# Patient Record
Sex: Male | Born: 1970 | Race: White | Hispanic: No | Marital: Married | Smoking: Former smoker
Health system: Southern US, Community
[De-identification: ages and names within clinical notes are randomized; demographics above are authoritative.]

## PROBLEM LIST (undated history)

## (undated) ENCOUNTER — Emergency Department (HOSPITAL_COMMUNITY): Admission: EM | Disposition: A | Discharge status: Other Acute Inpatient Hospital | Payer: BC Managed Care – PPO

## (undated) DIAGNOSIS — E119 Type 2 diabetes mellitus without complications: Secondary | ICD-10-CM

## (undated) DIAGNOSIS — I10 Essential (primary) hypertension: Secondary | ICD-10-CM

## (undated) DIAGNOSIS — M109 Gout, unspecified: Secondary | ICD-10-CM

## (undated) DIAGNOSIS — D229 Melanocytic nevi, unspecified: Secondary | ICD-10-CM

## (undated) DIAGNOSIS — G4730 Sleep apnea, unspecified: Secondary | ICD-10-CM

## (undated) DIAGNOSIS — E669 Obesity, unspecified: Secondary | ICD-10-CM

## (undated) DIAGNOSIS — F329 Major depressive disorder, single episode, unspecified: Secondary | ICD-10-CM

## (undated) DIAGNOSIS — R011 Cardiac murmur, unspecified: Secondary | ICD-10-CM

## (undated) DIAGNOSIS — E785 Hyperlipidemia, unspecified: Secondary | ICD-10-CM

## (undated) DIAGNOSIS — D352 Benign neoplasm of pituitary gland: Secondary | ICD-10-CM

## (undated) DIAGNOSIS — F419 Anxiety disorder, unspecified: Secondary | ICD-10-CM

## (undated) DIAGNOSIS — F32A Depression, unspecified: Secondary | ICD-10-CM

## (undated) HISTORY — PX: CHOLECYSTECTOMY: SHX55

## (undated) HISTORY — PX: UVULOPALATOPHARYNGOPLASTY: SHX827

## (undated) HISTORY — DX: Hyperlipidemia, unspecified: E78.5

## (undated) HISTORY — PX: WISDOM TOOTH EXTRACTION: SHX21

## (undated) HISTORY — PX: TONSILLECTOMY AND ADENOIDECTOMY: SHX28

## (undated) HISTORY — PX: TONSILLECTOMY: SUR1361

## (undated) HISTORY — DX: Melanocytic nevi, unspecified: D22.9

---

## 1999-03-14 ENCOUNTER — Encounter: Payer: Self-pay | Admitting: Endocrinology

## 1999-03-14 ENCOUNTER — Ambulatory Visit (HOSPITAL_COMMUNITY): Admission: RE | Admit: 1999-03-14 | Discharge: 1999-03-14 | Payer: Self-pay | Admitting: Endocrinology

## 1999-09-10 ENCOUNTER — Ambulatory Visit: Admission: RE | Admit: 1999-09-10 | Discharge: 1999-09-10 | Payer: Self-pay | Admitting: Endocrinology

## 2000-01-09 ENCOUNTER — Encounter: Admission: RE | Admit: 2000-01-09 | Discharge: 2000-04-08 | Payer: Self-pay | Admitting: Pulmonary Disease

## 2000-05-15 ENCOUNTER — Encounter: Payer: Self-pay | Admitting: Otolaryngology

## 2000-05-16 ENCOUNTER — Ambulatory Visit (HOSPITAL_COMMUNITY): Admission: RE | Admit: 2000-05-16 | Discharge: 2000-05-17 | Payer: Self-pay | Admitting: Otolaryngology

## 2000-08-05 ENCOUNTER — Ambulatory Visit (HOSPITAL_BASED_OUTPATIENT_CLINIC_OR_DEPARTMENT_OTHER): Admission: RE | Admit: 2000-08-05 | Discharge: 2000-08-05 | Payer: Self-pay | Admitting: Otolaryngology

## 2001-12-07 ENCOUNTER — Encounter: Payer: Self-pay | Admitting: Endocrinology

## 2001-12-07 ENCOUNTER — Ambulatory Visit (HOSPITAL_COMMUNITY): Admission: RE | Admit: 2001-12-07 | Discharge: 2001-12-07 | Payer: Self-pay | Admitting: Endocrinology

## 2003-04-13 ENCOUNTER — Ambulatory Visit (HOSPITAL_COMMUNITY): Admission: RE | Admit: 2003-04-13 | Discharge: 2003-04-13 | Payer: Self-pay | Admitting: Endocrinology

## 2004-06-21 ENCOUNTER — Ambulatory Visit: Payer: Self-pay | Admitting: Internal Medicine

## 2005-04-14 ENCOUNTER — Ambulatory Visit (HOSPITAL_COMMUNITY): Admission: RE | Admit: 2005-04-14 | Discharge: 2005-04-14 | Payer: Self-pay | Admitting: Endocrinology

## 2007-07-25 ENCOUNTER — Emergency Department: Payer: Self-pay | Admitting: Emergency Medicine

## 2007-11-19 ENCOUNTER — Ambulatory Visit: Payer: Self-pay | Admitting: Endocrinology

## 2007-12-16 ENCOUNTER — Ambulatory Visit: Payer: Self-pay | Admitting: Family Medicine

## 2008-02-22 ENCOUNTER — Ambulatory Visit: Payer: Self-pay | Admitting: Emergency Medicine

## 2008-02-22 DIAGNOSIS — D352 Benign neoplasm of pituitary gland: Secondary | ICD-10-CM | POA: Insufficient documentation

## 2008-02-22 DIAGNOSIS — G4733 Obstructive sleep apnea (adult) (pediatric): Secondary | ICD-10-CM | POA: Insufficient documentation

## 2008-02-22 DIAGNOSIS — Z85858 Personal history of malignant neoplasm of other endocrine glands: Secondary | ICD-10-CM | POA: Insufficient documentation

## 2008-02-22 DIAGNOSIS — I10 Essential (primary) hypertension: Secondary | ICD-10-CM | POA: Insufficient documentation

## 2008-02-22 DIAGNOSIS — G4730 Sleep apnea, unspecified: Secondary | ICD-10-CM | POA: Insufficient documentation

## 2008-03-09 ENCOUNTER — Encounter: Payer: Self-pay | Admitting: Emergency Medicine

## 2008-03-21 ENCOUNTER — Encounter: Payer: Self-pay | Admitting: Emergency Medicine

## 2008-04-20 ENCOUNTER — Encounter: Payer: Self-pay | Admitting: Emergency Medicine

## 2008-08-09 ENCOUNTER — Ambulatory Visit: Payer: Self-pay | Admitting: Gastroenterology

## 2008-09-26 ENCOUNTER — Ambulatory Visit: Payer: Self-pay | Admitting: Surgery

## 2010-07-26 ENCOUNTER — Other Ambulatory Visit: Payer: Self-pay | Admitting: Endocrinology

## 2010-07-26 DIAGNOSIS — D497 Neoplasm of unspecified behavior of endocrine glands and other parts of nervous system: Secondary | ICD-10-CM

## 2010-08-01 ENCOUNTER — Other Ambulatory Visit: Payer: Self-pay | Admitting: Endocrinology

## 2010-08-01 DIAGNOSIS — D497 Neoplasm of unspecified behavior of endocrine glands and other parts of nervous system: Secondary | ICD-10-CM

## 2010-08-07 ENCOUNTER — Ambulatory Visit
Admission: RE | Admit: 2010-08-07 | Discharge: 2010-08-07 | Disposition: A | Discharge status: Home / Self Care | Payer: 59 | Source: Ambulatory Visit | Attending: Endocrinology | Admitting: Endocrinology

## 2010-08-07 DIAGNOSIS — D497 Neoplasm of unspecified behavior of endocrine glands and other parts of nervous system: Secondary | ICD-10-CM

## 2010-08-07 MED ORDER — GADOBENATE DIMEGLUMINE 529 MG/ML IV SOLN
10.0000 mL | Freq: Once | INTRAVENOUS | Status: AC | PRN
  Administered 2010-08-07: 10 mL via INTRAVENOUS

## 2010-09-14 NOTE — Op Note (Signed)
[place]. [facility]  Patient:    Jacob [name], Jacob [name]                    MRN: [id number] Proc. Date: 05/16/00 Adm. Date:  [id number] Disc. Date: [id number] Attending:  Barbee Cough                           Operative Report  PREOPERATIVE DIAGNOSES: 1. Obstructive sleep apnea. 2. Chronic nasal obstruction. 3. Deviated nasal septum. 4. Uvulopalatal hypertrophy.  POSTOPERATIVE DIAGNOSES: 1. Obstructive sleep apnea. 2. Chronic nasal obstruction. 3. Deviated nasal septum. 4. Uvulopalatal hypertrophy.  SURGICAL PROCEDURE: 1. Uvulopalatal pharyngoplasty. 2. Nasal septoplasty. 3. Bilateral inferior turbinate intramural cautery.  ANESTHESIA:  General endotracheal.  SURGEON:  Kinnie Scales. Annalee Genta, M.D.  COMPLICATIONS:  None.  ESTIMATED BLOOD LOSS:  Minimal.  DISPOSITION:  The patient transferred from the operating room to the recovery room in stable condition.  BRIEF HISTORY:  The patient is a 40 year old white male referred by Dr. Billy Fischer for surgical evaluation of obstructive sleep apnea.  The patient had a longstanding history of progressive nighttime snoring, and an inpatient sleep study was performed which showed an RDI of 28 ______ per hour with O2 nadir of 79%.  The patient was titrated on nasal CPAP to a pressure setting of 8 cmH2O.  He used this consistently over a three to four month period of time, but had significant difficulty with complaints of discomfort, pressure, and claustrophobia while using the device.  Examination in my office revealed a deviated nasal septum with nasal obstruction, turbinate hypertrophy.  The patient was also found to have uvulopalatal hypertrophy and to be status post tonsillectomy.  Given the patients history, examination, and failure to tolerate long-term CPAP, I recommended that we undertake surgical management of obstructive sleep apnea.  The above procedures were discussed in detail and the risks,  benefits, and possible complications were discussed with the patient and his wife, who understood and concurred with our plan for surgery.  DESCRIPTION OF PROCEDURE:  The patient was brought to the operating room on May 16, 2000, and placed in the supine position on the operating table. General endotracheal anesthesia was established without difficulty.  When the patient was adequately anesthetized, the nasal cavity was injected with 8 cc of 1% lidocaine, 1:100,000 solution epinephrine.  It was injected in a submucosal fashion along the nasal septum and inferior turbinates bilaterally, and the patients nose packed with Afrin soaked cottonoid pledgets.  The patient was then prepped and draped in a sterile fashion.  Pledgets were left intact for approximately 10 minutes to allow for vasoconstriction.  The surgical procedure was begun by creating a right hemitransfixion incision. This was carried through the mucosa and underlying submucosa, and a mucoperichondrial flap was elevated from anterior to posterior.  The patient was found to have a significantly deviated septum with inferior septal spurring and obstruction.  The bony cartilaginous junction was crossed at the midline and a mucoperiosteal flap was elevated in the posterior aspect of the left nasal septum.  The anterior nasal septal cartilage was left intact.  The mid septal cartilage was resected using a swivel knife.  This was morcellized and returned to the mucoperichondrial pocket at the end of the surgical procedure.  Deviated bone and cartilage and inferior septal spur were resected using a 4 mm osteotome.  The patients nasal septum was midline and morcellized cartilage was returned to the mucoperichondrial  pocket, and the mucoperichondrial flaps were reapproximated using a 4-0 gut suture on a Keith needle in a horizontal mattressing fashion.  A hemitransfixion incision was placed close to the same stitch.  The nasal cavity  was then irrigated and bilateral nasal septal splints were placed.  Doyle splints were placed on each side of the nasal septum after application of Bactroban ointment and sutured into position with a 3-0 Ethilon suture.  Inferior turbinate intramural cautery was then performed with the cautery set at 12 watts.  Two passes were made in the submucosal fascia in each inferior turbinate.  When the ________ had been adequately cauterized, they were outfractured and appeared a more patent nasal cavity.  Uvulopalatopharyngoplasty was then performed.  A Crowe-Davis mouthgag was inserted without difficulty.  There were no loose or broken teeth and the hard and soft palate were intact.  The patient had undergone previous tonsillectomy.  There was moderate hypertrophy at the palate and uvula as well as posterior tonsillar pillar.  Using the harmonic scalpel, dissection was carried along the anterior aspect of the patients soft palate, removing approximately 1 cm of palatal tissue including the uvula across the palatal margin.  This included mucosa and muscular layer.  The posterior mucosal layer was preserved to create a mucosal flap.  The dissection was then carried along the anterior tonsillar pillars, removing mucosa from the region.  The uvulopalatopharyngoplasty was reconstructed using 3-0 Vicryl suture on a tapered needle in a horizontal mattressing fashion in interrupted sutures in order to advance the posterior tonsillar pillars anteriorly, and close the mucosal flap.  This was performed all the way across the patients palatal margin.  The patients nasal cavity, nasopharynx, oral cavity and oropharynx were then thoroughly irrigated and suctioned.  An orogastric tube was passed and the stomach contents were aspirated.  The Crowe-Davis mouthgag was released and reapplied.  There was no active bleeding.  The Crowe-Davis mouthgag was removed and there were no loose or broken teeth.  The  patient was awakened from the anesthetic, extubated, and then transferred from the operating room to the recovery room in stable condition. DD:  05/16/00 TD:  05/18/00 Job: [id number]  [id number]

## 2010-09-14 NOTE — Discharge Summary (Signed)
[place]  Patient:    Jacob [name], Jacob [name]                    MRN: [id number] Adm. Date:  [id number] Disc. Date: [id number] Attending:  Barbee Cough                           Discharge Summary  ADMISSION DIAGNOSES: 1. Obstructive sleep apnea. 2. Nasal septal deviation with nasal obstruction. 3. Uvulopalatal hypertrophy.  DISCHARGE DIAGNOSES: 1. Obstructive sleep apnea. 2. Nasal septal deviation with nasal obstruction. 3. Uvulopalatal hypertrophy.  PROCEDURES: 1. Uvulopalatopharyngoplasty. 2. Nasal septoplasty. 3. Bilateral inferior turbinate intramural cautery.  All surgeries performed on May 16, 2000.  DISPOSITION:  The patient is discharged to home on May 17, 2000, in the company of his family.  CONDITION ON DISCHARGE:  Stable.  DISCHARGE MEDICATIONS: 1. Preoperative medications, which are:    a. Parlodel 2.5 mg p.o. q.d.    b. Vioxx 50 mg p.o. q.d. 2. Additional discharge medications include:    a. Augmentin elixir 400 mg/5 cc 2 teaspoons p.o. b.i.d. for 10 days.    b. Lortab elixir 2-3 teaspoons every four to six hours as needed for pain,       dispense 300 cc, no refills.  ACTIVITY:  Limited to no lifting, straining, or nose-blowing.  DIET:  Clear liquids and soft diet as tolerated.  WOUND CARE:  Frequent nasal saline irrigation, 4 sprays per nostril every hour while awake in the postoperative period.  FOLLOW-UP:  Scheduled for May 22, 2000, in my office, or sooner if needed.  HISTORY OF PRESENT ILLNESS:  Jacob [name] is a 40 year old white male referred for evaluation of moderately severe obstructive sleep apnea.  The patients inpatient sleep study performed prior to admission included an RDI in the mid 20s with an O2 nadir in the high 80s.  The patient was titrated on nasal CPAP but was unable to tolerate this treatment for long-term management of sleep apnea and was thus referred to our office for  reevaluation and surgical treatment.  Based on his physical examination, which showed nasal obstruction secondary to deviated nasal septum and uvulopalatal hypertrophy, I recommended we consider him for the above surgical procedures.  Prior to surgery, informed consent was obtained from the patient and his family, who understood and concurred with our plan for surgery.  HOSPITAL COURSE:  Jacob [name] was admitted to the ENT service at [facility] on May 16, 2000, under Dr. Clovis Pu care.  He was taken to the operating room and underwent uvulopalatopharyngoplasty, nasal septoplasty, and inferior turbinate reduction under general anesthesia.  The surgical procedure was uneventful, and the patient was transferred from the operating room to the recovery room in stable condition.  He was then transferred from recovery to Unit 2[address] for postoperative monitoring and care.  Given the patients significant obstructive sleep apnea, postoperative monitoring included continuous pulse oximetry and cardiac monitoring performed over a 23-hour period after the surgery.  The patient had no significant problems with oxygenation.  His O2 nadir was 90% on humidified room air, and his vital signs were stable throughout the night.  He was tolerating liquids and soft oral diet.  He was ambulating and had normal bowel and bladder function. Examination revealed minimal bloody mucoid discharge and bilateral nasal septal splints which were intact.  The palatal sutures were intact, and there was no evidence of active bleeding, significant postoperative  swelling, or infection.  The patient was discharged to home in stable condition with the above discharge instructions in the company of his wife.  They understand and concur with our discharge planning and will follow up in my office in one week for postoperative care. DD:  05/17/00 TD:  05/19/00 Job: [id number]

## 2011-02-07 ENCOUNTER — Emergency Department: Payer: Self-pay | Admitting: *Deleted

## 2012-04-02 ENCOUNTER — Ambulatory Visit: Payer: Self-pay | Admitting: Internal Medicine

## 2013-03-17 DIAGNOSIS — G43909 Migraine, unspecified, not intractable, without status migrainosus: Secondary | ICD-10-CM | POA: Insufficient documentation

## 2013-04-01 ENCOUNTER — Ambulatory Visit: Payer: Self-pay | Admitting: Internal Medicine

## 2013-05-07 DIAGNOSIS — E1169 Type 2 diabetes mellitus with other specified complication: Secondary | ICD-10-CM | POA: Insufficient documentation

## 2013-05-07 DIAGNOSIS — H9190 Unspecified hearing loss, unspecified ear: Secondary | ICD-10-CM | POA: Insufficient documentation

## 2013-07-01 ENCOUNTER — Ambulatory Visit: Payer: Self-pay

## 2013-07-01 LAB — CBC WITH DIFFERENTIAL/PLATELET
Basophil #: 0.1 10*3/uL (ref 0.0–0.1)
Basophil %: 1.1 %
EOS ABS: 0.5 10*3/uL (ref 0.0–0.7)
Eosinophil %: 4.5 %
HCT: 39.5 % — ABNORMAL LOW (ref 40.0–52.0)
HGB: 13.3 g/dL (ref 13.0–18.0)
Lymphocyte #: 2.9 10*3/uL (ref 1.0–3.6)
Lymphocyte %: 26.6 %
MCH: 29.9 pg (ref 26.0–34.0)
MCHC: 33.7 g/dL (ref 32.0–36.0)
MCV: 89 fL (ref 80–100)
MONO ABS: 0.8 x10 3/mm (ref 0.2–1.0)
MONOS PCT: 7 %
NEUTROS ABS: 6.7 10*3/uL — AB (ref 1.4–6.5)
NEUTROS PCT: 60.8 %
Platelet: 221 10*3/uL (ref 150–440)
RBC: 4.46 10*6/uL (ref 4.40–5.90)
RDW: 13.3 % (ref 11.5–14.5)
WBC: 11 10*3/uL — ABNORMAL HIGH (ref 3.8–10.6)

## 2013-07-01 LAB — COMPREHENSIVE METABOLIC PANEL
Albumin: 3.5 g/dL (ref 3.4–5.0)
Alkaline Phosphatase: 109 U/L
Anion Gap: 10 (ref 7–16)
BUN: 9 mg/dL (ref 7–18)
Bilirubin,Total: 0.7 mg/dL (ref 0.2–1.0)
CALCIUM: 8.5 mg/dL (ref 8.5–10.1)
CREATININE: 0.89 mg/dL (ref 0.60–1.30)
Chloride: 106 mmol/L (ref 98–107)
Co2: 28 mmol/L (ref 21–32)
EGFR (African American): >60
Glucose: 127 mg/dL — ABNORMAL HIGH (ref 65–99)
Osmolality: 287 (ref 275–301)
POTASSIUM: 3.6 mmol/L (ref 3.5–5.1)
SGOT(AST): 22 U/L (ref 15–37)
SGPT (ALT): 55 U/L (ref 12–78)
SODIUM: 144 mmol/L (ref 136–145)
Total Protein: 8.1 g/dL (ref 6.4–8.2)

## 2013-07-01 LAB — URIC ACID: URIC ACID: 5.8 mg/dL (ref 3.5–7.2)

## 2013-11-05 DIAGNOSIS — M722 Plantar fascial fibromatosis: Secondary | ICD-10-CM | POA: Insufficient documentation

## 2014-01-30 ENCOUNTER — Ambulatory Visit: Payer: Self-pay | Admitting: Internal Medicine

## 2014-02-23 DIAGNOSIS — M10072 Idiopathic gout, left ankle and foot: Secondary | ICD-10-CM | POA: Insufficient documentation

## 2014-04-07 ENCOUNTER — Emergency Department: Payer: Self-pay | Admitting: Emergency Medicine

## 2014-04-07 LAB — CBC
HCT: 51.8 % (ref 40.0–52.0)
HGB: 17.4 g/dL (ref 13.0–18.0)
MCH: 30.9 pg (ref 26.0–34.0)
MCHC: 33.7 g/dL (ref 32.0–36.0)
MCV: 92 fL (ref 80–100)
PLATELETS: 381 10*3/uL (ref 150–440)
RBC: 5.64 10*6/uL (ref 4.40–5.90)
RDW: 13.7 % (ref 11.5–14.5)
WBC: 22.1 10*3/uL — ABNORMAL HIGH (ref 3.8–10.6)

## 2014-04-07 LAB — COMPREHENSIVE METABOLIC PANEL
ALBUMIN: 3.7 g/dL (ref 3.4–5.0)
ANION GAP: 10 (ref 7–16)
Alkaline Phosphatase: 118 U/L — ABNORMAL HIGH
BUN: 16 mg/dL (ref 7–18)
Bilirubin,Total: 0.9 mg/dL (ref 0.2–1.0)
CALCIUM: 8.8 mg/dL (ref 8.5–10.1)
CO2: 22 mmol/L (ref 21–32)
Chloride: 108 mmol/L — ABNORMAL HIGH (ref 98–107)
Creatinine: 1.51 mg/dL — ABNORMAL HIGH (ref 0.60–1.30)
GFR CALC NON AF AMER: 54 — AB
Glucose: 146 mg/dL — ABNORMAL HIGH (ref 65–99)
OSMOLALITY: 283 (ref 275–301)
Potassium: 3.8 mmol/L (ref 3.5–5.1)
SGOT(AST): 33 U/L (ref 15–37)
SGPT (ALT): 65 U/L — ABNORMAL HIGH
Sodium: 140 mmol/L (ref 136–145)
Total Protein: 8.5 g/dL — ABNORMAL HIGH (ref 6.4–8.2)

## 2014-04-07 LAB — LIPASE, BLOOD: Lipase: 188 U/L (ref 73–393)

## 2014-04-28 ENCOUNTER — Emergency Department: Payer: Self-pay | Admitting: Emergency Medicine

## 2014-04-28 LAB — URINALYSIS, COMPLETE
Bacteria: NONE SEEN
Bilirubin,UR: NEGATIVE
Glucose,UR: 50 mg/dL (ref 0–75)
Hyaline Cast: 3
Ketone: NEGATIVE
Leukocyte Esterase: NEGATIVE
Nitrite: NEGATIVE
PH: 5 (ref 4.5–8.0)
Protein: 100
RBC,UR: 168 /HPF (ref 0–5)
Specific Gravity: 1.02 (ref 1.003–1.030)
Squamous Epithelial: NONE SEEN
WBC UR: 20 /HPF (ref 0–5)

## 2014-04-29 LAB — GC/CHLAMYDIA PROBE AMP

## 2014-05-03 ENCOUNTER — Ambulatory Visit: Payer: Self-pay | Admitting: Family Medicine

## 2014-11-01 ENCOUNTER — Ambulatory Visit
Admission: EM | Admit: 2014-11-01 | Discharge: 2014-11-01 | Disposition: A | Discharge status: Home / Self Care | Payer: BC Managed Care – PPO | Attending: Family Medicine | Admitting: Family Medicine

## 2014-11-01 DIAGNOSIS — H6121 Impacted cerumen, right ear: Secondary | ICD-10-CM

## 2014-11-01 DIAGNOSIS — H6501 Acute serous otitis media, right ear: Secondary | ICD-10-CM

## 2014-11-01 HISTORY — DX: Sleep apnea, unspecified: G47.30

## 2014-11-01 HISTORY — DX: Major depressive disorder, single episode, unspecified: F32.9

## 2014-11-01 HISTORY — DX: Gout, unspecified: M10.9

## 2014-11-01 HISTORY — DX: Type 2 diabetes mellitus without complications: E11.9

## 2014-11-01 HISTORY — DX: Depression, unspecified: F32.A

## 2014-11-01 HISTORY — DX: Essential (primary) hypertension: I10

## 2014-11-01 MED ORDER — AMOXICILLIN 875 MG PO TABS: 875.0000 mg | ORAL_TABLET | Freq: Two times a day (BID) | ORAL | Status: DC

## 2014-11-01 NOTE — ED Provider Notes (Signed)
CSN: [id number]     Arrival date & time 11/01/14  1601 History   First MD Initiated Contact with Patient 11/01/14 1640     Chief Complaint  Patient presents with  . Otalgia   (Consider location/radiation/quality/duration/timing/severity/associated sxs/prior Treatment) HPI Comments: 44 yo [name] with a 1 day h/o right ear fullness and discomfort. Denies any drainage, fevers, chills. States has a prior h/o chronic ear infections.   The history is provided by the patient.    Past Medical History  Diagnosis Date  . Gout   . Diabetes   . Hypertension   . Depression   . Sleep apnea     uses cpap   Past Surgical History  Procedure Laterality Date  . Tonsillectomy and adenoidectomy    . Uvulopalatopharyngoplasty    . Cholecystectomy     Family History  Problem Relation Age of Onset  . Lung cancer Father    History  Substance Use Topics  . Smoking status: Former Smoker -- 1.00 packs/day for 10 years    Types: Cigarettes    Quit date: 04/29/1993  . Smokeless tobacco: Current User    Types: Chew  . Alcohol Use: 0.0 oz/week    0 Standard drinks or equivalent per week     Comment: rarely    Review of Systems  Allergies  Hydrocortisone  Home Medications   Prior to Admission medications   Medication Sig Start Date End Date Taking? Authorizing Provider  allopurinol (ZYLOPRIM) 100 MG tablet Take 100 mg by mouth daily.   Yes Historical Provider, MD  FLUoxetine (PROZAC) 20 MG capsule Take 20 mg by mouth daily.   Yes Historical Provider, MD  lisinopril (PRINIVIL,ZESTRIL) 5 MG tablet Take 5 mg by mouth daily.   Yes Historical Provider, MD  metFORMIN (GLUMETZA) 500 MG (MOD) 24 hr tablet Take 500 mg by mouth daily with breakfast.   Yes Historical Provider, MD  amoxicillin (AMOXIL) 875 MG tablet Take 1 tablet (875 mg total) by mouth 2 (two) times daily. 11/01/14   Norval Gable, MD   BP 153/87 mmHg  Pulse 81  Temp(Src) 99.4 F (37.4 C) (Tympanic)  Resp 16  Ht 6' (1.829 m)  Wt 350  lb (158.759 kg)  BMI 47.46 kg/m2  SpO2 97% Physical Exam  Constitutional: He appears well-developed and well-nourished. No distress.  HENT:  Head: Normocephalic.  Right Ear: Tympanic membrane is injected and bulging. A middle ear effusion is present.  Left Ear: External ear and ear canal normal.  Mouth/Throat: No oropharyngeal exudate.  Cerumen impaction of right ear canal; TM visualized after cerumen disimpaction  Eyes: Conjunctivae are normal. Right eye exhibits no discharge. Left eye exhibits no discharge.  Skin: He is not diaphoretic.  Nursing note and vitals reviewed.   ED Course  Procedures (including critical care time) Labs Review Labs Reviewed - No data to display  Imaging Review No results found.   MDM   1. Right acute serous otitis media, recurrence not specified   2. Cerumen impaction, right    Discharge Medication List as of 11/01/2014  4:54 PM    START taking these medications   Details  amoxicillin (AMOXIL) 875 MG tablet Take 1 tablet (875 mg total) by mouth 2 (two) times daily., Starting 11/01/2014, Until Discontinued, Normal      Plan: 1. diagnosis reviewed with patient 2. rx as per orders; risks, benefits, potential side effects reviewed with patient 3. Recommend supportive treatment with otc analgesics 4. Cerumen disimpacted with ear lavage 5.  F/u prn if symptoms worsen or don't improve    Norval Gable, MD 11/01/14 [phone]

## 2014-11-01 NOTE — ED Notes (Signed)
Patient complains of right ear pain. Patient states that he was swimming yesterday and he felt it clog. He thinks it may be clogged with wax. States that pain runs down his neck.

## 2015-03-05 ENCOUNTER — Ambulatory Visit
Admission: EM | Admit: 2015-03-05 | Discharge: 2015-03-05 | Disposition: A | Discharge status: Home / Self Care | Payer: BC Managed Care – PPO | Attending: Internal Medicine | Admitting: Internal Medicine

## 2015-03-05 ENCOUNTER — Encounter: Payer: Self-pay | Admitting: *Deleted

## 2015-03-05 DIAGNOSIS — T798XXA Other early complications of trauma, initial encounter: Secondary | ICD-10-CM | POA: Diagnosis not present

## 2015-03-05 MED ORDER — CEPHALEXIN 500 MG PO CAPS: 500.0000 mg | ORAL_CAPSULE | Freq: Two times a day (BID) | ORAL | Status: DC

## 2015-03-05 MED ORDER — SULFAMETHOXAZOLE-TRIMETHOPRIM 800-160 MG PO TABS: 1.0000 | ORAL_TABLET | Freq: Two times a day (BID) | ORAL | Status: AC

## 2015-03-05 NOTE — ED Notes (Signed)
Patient had a skin cancer removed in office on 01/30/15 by Dr Phillip Heal on lower left leg lateral. Wound has become red, swollen, and is presently draining.

## 2015-03-05 NOTE — ED Provider Notes (Signed)
CSN: [id number]     Arrival date & time 03/05/15  1145 History   First MD Initiated Contact with Patient 03/05/15 1329     Chief Complaint  Patient presents with  . Wound Dehiscence   HPI  Patient is a 44 year old gentleman with glucose intolerance, who had a skin cancer removed from the left lateral lower leg on October 3. For the last couple weeks, the site has been itching, but he can't see it very well. Today, there was some bleeding from the site, and the patient's wife inspected and observed some redness. He is here to have this evaluated. No fever, no malaise, feeling fine otherwise.  Past Medical History  Diagnosis Date  . Gout   . Diabetes ([place])   . Hypertension   . Depression   . Sleep apnea     uses cpap   Past Surgical History  Procedure Laterality Date  . Tonsillectomy and adenoidectomy    . Uvulopalatopharyngoplasty    . Cholecystectomy     Family History  Problem Relation Age of Onset  . Lung cancer Father    Social History  Substance Use Topics  . Smoking status: Former Smoker -- 1.00 packs/day for 10 years    Types: Cigarettes    Quit date: 04/29/1993  . Smokeless tobacco: Current User    Types: Chew  . Alcohol Use: 0.0 oz/week    0 Standard drinks or equivalent per week     Comment: rarely    Review of Systems  All other systems reviewed and are negative.   Allergies  Hydrocortisone  (Topicals)  Home Medications   Prior to Admission medications   Medication Sig Start Date End Date Taking? Authorizing Provider  allopurinol (ZYLOPRIM) 100 MG tablet Take 100 mg by mouth daily.   Yes Historical Provider, MD  FLUoxetine (PROZAC) 20 MG capsule Take 20 mg by mouth daily.   Yes Historical Provider, MD  lisinopril (PRINIVIL,ZESTRIL) 5 MG tablet Take 5 mg by mouth daily.   Yes Historical Provider, MD  metFORMIN (GLUMETZA) 500 MG (MOD) 24 hr tablet Take 500 mg by mouth daily with breakfast.   Yes Historical Provider, MD                         BP  133/78 mmHg  Pulse 71  Temp(Src) 98 F (36.7 C) (Oral)  Resp 18  Ht 6' (1.829 m)  Wt 365 lb (165.563 kg)  BMI 49.49 kg/m2  SpO2 96%   Physical Exam  Constitutional: He is oriented to person, place, and time. No distress.  Alert, nicely groomed  HENT:  Head: Atraumatic.  Eyes:  Conjugate gaze, no eye redness/drainage  Neck: Neck supple.  Cardiovascular: Normal rate.   Pulmonary/Chest: No respiratory distress.  Abdominal: He exhibits no distension.  Musculoskeletal: Normal range of motion.  No significant pitting edema in the lower extremities  Neurological: He is alert and oriented to person, place, and time.  Skin: Skin is warm and dry.  Pink. No cyanosis On the mid left lateral lower leg there is a 2 cm open wound with moderate amount of necrotic debris adherent; no underlying fluctuance, no purulence expressible. Minimal induration. There is a surrounding zone of blanching erythema of 6 cm around the open wound.  Nursing note and vitals reviewed.   ED Course  Procedures (including critical care time)     MDM   1. Infected wound, initial encounter    Discharge Medication List as  of 03/05/2015  1:51 PM    START taking these medications   Details  cephALEXin (KEFLEX) 500 MG capsule Take 1 capsule (500 mg total) by mouth 2 (two) times daily., Starting 03/05/2015, Until Discontinued, Normal    sulfamethoxazole-trimethoprim (BACTRIM DS,SEPTRA DS) 800-160 MG tablet Take 1 tablet by mouth 2 (two) times daily., Starting 03/05/2015, Until Sun 03/12/15, Normal       followup with [facility] wound care center, call for next available appointment. Wash wound with soap/water 1-2x daily, apply abx ointment/bandage.    Sherlene Shams, MD 03/05/15 [phone]

## 2015-03-05 NOTE — Discharge Instructions (Signed)
Prescriptions for bactrim (sulfamethoxazole/trimethoprim, antibiotic) and keflex (cephalexin, antibiotic) were sent to [facility] in [place]. Wash wound 1-2x daily with soap/water (gently), pat dry, apply antibiotic ointment/bandage. Followup with [facility] wound center (phone number is listed above).  Wound Infection A wound infection happens when a type of germ (bacteria) starts growing in the wound. In some cases, this can cause the wound to break open. If cared for properly, the infected wound will heal from the inside to the outside. Wound infections need treatment. CAUSES An infection is caused by bacteria growing in the wound.  SYMPTOMS   Increase in redness, swelling, or pain at the wound site.  Increase in drainage at the wound site.  Wound or bandage (dressing) starts to smell bad.  Fever.  Feeling tired or fatigued.  Pus draining from the wound. TREATMENT  Your health care provider will prescribe antibiotic medicine. The wound infection should improve within 24 to 48 hours. Any redness around the wound should stop spreading and the wound should be less painful.  HOME CARE INSTRUCTIONS   Only take over-the-counter or prescription medicines for pain, discomfort, or fever as directed by your health care provider.  Take your antibiotics as directed. Finish them even if you start to feel better.  Gently wash the area with mild soap and water 2 times a day, or as directed. Rinse off the soap. Pat the area dry with a clean towel. Do not rub the wound. This may cause bleeding.  Follow your health care provider's instructions for how often you need to change the dressing.  Apply ointment and a dressing to the wound as directed.  If the dressing sticks, moisten it with soapy water and gently remove it.  Change the bandage right away if it becomes wet, dirty, or develops a bad smell.  Take showers. Do not take tub baths, swim, or do anything that may soak the wound until it is  healed.  Avoid exercises that make you sweat heavily.  Use anti-itch medicine as directed by your health care provider. The wound may itch when it is healing. Do not pick or scratch at the wound.  Follow up with your health care provider to get your wound rechecked as directed. SEEK MEDICAL CARE IF:  You have an increase in swelling, pain, or redness around the wound.  You have an increase in the amount of pus coming from the wound.  There is a bad smell coming from the wound.  More of the wound breaks open.  You have a fever. MAKE SURE YOU:   Understand these instructions.  Will watch your condition.  Will get help right away if you are not doing well or get worse.   This information is not intended to replace advice given to you by your health care provider. Make sure you discuss any questions you have with your health care provider.   Document Released: 01/12/2003 Document Revised: 04/20/2013 Document Reviewed: 10/03/2014 Elsevier Interactive Patient Education [employer].

## 2015-03-14 ENCOUNTER — Encounter: Payer: BC Managed Care – PPO | Attending: Surgery | Admitting: Surgery

## 2015-03-14 DIAGNOSIS — I10 Essential (primary) hypertension: Secondary | ICD-10-CM | POA: Insufficient documentation

## 2015-03-14 DIAGNOSIS — F17228 Nicotine dependence, chewing tobacco, with other nicotine-induced disorders: Secondary | ICD-10-CM | POA: Insufficient documentation

## 2015-03-14 DIAGNOSIS — L97222 Non-pressure chronic ulcer of left calf with fat layer exposed: Secondary | ICD-10-CM | POA: Insufficient documentation

## 2015-03-14 DIAGNOSIS — G4730 Sleep apnea, unspecified: Secondary | ICD-10-CM | POA: Diagnosis not present

## 2015-03-14 DIAGNOSIS — T8131XA Disruption of external operation (surgical) wound, not elsewhere classified, initial encounter: Secondary | ICD-10-CM | POA: Insufficient documentation

## 2015-03-14 DIAGNOSIS — E11622 Type 2 diabetes mellitus with other skin ulcer: Secondary | ICD-10-CM | POA: Insufficient documentation

## 2015-03-14 DIAGNOSIS — X58XXXA Exposure to other specified factors, initial encounter: Secondary | ICD-10-CM | POA: Diagnosis not present

## 2015-03-14 DIAGNOSIS — M109 Gout, unspecified: Secondary | ICD-10-CM | POA: Diagnosis not present

## 2015-03-15 NOTE — Progress Notes (Signed)
Jacob [name] ([id number]) Visit Report for 03/14/2015 Abuse/Suicide Risk Screen Details Patient Name: [name], [name] Date of Service: 03/14/2015 12:45 PM Medical Record Number: [id number] Patient Account Number: [id number] Date of Birth/Sex: [birth year] (44 y.o. Male) Treating RN: Montey Hora Primary Care Physician: Gayland Curry Other Clinician: Referring Physician: Treating Physician/Extender: Frann Rider in Treatment: 0 Abuse/Suicide Risk Screen Items Answer ABUSE/SUICIDE RISK SCREEN: Has anyone close to you tried to hurt or harm you recentlyo No Do you feel uncomfortable with anyone in your familyo No Has anyone forced you do things that you didnot want to doo No Do you have any thoughts of harming yourselfo No Patient displays signs or symptoms of abuse and/or neglect. No Electronic Signature(s) Signed: 03/14/2015 5:09:58 PM By: Montey Hora Entered By: Montey Hora on 03/14/2015 13:06:59 Jacob [name] ([id number]) -------------------------------------------------------------------------------- Activities of Daily Living Details Patient Name: Jacob [name], [name] Date of Service: 03/14/2015 12:45 PM Medical Record Number: [id number] Patient Account Number: [id number] Date of Birth/Sex: January 02, 1971 (44 y.o. Male) Treating RN: Montey Hora Primary Care Physician: Gayland Curry Other Clinician: Referring Physician: Treating Physician/Extender: Frann Rider in Treatment: 0 Activities of Daily Living Items Answer Activities of Daily Living (Please select one for each item) Drive Automobile Completely Able Take Medications Completely Able Use Telephone Completely Able Care for Appearance Completely Able Use Toilet Completely Able Bath / Shower Completely Able Dress Self Completely Able Feed Self Completely Able Walk Completely Able Get In / Out Bed Completely Able Housework Completely Able Prepare Meals Completely [place] for Self Completely Able Electronic Signature(s) Signed: 03/14/2015 5:09:58 PM By: Montey Hora Entered By: Montey Hora on 03/14/2015 13:07:17 Jacob [name] ([id number]) -------------------------------------------------------------------------------- Education Assessment Details Patient Name: Jacob [name], [name] Date of Service: 03/14/2015 12:45 PM Medical Record Number: [id number] Patient Account Number: [id number] Date of Birth/Sex: 04-02-1971 (44 y.o. Male) Treating RN: Montey Hora Primary Care Physician: Gayland Curry Other Clinician: Referring Physician: Treating Physician/Extender: Frann Rider in Treatment: 0 Primary Learner Assessed: Patient Learning Preferences/Education Level/Primary Language Learning Preference: Explanation, Demonstration Highest Education Level: High School Preferred Language: English Cognitive Barrier Assessment/Beliefs Language Barrier: No Translator Needed: No Memory Deficit: No Emotional Barrier: No Cultural/Religious Beliefs Affecting Medical No Care: Physical Barrier Assessment Impaired Vision: No Impaired Hearing: No Decreased Hand dexterity: No Knowledge/Comprehension Assessment Knowledge Level: Medium Comprehension Level: Medium Ability to understand written Medium instructions: Ability to understand verbal Medium instructions: Motivation Assessment Anxiety Level: Calm Cooperation: Cooperative Education Importance: Acknowledges Need Interest in Health Problems: Asks Questions Perception: Coherent Willingness to Engage in Self- Medium Management Activities: Readiness to Engage in Self- Medium Management Activities: Electronic Signature(s) Jacob [name] ([id number]) Signed: 03/14/2015 5:09:58 PM By: Montey Hora Entered By: Montey Hora on 03/14/2015 13:07:43 Jacob [name]  ([id number]) -------------------------------------------------------------------------------- Fall Risk Assessment Details Patient Name: Jacob [name], [name] Date of Service: 03/14/2015 12:45 PM Medical Record Number: [id number] Patient Account Number: [id number] Date of Birth/Sex: 1971-01-30 (44 y.o. Male) Treating RN: Montey Hora Primary Care Physician: Gayland Curry Other Clinician: Referring Physician: Treating Physician/Extender: Frann Rider in Treatment: 0 Fall Risk Assessment Items FALL RISK ASSESSMENT: History of falling - immediate or within 3 months 0 No Secondary diagnosis 0 No Ambulatory aid None/bed rest/wheelchair/nurse 0 Yes Crutches/cane/walker 0 No Furniture 0 No IV Access/Saline Lock 0 No Gait/Training Normal/bed rest/immobile 0 Yes Weak 0 No Impaired 0 No Mental Status Oriented to own ability 0 Yes Electronic Signature(s) Signed: 03/14/2015 5:09:58 PM By: Montey Hora Entered By: Montey Hora  on 03/14/2015 13:07:48 Jacob [name] ([id number]) -------------------------------------------------------------------------------- Foot Assessment Details Patient Name: [name], [name] Date of Service: 03/14/2015 12:45 PM Medical Record Number: [id number] Patient Account Number: [id number] Date of Birth/Sex: [birth year] (44 y.o. Male) Treating RN: Montey Hora Primary Care Physician: Gayland Curry Other Clinician: Referring Physician: Treating Physician/Extender: Frann Rider in Treatment: 0 Foot Assessment Items Site Locations + = Sensation present, - = Sensation absent, C = Callus, U = Ulcer R = Redness, W = Warmth, M = Maceration, PU = Pre-ulcerative lesion F = Fissure, S = Swelling, D = Dryness Assessment Right: Left: Other Deformity: No No Prior Foot Ulcer: No No Prior Amputation: No No Charcot Joint: No No Ambulatory Status: Ambulatory Without Help Gait: Steady Electronic Signature(s) Signed: 03/14/2015 5:09:58  PM By: Montey Hora Entered By: Montey Hora on 03/14/2015 13:08:13 Jacob [name] ([id number]) -------------------------------------------------------------------------------- Nutrition Risk Assessment Details Patient Name: Jacob [name], [name] Date of Service: 03/14/2015 12:45 PM Medical Record Number: [id number] Patient Account Number: [id number] Date of Birth/Sex: 25-Mar-1971 (44 y.o. Male) Treating RN: Montey Hora Primary Care Physician: Gayland Curry Other Clinician: Referring Physician: Treating Physician/Extender: Frann Rider in Treatment: 0 Height (in): 72 Weight (lbs): 360 Body Mass Index (BMI): 48.8 Nutrition Risk Assessment Items NUTRITION RISK SCREEN: I have an illness or condition that made me change the kind and/or 0 No amount of food I eat I eat fewer than two meals per day 0 No I eat few fruits and vegetables, or milk products 0 No I have three or more drinks of beer, liquor or wine almost every day 0 No I have tooth or mouth problems that make it hard for me to eat 0 No I don't always have enough money to buy the food I need 0 No I eat alone most of the time 0 No I take three or more different prescribed or over-the-counter drugs a 1 Yes day Without wanting to, I have lost or gained 10 pounds in the last six 0 No months I am not always physically able to shop, cook and/or feed myself 0 No Nutrition Protocols Good Risk Protocol 0 No interventions needed Moderate Risk Protocol Electronic Signature(s) Signed: 03/14/2015 5:09:58 PM By: Montey Hora Entered By: Montey Hora on 03/14/2015 13:07:54

## 2015-03-15 NOTE — Progress Notes (Signed)
RIHAAN, BREESE ([id number]) Visit Report for 03/14/2015 Chief Complaint Document Details Patient Name: CZARNECKI, Benett A. Date of Service: 03/14/2015 12:45 PM Medical Record Number: [id number] Patient Account Number: [id number] Date of Birth/Sex: [birth year] (44 y.o. Male) Treating RN: Montey Hora Primary Care Physician: Gayland Curry Other Clinician: Referring Physician: Treating Physician/Extender: Frann Rider in Treatment: 0 Information Obtained from: Patient Chief Complaint Patient presents to the wound care center for a consult due non healing wound of the left lower extremity which he has had for about a month and a half. Electronic Signature(s) Signed: 03/14/2015 1:42:43 PM By: Christin Fudge MD, FACS Entered By: Christin Fudge on 03/14/2015 13:42:43 [name] ([id number]) -------------------------------------------------------------------------------- HPI Details Patient Name: [name], Jamael A. Date of Service: 03/14/2015 12:45 PM Medical Record Number: [id number] Patient Account Number: [id number] Date of Birth/Sex: January 21, 1971 (44 y.o. Male) Treating RN: Montey Hora Primary Care Physician: Gayland Curry Other Clinician: Referring Physician: Treating Physician/Extender: Frann Rider in Treatment: 0 History of Present Illness Location: left lower extremity laterally Quality: Patient reports experiencing a dull pain to affected area(s). Severity: Patient states wound (s) are getting better. Duration: Patient has had the wound for < 6 weeks prior to presenting for treatment Timing: Pain in wound is Intermittent (comes and goes Context: The wound occurred when the patient had a shave biopsy of a lesion on that leg which was benign Modifying Factors: Other treatment(s) tried include:was put on Keflex and Bactrim by his PCP Associated Signs and Symptoms: Patient reports presence of swelling HPI Description: Pleasant 44 year old  gentleman who had a shave biopsy done from his left lower extremity on October 3 and the wound has not healed since then. This was a benign disease and he was concerned about the length of time it has taken to heal. He has past medical history of diabetes mellitus, gout, hypertension, sleep apnea and is status post tonsillectomy and adenoidectomy, cholecystectomy and uvulopalatopharyngoplasty. he was recently seen by his PCP Dr. Jodi Mourning who had already given him Keflex for a week and then put him on Bactrim for 10 days. the patient was a smoker before and quit in 1995 but currently chews tobacco on a daily basis. Electronic Signature(s) Signed: 03/14/2015 1:46:21 PM By: Christin Fudge MD, FACS Entered By: Christin Fudge on 03/14/2015 13:46:20 [name] ([id number]) -------------------------------------------------------------------------------- Physical Exam Details Patient Name: [name], Kaius A. Date of Service: 03/14/2015 12:45 PM Medical Record Number: [id number] Patient Account Number: [id number] Date of Birth/Sex: December 31, 1970 (44 y.o. Male) Treating RN: Montey Hora Primary Care Physician: Gayland Curry Other Clinician: Referring Physician: Treating Physician/Extender: Frann Rider in Treatment: 0 Constitutional . Pulse regular. Respirations normal and unlabored. Afebrile. . Eyes Nonicteric. Reactive to light. Ears, Nose, Mouth, and Throat Lips, teeth, and gums WNL.Marland Kitchen Moist mucosa without lesions . Neck supple and nontender. No palpable supraclavicular or cervical adenopathy. Normal sized without goiter. Respiratory WNL. No retractions.. Cardiovascular Pedal Pulses WNL. No clubbing, cyanosis or edema. Lymphatic No adneopathy. No adenopathy. No adenopathy. Musculoskeletal Adexa without tenderness or enlargement.. Digits and nails w/o clubbing, cyanosis, infection, petechiae, ischemia, or inflammatory conditions.. Integumentary (Hair, Skin) No  suspicious lesions. No crepitus or fluctuance. No peri-wound warmth or erythema. No masses.Marland Kitchen Psychiatric Judgement and insight Intact.. No evidence of depression, anxiety, or agitation.. Notes he has an ulcerated area on the left lateral lower extremity which has some granulation tissue and some slough and the rest of it is clean. Surrounding has no cellulitis or discharge. Electronic Signature(s) Signed: 03/14/2015 1:47:32 PM  By: Christin Fudge MD, FACS Entered By: Christin Fudge on 03/14/2015 13:47:31 [name] ([id number]) -------------------------------------------------------------------------------- Physician Orders Details Patient Name: [name], Lenward A. Date of Service: 03/14/2015 12:45 PM Medical Record Number: [id number] Patient Account Number: [id number] Date of Birth/Sex: 1970-10-29 (44 y.o. Male) Treating RN: Montey Hora Primary Care Physician: Gayland Curry Other Clinician: Referring Physician: Treating Physician/Extender: Frann Rider in Treatment: 0 Verbal / Phone Orders: Yes Clinician: Montey Hora Read Back and Verified: Yes Diagnosis Coding Wound Cleansing Wound #1 Left,Lateral Lower Leg o Clean wound with Normal Saline. Anesthetic Wound #1 Left,Lateral Lower Leg o Topical Lidocaine 4% cream applied to wound bed prior to debridement Skin Barriers/Peri-Wound Care Wound #1 Left,Lateral Lower Leg o Skin Prep Primary Wound Dressing Wound #1 Left,Lateral Lower Leg o Prisma Ag Secondary Dressing Wound #1 Left,Lateral Lower Leg o Boardered Foam Dressing Dressing Change Frequency Wound #1 Left,Lateral Lower Leg o Change dressing every other day. Follow-up Appointments Wound #1 Left,Lateral Lower Leg o Return Appointment in 1 week. Additional Orders / Instructions Wound #1 Left,Lateral Lower Leg o Other: - quit tobacco products Electronic Signature(s) ROMELLO, SPIELBERGER ([id number]) Signed: 03/14/2015 4:35:06 PM By:  Christin Fudge MD, FACS Signed: 03/14/2015 5:09:58 PM By: Montey Hora Entered By: Montey Hora on 03/14/2015 13:37:44 Bumgarner, Divit Loni Muse ([id number]) -------------------------------------------------------------------------------- Problem List Details Patient Name: [name], Kavonte A. Date of Service: 03/14/2015 12:45 PM Medical Record Number: [id number] Patient Account Number: [id number] Date of Birth/Sex: 21-Aug-1970 (44 y.o. 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 Date of Service: 03/14/2015 12:45 PM Medical Record Number: [id number] Patient Account Number: [id number] Date of Birth/Sex: 07/09/1970 (44 y.o. Male) Treating RN: Montey Hora Primary Care Physician: Gayland Curry Other Clinician: Referring Physician: Treating Physician/Extender: Frann Rider in Treatment: 0 Subjective Chief Complaint Information  obtained from Patient Patient presents to the wound care center for a consult due non healing wound of the left lower extremity which he has had for about a month and a half. History of Present Illness (HPI) The following HPI elements were documented for the patient's wound: Location: left lower extremity laterally Quality: Patient reports experiencing a dull pain to affected area(s). Severity: Patient states wound (s) are getting better. Duration: Patient has had the wound for < 6 weeks prior to presenting for treatment Timing: Pain in wound is Intermittent (comes and goes Context: The wound occurred when the patient had a shave biopsy of a lesion on that leg which was benign Modifying Factors: Other treatment(s) tried include:was put on Keflex and Bactrim by his PCP Associated Signs and Symptoms: Patient reports presence of swelling Pleasant 44 year old gentleman who had a shave biopsy done from his left lower extremity on October 3 and the wound has not healed since then. This was a  benign disease and he was concerned about the length of time it has taken to heal. He has past medical history of diabetes mellitus, gout, hypertension, sleep apnea and is status post tonsillectomy and adenoidectomy, cholecystectomy and uvulopalatopharyngoplasty. he was recently seen by his PCP Dr. Jodi Mourning who had already given him Keflex for a week and then put him on Bactrim for 10 days. the patient was a smoker before and quit in 1995 but currently chews tobacco on a daily basis. Wound History Patient presents with 1 open wound that has been present for approximately 6 weeks. Patient has been treating wound in the following manner: neosporin, bandaid. Laboratory tests have not been performed in the last month. Patient reportedly has not tested positive for an antibiotic resistant organism. Patient reportedly has not tested positive for osteomyelitis. Patient reportedly has not had testing performed  to evaluate circulation in the legs. Patient History Information obtained from Patient. Allergies hydrocortisone Laski, Ruger A. ([id number]) Family History Cancer - Father, No family history of Diabetes, Heart Disease, Hereditary Spherocytosis, Hypertension, Kidney Disease, Lung Disease, Seizures, Stroke, Thyroid Problems, Tuberculosis. Social History Former smoker - quit 10 years ago, Marital Status - Married, Alcohol Use - Never, Drug Use - No History, Caffeine Use - Never. Medical History Respiratory Patient has history of Sleep Apnea - OSA, CPAP Cardiovascular Patient has history of Hypertension Endocrine Patient has history of Type II Diabetes Musculoskeletal Patient has history of Gout Oncologic Denies history of Received Chemotherapy, Received Radiation Patient is treated with Oral Agents. Blood sugar is tested. Review of Systems (ROS) Constitutional Symptoms (General Health) The patient has no complaints or symptoms. Eyes The patient has no complaints or symptoms. Ear/Nose/Mouth/Throat The patient has no complaints or symptoms. Hematologic/Lymphatic The patient has no complaints or symptoms. Respiratory The patient has no complaints or symptoms. Gastrointestinal The patient has no complaints or symptoms. Endocrine The patient has no complaints or symptoms. Genitourinary The patient has no complaints or symptoms. Immunological The patient has no complaints or symptoms. Integumentary (Skin) The patient has no complaints or symptoms. Musculoskeletal The patient has no complaints or symptoms. Neurologic The patient has no complaints or symptoms. Oncologic Telford, Haziel AMarland Kitchen ([id number]) The patient has no complaints or symptoms. Psychiatric The patient has no complaints or symptoms. Medications fluocinonide topical unspecified topical metformin ER 500 mg tablet,extended release 24hr oral 1 1 tablet extended release 24hr oral lisinopril 5 mg tablet  oral 1 1 tablet oral allopurinol 100 mg tablet oral 1 1 tablet oral fluoxetine 20 mg tablet oral 1 1 tablet oral Objective Constitutional Pulse regular. Respirations normal and unlabored. Afebrile. Vitals Time Taken: 12:59 PM, Height: 72 in, Source: Stated, Weight: 360 lbs, Source: Measured, BMI: 48.8, Temperature: 98.2 F, Pulse: 71 bpm, Respiratory Rate: 18 breaths/min, Blood Pressure: 134/70 mmHg, Capillary Blood Glucose: 115 mg/dl. Eyes Nonicteric. Reactive to light. Ears, Nose, Mouth, and Throat Lips, teeth, and gums WNL.Marland Kitchen Moist mucosa without lesions . Neck supple and nontender. No palpable supraclavicular or cervical adenopathy. Normal sized without goiter. Respiratory WNL. No retractions.. Cardiovascular Pedal Pulses WNL. No clubbing, cyanosis or edema. Lymphatic No adneopathy. No adenopathy. No adenopathy. Musculoskeletal Adexa without tenderness or enlargement.. Digits and nails w/o clubbing, cyanosis, infection, petechiae, ischemia, or inflammatory conditions.[name], Jaxton Loni Muse ([id number]) Psychiatric Judgement and insight Intact.. No evidence of depression, anxiety, or agitation.. General Notes: he has an ulcerated area on the left lateral lower extremity which has some granulation tissue and some slough and the rest of it is clean. Surrounding  has no cellulitis or discharge. Integumentary (Hair, Skin) No suspicious lesions. No crepitus or fluctuance. No peri-wound warmth or erythema. No masses.. Wound #1 status is Open. Original cause of wound was Surgical Injury. The wound is located on the Left,Lateral Lower Leg. The wound measures 1.8cm length x 2cm width x 0.1cm depth; 2.827cm^2 area and 0.283cm^3 volume. The wound is limited to skin breakdown. There is no tunneling or undermining noted. There is a small amount of serous drainage noted. The wound margin is flat and intact. There is large (67-100%) red granulation within the wound bed. There is a small (1-33%)  amount of necrotic tissue within the wound bed including Adherent Slough. The periwound skin appearance exhibited: Erythema. The periwound skin appearance did not exhibit: Callus, Crepitus, Excoriation, Fluctuance, Friable, Induration, Localized Edema, Rash, Scarring, Dry/Scaly, Maceration, Moist, Atrophie Blanche, Cyanosis, Ecchymosis, Hemosiderin Staining, Mottled, Pallor, Rubor. The surrounding wound skin color is noted with erythema which is circumferential. The periwound has tenderness on palpation. Assessment Active Problems ICD-10 E11.622 - Type 2 diabetes mellitus with other skin ulcer T81.31XA - Disruption of external operation (surgical) wound, not elsewhere classified, initial encounter L97.222 - Non-pressure chronic ulcer of left calf with fat layer exposed F17.228 - Nicotine dependence, chewing tobacco, with other nicotine-induced disorders E66.01 - Morbid (severe) obesity due to excess calories This pleasant 44 year old gentleman who is a diabetic has had an nonhealing wound on the left lower extremity from a surgical scar due to a shave biopsy for a skin lesion which was benign. I have recommended Prisma Ag and a bordered foam dressing to be applied on alternate days. I also discussed his nicotine addiction with chewing tobacco and have discussed the risks benefits and methods to give up chewing. He fully understands the treatment plan and says he will be agreeable. He will come back to see me in a week's time. Eastman, Aws AMarland Kitchen ([id number]) Plan Wound Cleansing: Wound #1 Left,Lateral Lower Leg: Clean wound with Normal Saline. Anesthetic: Wound #1 Left,Lateral Lower Leg: Topical Lidocaine 4% cream applied to wound bed prior to debridement Skin Barriers/Peri-Wound Care: Wound #1 Left,Lateral Lower Leg: Skin Prep Primary Wound Dressing: Wound #1 Left,Lateral Lower Leg: Prisma Ag Secondary Dressing: Wound #1 Left,Lateral Lower Leg: Boardered Foam Dressing Dressing  Change Frequency: Wound #1 Left,Lateral Lower Leg: Change dressing every other day. Follow-up Appointments: Wound #1 Left,Lateral Lower Leg: Return Appointment in 1 week. Additional Orders / Instructions: Wound #1 Left,Lateral Lower Leg: Other: - quit tobacco products This pleasant 44 year old gentleman who is a diabetic has had an nonhealing wound on the left lower extremity from a surgical scar due to a shave biopsy for a skin lesion which was benign. I have recommended Prisma Ag and a bordered foam dressing to be applied on alternate days. I also discussed his nicotine addiction with chewing tobacco and have discussed the risks benefits and methods to give up chewing. He fully understands the treatment plan and says he will be agreeable. He will come back to see me in a week's time. Electronic Signature(s) Signed: 03/14/2015 1:51:07 PM By: Christin Fudge MD, FACS Previous Signature: 03/14/2015 1:49:12 PM Version By: Christin Fudge MD, FACS Entered By: Christin Fudge on 03/14/2015 13:51:07 [name] ([id number]) [name], Natthew Loni Muse ([id number]) -------------------------------------------------------------------------------- ROS/PFSH Details Patient Name: [name], Jorma A. Date of Service: 03/14/2015 12:45 PM Medical Record Number: [id number] Patient Account Number: [id number] Date of Birth/Sex: 1971-03-23 (44 y.o. Male) Treating RN: Montey Hora Primary Care Physician: Gayland Curry Other Clinician: Referring Physician: Treating Physician/Extender:  Franny Selvage Weeks in Treatment: 0 Information Obtained From Patient Wound History Do you currently have one or more open woundso Yes How many open wounds do you currently haveo 1 Approximately how long have you had your woundso 6 weeks How have you been treating your wound(s) until nowo neosporin, bandaid Has your wound(s) ever healed and then re-openedo No Have you had any lab work done in the past montho No Have  you tested positive for an antibiotic resistant organism (MRSA, VRE)o No Have you tested positive for osteomyelitis (bone infection)o No Have you had any tests for circulation on your legso No Constitutional Symptoms (General Health) Complaints and Symptoms: No Complaints or Symptoms Eyes Complaints and Symptoms: No Complaints or Symptoms Ear/Nose/Mouth/Throat Complaints and Symptoms: No Complaints or Symptoms Hematologic/Lymphatic Complaints and Symptoms: No Complaints or Symptoms Respiratory Complaints and Symptoms: No Complaints or Symptoms Medical History: Positive for: Sleep Apnea - OSA, CPAP Cardiovascular Jolley, Elmore AMarland Kitchen ([id number]) Medical History: Positive for: Hypertension Gastrointestinal Complaints and Symptoms: No Complaints or Symptoms Endocrine Complaints and Symptoms: No Complaints or Symptoms Medical History: Positive for: Type II Diabetes Time with diabetes: 4 months ago Treated with: Oral agents Blood sugar tested every day: Yes Tested : QD Genitourinary Complaints and Symptoms: No Complaints or Symptoms Immunological Complaints and Symptoms: No Complaints or Symptoms Integumentary (Skin) Complaints and Symptoms: No Complaints or Symptoms Musculoskeletal Complaints and Symptoms: No Complaints or Symptoms Medical History: Positive for: Gout Neurologic Complaints and Symptoms: No Complaints or Symptoms Oncologic Complaints and Symptoms: No Complaints or Symptoms Hathaway, Rowe A. ([id number]) Medical History: Negative for: Received Chemotherapy; Received Radiation Psychiatric Complaints and Symptoms: No Complaints or Symptoms Immunizations Immunization Notes: up to date Family and Social History Cancer: Yes - Father; Diabetes: No; Heart Disease: No; Hereditary Spherocytosis: No; Hypertension: No; Kidney Disease: No; Lung Disease: No; Seizures: No; Stroke: No; Thyroid Problems: No; Tuberculosis: No; Former smoker - quit 10 years  ago; Marital Status - Married; Alcohol Use: Never; Drug Use: No History; Caffeine Use: Never; Financial Concerns: No; Food, Clothing or Shelter Needs: No; Support System Lacking: No; Transportation Concerns: No; Advanced Directives: No; Patient does not want information on Advanced Directives Physician Affirmation I have reviewed and agree with the above information. Electronic Signature(s) Signed: 03/14/2015 1:27:15 PM By: Christin Fudge MD, FACS Signed: 03/14/2015 5:09:58 PM By: Montey Hora Previous Signature: 03/14/2015 1:26:51 PM Version By: Christin Fudge MD, FACS Entered By: Christin Fudge on 03/14/2015 13:27:15 [name] ([id number]) -------------------------------------------------------------------------------- SuperBill Details Patient Name: [name], Delio A. Date of Service: 03/14/2015 Medical Record Number: [id number] Patient Account Number: [id number] Date of Birth/Sex: [birth year] (44 y.o. Male) Treating RN: Montey Hora Primary Care Physician: Gayland Curry Other Clinician: Referring Physician: Treating Physician/Extender: Frann Rider in Treatment: 0 Diagnosis Coding ICD-10 Codes Code Description E11.622 Type 2 diabetes mellitus with other skin ulcer Disruption of external operation (surgical) wound, not elsewhere classified, initial T81.31XA encounter F17.228 Nicotine dependence, chewing tobacco, with other nicotine-induced disorders E66.01 Morbid (severe) obesity due to excess calories Facility Procedures CPT4: Description Modifier Quantity Code [id number] - WOUND CARE VISIT-LEV 4 EST PT 1 CPT4: [id number]-SMOKING CESSATION 3-10MINS 1 ICD-10 Description Diagnosis E11.622 Type 2 diabetes mellitus with other skin ulcer F17.228 Nicotine dependence, chewing tobacco, with other nicotine-induced disorders Physician Procedures CPT4: Description Modifier Quantity Code [id number] - WC PHYS LEVEL 4 - NEW PT 1 ICD-10 Description Diagnosis  E11.622 Type 2 diabetes mellitus with other skin ulcer T81.31XA Disruption of external operation (surgical) wound, not elsewhere classified,  initial encounter F17.228 Nicotine dependence, chewing tobacco, with other nicotine-induced disorders E66.01 Morbid (severe) obesity due to excess calories CPT4: 99406 99406- SMOKING CESSATION 3-10 MINS 1 Description BONIFACE, ROTUNNO ([id number]) Electronic Signature(s) Signed: 03/14/2015 1:50:15 PM By: Christin Fudge MD, FACS Entered By: Christin Fudge on 03/14/2015 13:50:15

## 2015-03-15 NOTE — Progress Notes (Signed)
Jacob [name] ([id number]) Visit Report for 03/14/2015 Allergy List Details Patient Name: Jacob [name], [name] Date of Service: 03/14/2015 12:45 PM Medical Record Number: [id number] Patient Account Number: [id number] Date of Birth/Sex: May 13, 1970 (44 y.o. Male) Treating RN: Montey Hora Primary Care Physician: Gayland Curry Other Clinician: Referring Physician: Treating Physician/Extender: Frann Rider in Treatment: 0 Allergies Active Allergies hydrocortisone Allergy Notes Electronic Signature(s) Signed: 03/14/2015 5:09:58 PM By: Montey Hora Entered By: Montey Hora on 03/14/2015 13:03:07 [name] ([id number]) -------------------------------------------------------------------------------- Arrival Information Details Patient Name: Jacob [name], [name] Date of Service: 03/14/2015 12:45 PM Medical Record Number: [id number] Patient Account Number: [id number] Date of Birth/Sex: 07-07-1970 (44 y.o. Male) Treating RN: Montey Hora Primary Care Physician: Gayland Curry Other Clinician: Referring Physician: Treating Physician/Extender: Frann Rider in Treatment: 0 Visit Information Patient Arrived: Ambulatory Arrival Time: 12:55 Accompanied By: self Transfer Assistance: None Patient Identification Verified: Yes Secondary Verification Process Yes Completed: Patient Has Alerts: Yes Patient Alerts: DMII Electronic Signature(s) Signed: 03/14/2015 5:09:58 PM By: Montey Hora Entered By: Montey Hora on 03/14/2015 12:59:22 [name] ([id number]) -------------------------------------------------------------------------------- Clinic Level of Care Assessment Details Patient Name: Jacob [name], [name] Date of Service: 03/14/2015 12:45 PM Medical Record Number: [id number] Patient Account Number: [id number] Date of Birth/Sex: 08-02-1970 (44 y.o. Male) Treating RN: Montey Hora Primary Care Physician: Gayland Curry Other  Clinician: Referring Physician: Treating Physician/Extender: Frann Rider in Treatment: 0 Clinic Level of Care Assessment Items TOOL 2 Quantity Score []  - Use when only an EandM is performed on the INITIAL visit 0 ASSESSMENTS - Nursing Assessment / Reassessment X - General Physical Exam (combine w/ comprehensive assessment (listed just 1 20 below) when performed on new pt. evals) X - Comprehensive Assessment (HX, ROS, Risk Assessments, Wounds Hx, etc.) 1 25 ASSESSMENTS - Wound and Skin Assessment / Reassessment X - Simple Wound Assessment / Reassessment - one wound 1 5 []  - Complex Wound Assessment / Reassessment - multiple wounds 0 []  - Dermatologic / Skin Assessment (not related to wound area) 0 ASSESSMENTS - Ostomy and/or Continence Assessment and Care []  - Incontinence Assessment and Management 0 []  - Ostomy Care Assessment and Management (repouching, etc.) 0 PROCESS - Coordination of Care X - Simple Patient / Family Education for ongoing care 1 15 []  - Complex (extensive) Patient / Family Education for ongoing care 0 X - Staff obtains Consents, Records, Test Results / Process Orders 1 10 []  - Staff telephones HHA, Nursing Homes / Clarify orders / etc 0 []  - Routine Transfer to another Facility (non-emergent condition) 0 []  - Routine Hospital Admission (non-emergent condition) 0 X - New Admissions / Biomedical engineer / Ordering NPWT, Apligraf, etc. 1 15 []  - Emergency Hospital Admission (emergent condition) 0 X - Simple Discharge Coordination 1 10 Schneller, [name] ([id number]) []  - Complex (extensive) Discharge Coordination 0 PROCESS - Special Needs []  - Pediatric / Minor Patient Management 0 []  - Isolation Patient Management 0 []  - Hearing / Language / Visual special needs 0 []  - Assessment of Community assistance (transportation, D/C planning, etc.) 0 []  - Additional assistance / Altered mentation 0 []  - Support Surface(s) Assessment (bed, cushion, seat,  etc.) 0 INTERVENTIONS - Wound Cleansing / Measurement X - Wound Imaging (photographs - any number of wounds) 1 5 []  - Wound Tracing (instead of photographs) 0 X - Simple Wound Measurement - one wound 1 5 []  - Complex Wound Measurement - multiple wounds 0 X - Simple Wound Cleansing - one wound 1 5 []  - Complex  Wound Cleansing - multiple wounds 0 INTERVENTIONS - Wound Dressings X - Small Wound Dressing one or multiple wounds 1 10 []  - Medium Wound Dressing one or multiple wounds 0 []  - Large Wound Dressing one or multiple wounds 0 []  - Application of Medications - injection 0 INTERVENTIONS - Miscellaneous []  - External ear exam 0 []  - Specimen Collection (cultures, biopsies, blood, body fluids, etc.) 0 []  - Specimen(s) / Culture(s) sent or taken to Lab for analysis 0 []  - Patient Transfer (multiple staff / Harrel Lemon Lift / Similar devices) 0 []  - Simple Staple / Suture removal (25 or less) 0 []  - Complex Staple / Suture removal (26 or more) 0 [place], [name] ([id number]) []  - Hypo / Hyperglycemic Management (close monitor of Blood Glucose) 0 X - Ankle / Brachial Index (ABI) - do not check if billed separately 1 15 Has the patient been seen at the hospital within the last three years: Yes Total Score: 140 Level Of Care: New/Established - Level 4 Electronic Signature(s) Signed: 03/14/2015 5:09:58 PM By: Montey Hora Entered By: Montey Hora on 03/14/2015 13:36:32 [name] ([id number]) -------------------------------------------------------------------------------- Encounter Discharge Information Details Patient Name: Jacob [name], [name] Date of Service: 03/14/2015 12:45 PM Medical Record Number: [id number] Patient Account Number: [id number] Date of Birth/Sex: 12-13-1970 (44 y.o. Male) Treating RN: Montey Hora Primary Care Physician: Gayland Curry Other Clinician: Referring Physician: Treating Physician/Extender: Frann Rider in Treatment: 0 Encounter  Discharge Information Items Discharge Pain Level: 0 Discharge Condition: Stable Ambulatory Status: Ambulatory Discharge Destination: Home Transportation: Private Auto Accompanied By: self Schedule Follow-up Appointment: Yes Medication Reconciliation completed and provided to Patient/Care No [name] Barnier: Provided on Clinical Summary of Care: 03/14/2015 Form Type Recipient Paper Patient [facility] Electronic Signature(s) Signed: 03/14/2015 1:47:36 PM By: Ruthine Dose Entered By: Ruthine Dose on 03/14/2015 13:47:36 Hine, Kelan Loni Muse ([id number]) -------------------------------------------------------------------------------- Lower Extremity Assessment Details Patient Name: Jacob [name], [name] Date of Service: 03/14/2015 12:45 PM Medical Record Number: [id number] Patient Account Number: [id number] Date of Birth/Sex: 1971/02/11 (44 y.o. Male) Treating RN: Montey Hora Primary Care Physician: Gayland Curry Other Clinician: Referring Physician: Treating Physician/Extender: Frann Rider in Treatment: 0 Edema Assessment Assessed: [Left: No] [Right: No] Edema: [Left: N] [Right: o] Calf Left: Right: Point of Measurement: 37 cm From Medial Instep 49.5 cm cm Ankle Left: Right: Point of Measurement: 12 cm From Medial Instep 29 cm cm Vascular Assessment Pulses: Posterior Tibial Palpable: [Left:Yes] Dorsalis Pedis Palpable: [Left:Yes] Extremity colors, hair growth, and conditions: Extremity Color: [Left:Normal] Hair Growth on Extremity: [Left:Yes] Temperature of Extremity: [Left:Warm] Capillary Refill: [Left:< 3 seconds] Blood Pressure: Brachial: [Left:130] Dorsalis Pedis: 150 [Left:Dorsalis Pedis:] Ankle: Posterior Tibial: 162 [Left:Posterior Tibial: 1.25] Toe Nail Assessment Left: Right: Thick: Yes Discolored: Yes Deformed: No Improper Length and Hygiene: No Electronic Signature(s) NORMAL, GRESS ([id number]) Signed: 03/14/2015 5:09:58 PM By: Montey Hora Entered By: Montey Hora on 03/14/2015 13:22:50 Jacob [name], Demaryius Loni Muse ([id number]) -------------------------------------------------------------------------------- Multi Wound Chart Details Patient Name: Jacob [name], [name] Date of Service: 03/14/2015 12:45 PM Medical Record Number: [id number] Patient Account Number: [id number] Date of Birth/Sex: 29-Sep-1970 (44 y.o. Male) Treating RN: Montey Hora Primary Care Physician: Gayland Curry Other Clinician: Referring Physician: Treating Physician/Extender: Frann Rider in Treatment: 0 Vital Signs Height(in): 72 Capillary Blood 115 Glucose(mg/dl): Weight(lbs): 360 Pulse(bpm): 71 Body Mass Index(BMI): 49 Blood Pressure Temperature(F): 98.2 134/70 (mmHg): Respiratory Rate 18 (breaths/min): Photos: [1:No Photos] [N/A:N/A] Wound Location: [1:Left Lower Leg - Lateral] [N/A:N/A] Wounding Event: [1:Surgical Injury] [N/A:N/A] Primary Etiology: [1:Open Surgical Wound] [N/A:N/A] Comorbid  History: [1:Sleep Apnea, Hypertension, Type II Diabetes, Gout] [N/A:N/A] Date Acquired: [1:01/30/2015] [N/A:N/A] Weeks of Treatment: [1:0] [N/A:N/A] Wound Status: [1:Open] [N/A:N/A] Measurements L x W x D 1.8x2x0.1 [N/A:N/A] (cm) Area (cm) : [1:2.827] [N/A:N/A] Volume (cm) : [1:0.283] [N/A:N/A] Classification: [1:Full Thickness Without Exposed Support Structures] [N/A:N/A] HBO Classification: [1:Grade 1] [N/A:N/A] Exudate Amount: [1:Small] [N/A:N/A] Exudate Type: [1:Serous] [N/A:N/A] Exudate Color: [1:amber] [N/A:N/A] Wound Margin: [1:Flat and Intact] [N/A:N/A] Granulation Amount: [1:Large (67-100%)] [N/A:N/A] Granulation Quality: [1:Red] [N/A:N/A] Necrotic Amount: [1:Small (1-33%)] [N/A:N/A] Exposed Structures: [1:Fascia: No Fat: No Tendon: No Muscle: No Joint: No] [N/A:N/A] Bone: No Limited to Skin Breakdown Epithelialization: None N/A N/A Periwound Skin Texture: Edema: No N/A N/A Excoriation: No Induration: No Callus:  No Crepitus: No Fluctuance: No Friable: No Rash: No Scarring: No Periwound Skin Maceration: No N/A N/A Moisture: Moist: No Dry/Scaly: No Periwound Skin Color: Erythema: Yes N/A N/A Atrophie Blanche: No Cyanosis: No Ecchymosis: No Hemosiderin Staining: No Mottled: No Pallor: No Rubor: No Erythema Location: Circumferential N/A N/A Tenderness on Yes N/A N/A Palpation: Wound Preparation: Ulcer Cleansing: N/A N/A Rinsed/Irrigated with Saline Topical Anesthetic Applied: Other: lidocaine 4% Treatment Notes Electronic Signature(s) Signed: 03/14/2015 5:09:58 PM By: Montey Hora Entered By: Montey Hora on 03/14/2015 13:30:15 [name] ([id number]) -------------------------------------------------------------------------------- [place] Details Patient Name: Jacob [name], [name] Date of Service: 03/14/2015 12:45 PM Medical Record Number: [id number] Patient Account Number: [id number] Date of Birth/Sex: 24-Sep-1970 (44 y.o. Male) Treating RN: Montey Hora Primary Care Physician: Gayland Curry Other Clinician: Referring Physician: Treating Physician/Extender: Frann Rider in Treatment: 0 Active Inactive Orientation to the Wound Care Program Nursing Diagnoses: Knowledge deficit related to the wound healing center program Goals: Patient/caregiver will verbalize understanding of the [place] Program Date Initiated: 03/14/2015 Goal Status: Active Interventions: Provide education on orientation to the wound center Notes: Wound/Skin Impairment Nursing Diagnoses: Impaired tissue integrity Goals: Patient/caregiver will verbalize understanding of skin care regimen Date Initiated: 03/14/2015 Goal Status: Active Ulcer/skin breakdown will have a volume reduction of 30% by week 4 Date Initiated: 03/14/2015 Goal Status: Active Ulcer/skin breakdown will have a volume reduction of 50% by week 8 Date Initiated:  03/14/2015 Goal Status: Active Ulcer/skin breakdown will have a volume reduction of 80% by week 12 Date Initiated: 03/14/2015 Goal Status: Active Ulcer/skin breakdown will heal within 14 weeks Date Initiated: 03/14/2015 Goal Status: Active LAMART, ANNIS ([id number]) Interventions: Assess patient/caregiver ability to perform ulcer/skin care regimen upon admission and as needed Assess ulceration(s) every visit Notes: Electronic Signature(s) Signed: 03/14/2015 5:09:58 PM By: Montey Hora Entered By: Montey Hora on 03/14/2015 13:29:59 [name] ([id number]) -------------------------------------------------------------------------------- Patient/Caregiver Education Details Patient Name: Jacob [name], [name] Date of Service: 03/14/2015 12:45 PM Medical Record Number: [id number] Patient Account Number: [id number] Date of Birth/Gender: Apr 29, 1971 (44 y.o. Male) Treating RN: Montey Hora Primary Care Physician: Gayland Curry Other Clinician: Referring Physician: Treating Physician/Extender: Frann Rider in Treatment: 0 Education Assessment Education Provided To: Patient Education Topics Provided Wound/Skin Impairment: Handouts: Other: wound care as ordered Methods: Demonstration, Explain/Verbal Responses: State content correctly Electronic Signature(s) Signed: 03/14/2015 5:09:58 PM By: Montey Hora Entered By: Montey Hora on 03/14/2015 13:47:31 Barner, Khrystian Loni Muse ([id number]) -------------------------------------------------------------------------------- Wound Assessment Details Patient Name: Jacob [name], [name] Date of Service: 03/14/2015 12:45 PM Medical Record Number: [id number] Patient Account Number: [id number] Date of Birth/Sex: Sep 24, 1970 (44 y.o. Male) Treating RN: Montey Hora Primary Care Physician: Gayland Curry Other Clinician: Referring Physician: Treating Physician/Extender: Frann Rider in Treatment: 0 Wound  Status Wound Number: 1 Primary Open  Surgical Wound Etiology: Wound Location: Left Lower Leg - Lateral Wound Status: Open Wounding Event: Surgical Injury Comorbid Sleep Apnea, Hypertension, Type II Date Acquired: 01/30/2015 History: Diabetes, Gout Weeks Of Treatment: 0 Clustered Wound: No Photos Photo Uploaded By: Montey Hora on 03/14/2015 15:10:42 Wound Measurements Length: (cm) 1.8 Width: (cm) 2 Depth: (cm) 0.1 Area: (cm) 2.827 Volume: (cm) 0.283 % Reduction in Area: % Reduction in Volume: Epithelialization: None Tunneling: No Undermining: No Wound Description Full Thickness Without Exposed Classification: Support Structures Diabetic Severity Grade 1 (Wagner): Wound Margin: Flat and Intact Exudate Amount: Small Exudate Type: Serous Exudate Color: amber Wound Bed Granulation Amount: Large (67-100%) Exposed Structure Granulation Quality: Red Fascia Exposed: No Reap, Vu A. ([id number]) Necrotic Amount: Small (1-33%) Fat Layer Exposed: No Necrotic Quality: Adherent Slough Tendon Exposed: No Muscle Exposed: No Joint Exposed: No Bone Exposed: No Limited to Skin Breakdown Periwound Skin Texture Texture Color No Abnormalities Noted: No No Abnormalities Noted: No Callus: No Atrophie Blanche: No Crepitus: No Cyanosis: No Excoriation: No Ecchymosis: No Fluctuance: No Erythema: Yes Friable: No Erythema Location: Circumferential Induration: No Hemosiderin Staining: No Localized Edema: No Mottled: No Rash: No Pallor: No Scarring: No Rubor: No Moisture Temperature / Pain No Abnormalities Noted: No Tenderness on Palpation: Yes Dry / Scaly: No Maceration: No Moist: No Wound Preparation Ulcer Cleansing: Rinsed/Irrigated with Saline Topical Anesthetic Applied: Other: lidocaine 4%, Treatment Notes Wound #1 (Left, Lateral Lower Leg) 1. Cleansed with: Clean wound with Normal Saline 2. Anesthetic Topical Lidocaine 4% cream to wound bed prior  to debridement 3. Peri-wound Care: Skin Prep 4. Dressing Applied: Prisma Ag 5. Secondary Dressing Applied Bordered Foam Dressing Electronic Signature(s) Signed: 03/14/2015 5:09:58 PM By: Montey Hora Entered By: Montey Hora on 03/14/2015 13:16:11 [name] ([id number]) Jacob [name], Winford AMarland Kitchen ([id number]) -------------------------------------------------------------------------------- Vitals Details Patient Name: Jacob [name], [name] Date of Service: 03/14/2015 12:45 PM Medical Record Number: [id number] Patient Account Number: [id number] Date of Birth/Sex: December 02, 1970 (44 y.o. Male) Treating RN: Montey Hora Primary Care Physician: Gayland Curry Other Clinician: Referring Physician: Treating Physician/Extender: Frann Rider in Treatment: 0 Vital Signs Time Taken: 12:59 Temperature (F): 98.2 Height (in): 72 Pulse (bpm): 71 Source: Stated Respiratory Rate (breaths/min): 18 Weight (lbs): 360 Blood Pressure (mmHg): 134/70 Source: Measured Capillary Blood Glucose (mg/dl): 115 Body Mass Index (BMI): 48.8 Reference Range: 80 - 120 mg / dl Electronic Signature(s) Signed: 03/14/2015 5:09:58 PM By: Montey Hora Entered By: Montey Hora on 03/14/2015 13:02:40

## 2015-03-21 ENCOUNTER — Encounter: Payer: BC Managed Care – PPO | Admitting: Surgery

## 2015-03-21 DIAGNOSIS — E11622 Type 2 diabetes mellitus with other skin ulcer: Secondary | ICD-10-CM | POA: Diagnosis not present

## 2015-03-22 ENCOUNTER — Other Ambulatory Visit: Payer: Self-pay | Admitting: Family Medicine

## 2015-03-22 DIAGNOSIS — D352 Benign neoplasm of pituitary gland: Secondary | ICD-10-CM

## 2015-03-22 NOTE — Progress Notes (Signed)
YONIC, GUYMON ([id number]) Visit Report for 03/21/2015 Chief Complaint Document Details Patient Name: [name], [name] Date of Service: 03/21/2015 10:00 AM Medical Record Number: [id number] Patient Account Number: [id number] Date of Birth/Sex: [birth year] (44 y.o. Male) Treating RN: Jacob [name] Primary Care Physician: Jacob [name] Other Clinician: Referring Physician: Gayland [name] Treating Physician/Extender: Jacob [name] in Treatment: 1 Information Obtained from: Patient Chief Complaint Patient presents to the wound care center for a consult due non healing wound of the left lower extremity which he has had for about a month and a half. Electronic Signature(s) Signed: 03/21/2015 10:58:15 AM By: Jacob Fudge MD, FACS Entered By: Jacob [name] on 03/21/2015 10:58:15 Jacob [name] ([id number]) -------------------------------------------------------------------------------- Debridement Details Patient Name: Jacob [name], [name] Date of Service: 03/21/2015 10:00 AM Medical Record Number: [id number] Patient Account Number: [id number] Date of Birth/Sex: 01-25-1971 (44 y.o. Male) Treating RN: Jacob [name] Primary Care Physician: Jacob [name] Other Clinician: Referring Physician: Gayland [name] Treating Physician/Extender: Jacob [name] in Treatment: 1 Debridement Performed for Wound #1 Left,Lateral Lower Leg Assessment: Performed By: Physician Jacob Fudge, MD Debridement: Debridement Pre-procedure Yes Verification/Time Out Taken: Start Time: 10:44 Pain Control: Lidocaine 4% Topical Solution Level: Skin/Subcutaneous Tissue Total Area Debrided (L x 1.5 (cm) x 1.3 (cm) = 1.95 (cm) W): Tissue and other Viable, Non-Viable, Fibrin/Slough, Subcutaneous material debrided: Instrument: Forceps Bleeding: Minimum Hemostasis Achieved: Pressure End Time: 10:45 Procedural Pain: 0 Post Procedural Pain: 0 Response to Treatment: Procedure  was tolerated well Post Debridement Measurements of Total Wound Length: (cm) 1.5 Width: (cm) 1.3 Depth: (cm) 0.1 Volume: (cm) 0.153 Post Procedure Diagnosis Same as Pre-procedure Electronic Signature(s) Signed: 03/21/2015 10:58:09 AM By: Jacob Fudge MD, FACS Signed: 03/21/2015 5:17:47 PM By: Jacob [name] Entered By: Jacob [name] on 03/21/2015 10:58:09 Jacob [name] ([id number]) -------------------------------------------------------------------------------- HPI Details Patient Name: Jacob [name], [name] Date of Service: 03/21/2015 10:00 AM Medical Record Number: [id number] Patient Account Number: [id number] Date of Birth/Sex: 1971-01-15 (44 y.o. Male) Treating RN: Jacob [name] Primary Care Physician: Jacob [name] Other Clinician: Referring Physician: Gayland [name] Treating Physician/Extender: Jacob [name] in Treatment: 1 History of Present Illness Location: left lower extremity laterally Quality: Patient reports experiencing a dull pain to affected area(s). Severity: Patient states wound (s) are getting better. Duration: Patient has had the wound for < 6 weeks prior to presenting for treatment Timing: Pain in wound is Intermittent (comes and goes Context: The wound occurred when the patient had a shave biopsy of a lesion on that leg which was benign Modifying Factors: Other treatment(s) tried include:was put on Keflex and Bactrim by his PCP Associated Signs and Symptoms: Patient reports presence of swelling HPI Description: Pleasant 44 year old gentleman who had a shave biopsy done from his left lower extremity on October 3 and the wound has not healed since then. This was a benign disease and he was concerned about the length of time it has taken to heal. He has past medical history of diabetes mellitus, gout, hypertension, sleep apnea and is status post tonsillectomy and adenoidectomy, cholecystectomy and uvulopalatopharyngoplasty. he was recently  seen by his PCP Dr. Jodi [name] who had already given him Keflex for a week and then put him on Bactrim for 10 days. the patient was a smoker before and quit in 1995 but currently chews tobacco on a daily basis. Electronic Signature(s) Signed: 03/21/2015 10:58:20 AM By: Jacob Fudge MD, FACS Entered By: Jacob [name] on 03/21/2015 10:58:20 Jacob [name] ([id number]) -------------------------------------------------------------------------------- Physical Exam Details Patient Name: Jacob [name], [name] Date of  Service: 03/21/2015 10:00 AM Medical Record Number: [id number] Patient Account Number: [id number] Date of Birth/Sex: 07-05-1970 (44 y.o. Male) Treating RN: Jacob [name] Primary Care Physician: Jacob [name] Other Clinician: Referring Physician: Gayland [name] Treating Physician/Extender: Jacob [name] in Treatment: 1 Constitutional . Pulse regular. Respirations normal and unlabored. Afebrile. . Eyes Nonicteric. Reactive to light. Ears, Nose, Mouth, and Throat Lips, teeth, and gums WNL.Marland Kitchen Moist mucosa without lesions . Neck supple and nontender. No palpable supraclavicular or cervical adenopathy. Normal sized without goiter. Respiratory WNL. No retractions.. Cardiovascular Pedal Pulses WNL. No clubbing, cyanosis or edema. Lymphatic No adneopathy. No adenopathy. No adenopathy. Musculoskeletal Adexa without tenderness or enlargement.. Digits and nails w/o clubbing, cyanosis, infection, petechiae, ischemia, or inflammatory conditions.. Integumentary (Hair, Skin) No suspicious lesions. No crepitus or fluctuance. No peri-wound warmth or erythema. No masses.Marland Kitchen Psychiatric Judgement and insight Intact.. No evidence of depression, anxiety, or agitation.. Notes there is still some slough and debris which were sharply removed with a toothed forcep. Electronic Signature(s) Signed: 03/21/2015 10:58:44 AM By: Jacob Fudge MD, FACS Entered By: Jacob [name] on  03/21/2015 10:58:44 Jacob [name] ([id number]) -------------------------------------------------------------------------------- Physician Orders Details Patient Name: Jacob [name], [name] Date of Service: 03/21/2015 10:00 AM Medical Record Number: [id number] Patient Account Number: [id number] Date of Birth/Sex: 1970/08/20 (44 y.o. Male) Treating RN: Jacob [name] Primary Care Physician: Jacob [name] Other Clinician: Referring Physician: Gayland [name] Treating Physician/Extender: Jacob [name] in Treatment: 1 Verbal / Phone Orders: Yes Clinician: Montey [name] Read Back and Verified: Yes Diagnosis Coding Wound Cleansing Wound #1 Left,Lateral Lower Leg o Clean wound with Normal Saline. Anesthetic Wound #1 Left,Lateral Lower Leg o Topical Lidocaine 4% cream applied to wound bed prior to debridement Skin Barriers/Peri-Wound Care Wound #1 Left,Lateral Lower Leg o Skin Prep Primary Wound Dressing Wound #1 Left,Lateral Lower Leg o Prisma Ag Secondary Dressing Wound #1 Left,Lateral Lower Leg o Boardered Foam Dressing Dressing Change Frequency Wound #1 Left,Lateral Lower Leg o Change dressing every other day. Follow-up Appointments Wound #1 Left,Lateral Lower Leg o Return Appointment in 1 week. Additional Orders / Instructions Wound #1 Left,Lateral Lower Leg o Other: - quit tobacco products Electronic Signature(s) ELZO, KNUPP ([id number]) Signed: 03/21/2015 4:47:37 PM By: Jacob Fudge MD, FACS Signed: 03/21/2015 5:17:47 PM By: Jacob [name] Entered By: Jacob [name] on 03/21/2015 10:47:48 Jacob [name] ([id number]) -------------------------------------------------------------------------------- Problem List Details Patient Name: Jacob [name], [name] Date of Service: 03/21/2015 10:00 AM Medical Record Number: [id number] Patient Account Number: [id number] Date of Birth/Sex: 1970/08/15 (44 y.o. 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 Date of Service: 03/21/2015 10:00 AM Medical Record Number: [id number] Patient Account Number: [id number] Date of Birth/Sex: 09-28-1970 (44 y.o. Male) Treating RN: Jacob [name] Primary Care Physician: Jacob [name] Other Clinician: Referring Physician: Gayland [name] Treating Physician/Extender: Jacob [name] in Treatment: 1  Subjective Chief Complaint Information obtained from Patient Patient presents to the wound care center for a consult due non healing wound of the left lower extremity which he has had for about a month and a half. History of Present Illness (HPI) The following HPI elements were documented for the patient's wound: Location: left lower extremity laterally Quality: Patient reports experiencing a dull pain to affected area(s). Severity: Patient states wound (s) are getting better. Duration: Patient has had the wound for < 6 weeks prior to  presenting for treatment Timing: Pain in wound is Intermittent (comes and goes Context: The wound occurred when the patient had a shave biopsy of a lesion on that leg which was benign Modifying Factors: Other treatment(s) tried include:was put on Keflex and Bactrim by his PCP Associated Signs and Symptoms: Patient reports presence of swelling Pleasant 44 year old gentleman who had a shave biopsy done from his left lower extremity on October 3 and the wound has not healed since then. This was a benign disease and he was concerned about the length of time it has taken to heal. He has past medical history of diabetes mellitus, gout, hypertension, sleep apnea and is status post tonsillectomy and adenoidectomy, cholecystectomy and uvulopalatopharyngoplasty. he was recently seen by his PCP Dr. Jodi [name] who had already given him Keflex for a week and then put him on Bactrim for 10 days. the patient was a smoker before and quit in 1995 but currently chews tobacco on a daily basis. Objective Constitutional Pulse regular. Respirations normal and unlabored. Afebrile. Vitals Time Taken: 10:31 AM, Height: 72 in, Weight: 360 lbs, BMI: 48.8, Temperature: 98.3 F, Pulse: 58 bpm, Respiratory Rate: 18 breaths/min, Blood Pressure: 156/70 mmHg. Zwiebel, Nuchem AMarland Kitchen ([id number]) Eyes Nonicteric. Reactive to light. Ears, Nose, Mouth, and Throat Lips, teeth, and gums WNL.Marland Kitchen Moist mucosa without lesions . Neck supple and nontender. No palpable supraclavicular or cervical adenopathy. Normal sized without goiter. Respiratory WNL. No retractions.. Cardiovascular Pedal Pulses WNL. No clubbing, cyanosis or edema. Lymphatic No adneopathy. No adenopathy. No adenopathy. Musculoskeletal Adexa without tenderness or enlargement.. Digits and nails w/o clubbing, cyanosis, infection, petechiae, ischemia, or inflammatory conditions.Marland Kitchen Psychiatric Judgement and insight Intact.. No evidence of depression, anxiety, or  agitation.. General Notes: there is still some slough and debris which were sharply removed with a toothed forcep. Integumentary (Hair, Skin) No suspicious lesions. No crepitus or fluctuance. No peri-wound warmth or erythema. No masses.. Wound #1 status is Open. Original cause of wound was Surgical Injury. The wound is located on the Left,Lateral Lower Leg. The wound measures 1.5cm length x 1.3cm width x 0.1cm depth; 1.532cm^2 area and 0.153cm^3 volume. The wound is limited to skin breakdown. There is no tunneling or undermining noted. There is a small amount of serous drainage noted. The wound margin is flat and intact. There is large (67-100%) red granulation within the wound bed. There is a small (1-33%) amount of necrotic tissue within the wound bed including Adherent Slough. The periwound skin appearance exhibited: Erythema. The periwound skin appearance did not exhibit: Callus, Crepitus, Excoriation, Fluctuance, Friable, Induration, Localized Edema, Rash, Scarring, Dry/Scaly, Maceration, Moist, Atrophie Blanche, Cyanosis, Ecchymosis, Hemosiderin Staining, Mottled, Pallor, Rubor. The surrounding wound skin color is noted with erythema which is circumferential. The periwound has tenderness on palpation. Assessment Active Problems Jacob [name], [name] ([id number]) ICD-10 E11.622 - Type 2 diabetes mellitus with other skin ulcer T81.31XA - Disruption of external operation (surgical) wound, not elsewhere classified, initial encounter L97.222 - Non-pressure chronic ulcer of left calf with  fat layer exposed F17.228 - Nicotine dependence, chewing tobacco, with other nicotine-induced disorders E66.01 - Morbid (severe) obesity due to excess calories Procedures Wound #1 Wound #1 is an Open Surgical Wound located on the Left,Lateral Lower Leg . There was a Skin/Subcutaneous Tissue Debridement [id number]) debridement with total area of 1.95 sq cm performed by Jacob Fudge, MD. with the following  instrument(s): Forceps to remove Viable and Non-Viable tissue/material including Fibrin/Slough and Subcutaneous after achieving pain control using Lidocaine 4% Topical Solution. A time out was conducted prior to the start of the procedure. A Minimum amount of bleeding was controlled with Pressure. The procedure was tolerated well with a pain level of 0 throughout and a pain level of 0 following the procedure. Post Debridement Measurements: 1.5cm length x 1.3cm width x 0.1cm depth; 0.153cm^3 volume. Post procedure Diagnosis Wound #1: Same as Pre-Procedure Plan Wound Cleansing: Wound #1 Left,Lateral Lower Leg: Clean wound with Normal Saline. Anesthetic: Wound #1 Left,Lateral Lower Leg: Topical Lidocaine 4% cream applied to wound bed prior to debridement Skin Barriers/Peri-Wound Care: Wound #1 Left,Lateral Lower Leg: Skin Prep Primary Wound Dressing: Wound #1 Left,Lateral Lower Leg: Prisma Ag Secondary Dressing: Wound #1 Left,Lateral Lower Leg: Boardered Foam Dressing Dressing Change Frequency: Wound #1 Left,Lateral Lower Leg: Change dressing every other day. Jacob [name] ([id number]) Follow-up Appointments: Wound #1 Left,Lateral Lower Leg: Return Appointment in 1 week. Additional Orders / Instructions: Wound #1 Left,Lateral Lower Leg: Other: - quit tobacco products I have recommended Prisma Ag and a bordered foam dressing to be applied on alternate days. I also discussed his nicotine addiction with chewing tobacco and have discussed the risks benefits and methods to give up chewing. He fully understands the treatment plan and says he will be agreeable. He will come back to see me in a week's time. Electronic Signature(s) Signed: 03/21/2015 10:59:21 AM By: Jacob Fudge MD, FACS Entered By: Jacob [name] on 03/21/2015 10:59:21 Jacob [name] ([id number]) -------------------------------------------------------------------------------- SuperBill Details Patient Name:  Jacob [name], [name] Date of Service: 03/21/2015 Medical Record Number: [id number] Patient Account Number: [id number] Date of Birth/Sex: 1970-09-02 (44 y.o. Male) Treating RN: Jacob [name] Primary Care Physician: Jacob [name] Other Clinician: Referring Physician: Gayland [name] Treating Physician/Extender: Jacob [name] in Treatment: 1 Diagnosis Coding ICD-10 Codes Code Description [phone] Type 2 diabetes mellitus with other skin ulcer Disruption of external operation (surgical) wound, not elsewhere classified, initial T81.31XA encounter L97.222 Non-pressure chronic ulcer of left calf with fat layer exposed F17.228 Nicotine dependence, chewing tobacco, with other nicotine-induced disorders E66.01 Morbid (severe) obesity due to excess calories Facility Procedures CPT4: Description Modifier Quantity Code [id number] - DEB SUBQ TISSUE 20 SQ CM/< 1 ICD-10 Description Diagnosis E11.622 Type 2 diabetes mellitus with other skin ulcer T81.31XA Disruption of external operation (surgical) wound, not elsewhere  classified, initial encounter L97.222 Non-pressure chronic ulcer of left calf with fat layer exposed E66.01 Morbid (severe) obesity due to excess calories Physician Procedures CPT4: Description Modifier Quantity Code [id number] - WC PHYS SUBQ TISS 20 SQ CM 1 ICD-10 Description Diagnosis E11.622 Type 2 diabetes mellitus with other skin ulcer T81.31XA Disruption of external operation (surgical) wound, not elsewhere  classified, initial encounter L97.222 Non-pressure chronic ulcer of left calf with fat layer exposed E66.01 Morbid (severe) obesity due to excess calories Electronic Signature(s) SANAT, WADLINGTON ([id number]) Signed: 03/21/2015 11:16:38 AM By: Jacob Fudge MD, FACS Entered By: Jacob [name] on 03/21/2015 11:16:38

## 2015-03-22 NOTE — Progress Notes (Signed)
TRENT, BLONSKI ([id number]) Visit Report for 03/21/2015 Arrival Information Details Patient Name: [name], [name] Date of Service: 03/21/2015 10:00 AM Medical Record Number: [id number] Patient Account Number: [id number] Date of Birth/Sex: [birth year] (44 y.o. Male) Treating RN: Montey Hora Primary Care Physician: Gayland Curry Other Clinician: Referring Physician: Gayland Curry Treating Physician/Extender: Frann Rider in Treatment: 1 Visit Information History Since Last Visit Added or deleted any medications: No Patient Arrived: Ambulatory Any new allergies or adverse reactions: No Arrival Time: 10:28 Had a fall or experienced change in No Accompanied By: self activities of daily living that may affect Transfer Assistance: None risk of falls: Patient Identification Verified: Yes Signs or symptoms of abuse/neglect since last No Secondary Verification Process Yes visito Completed: Hospitalized since last visit: No Patient Has Alerts: Yes Pain Present Now: No Patient Alerts: DMII Electronic Signature(s) Signed: 03/21/2015 5:17:47 PM By: Montey Hora Entered By: Montey Hora on 03/21/2015 10:30:48 [name] ([id number]) -------------------------------------------------------------------------------- Encounter Discharge Information Details Patient Name: Jacob [name], [name] Date of Service: 03/21/2015 10:00 AM Medical Record Number: [id number] Patient Account Number: [id number] Date of Birth/Sex: March 19, 1971 (44 y.o. Male) Treating RN: Montey Hora Primary Care Physician: Gayland Curry Other Clinician: Referring Physician: Gayland Curry Treating Physician/Extender: Frann Rider in Treatment: 1 Encounter Discharge Information Items Discharge Pain Level: 0 Discharge Condition: Stable Ambulatory Status: Ambulatory Discharge Destination: Home Transportation: Private Auto Accompanied By: self Schedule Follow-up  Appointment: Yes Medication Reconciliation completed and provided to Patient/Care No Lot [place]: Provided on Clinical Summary of Care: 03/21/2015 Form Type Recipient Paper Patient [facility] Electronic Signature(s) Signed: 03/21/2015 10:55:39 AM By: Ruthine Dose Entered By: Ruthine Dose on 03/21/2015 10:55:39 Jacob [name] ([id number]) -------------------------------------------------------------------------------- Lower Extremity Assessment Details Patient Name: Jacob [name], [name] Date of Service: 03/21/2015 10:00 AM Medical Record Number: [id number] Patient Account Number: [id number] Date of Birth/Sex: [birth year] (44 y.o. Male) Treating RN: Montey Hora Primary Care Physician: Gayland Curry Other Clinician: Referring Physician: Gayland Curry Treating Physician/Extender: Frann Rider in Treatment: 1 Edema Assessment Assessed: [Left: No] [Right: No] Edema: [Left: Ye] [Right: s] Vascular Assessment Pulses: Posterior Tibial Dorsalis Pedis Palpable: [Left:Yes] Extremity colors, hair growth, and conditions: Extremity Color: [Left:Normal] Hair Growth on Extremity: [Left:Yes] Temperature of Extremity: [Left:Warm] Capillary Refill: [Left:< 3 seconds] Electronic Signature(s) Signed: 03/21/2015 5:17:47 PM By: Montey Hora Entered By: Montey Hora on 03/21/2015 10:35:14 [name] ([id number]) -------------------------------------------------------------------------------- Multi Wound Chart Details Patient Name: Jacob [name], [name] Date of Service: 03/21/2015 10:00 AM Medical Record Number: [id number] Patient Account Number: [id number] Date of Birth/Sex: 04-Aug-1970 (44 y.o. Male) Treating RN: Montey Hora Primary Care Physician: Gayland Curry Other Clinician: Referring Physician: Gayland Curry Treating Physician/Extender: Frann Rider in Treatment: 1 Vital Signs Height(in): 72 Pulse(bpm): 58 Weight(lbs): 360 Blood  Pressure 156/70 (mmHg): Body Mass Index(BMI): 49 Temperature(F): 98.3 Respiratory Rate 18 (breaths/min): Photos: [1:No Photos] [N/A:N/A] Wound Location: [1:Left Lower Leg - Lateral] [N/A:N/A] Wounding Event: [1:Surgical Injury] [N/A:N/A] Primary Etiology: [1:Open Surgical Wound] [N/A:N/A] Comorbid History: [1:Sleep Apnea, Hypertension, Type II Diabetes, Gout] [N/A:N/A] Date Acquired: [1:01/30/2015] [N/A:N/A] Weeks of Treatment: [1:1] [N/A:N/A] Wound Status: [1:Open] [N/A:N/A] Measurements L x W x D 1.5x1.3x0.1 [N/A:N/A] (cm) Area (cm) : [1:1.532] [N/A:N/A] Volume (cm) : [1:0.153] [N/A:N/A] % Reduction in Area: [1:45.80%] [N/A:N/A] % Reduction in Volume: 45.90% [N/A:N/A] Classification: [1:Full Thickness Without Exposed Support Structures] [N/A:N/A] HBO Classification: [1:Grade 1] [N/A:N/A] Exudate Amount: [1:Small] [N/A:N/A] Exudate Type: [1:Serous] [N/A:N/A] Exudate Color: [1:amber] [N/A:N/A] Wound Margin: [1:Flat and Intact] [N/A:N/A] Granulation Amount: [1:Large (67-100%)] [N/A:N/A] Granulation Quality: [1:Red] [N/A:N/A] Necrotic  Amount: [1:Small (1-33%)] [N/A:N/A] Exposed Structures: [1:Fascia: No Fat: No Tendon: No] [N/A:N/A] Muscle: No Joint: No Bone: No Limited to Skin Breakdown Epithelialization: None N/A N/A Periwound Skin Texture: Edema: No N/A N/A Excoriation: No Induration: No Callus: No Crepitus: No Fluctuance: No Friable: No Rash: No Scarring: No Periwound Skin Maceration: No N/A N/A Moisture: Moist: No Dry/Scaly: No Periwound Skin Color: Erythema: Yes N/A N/A Atrophie Blanche: No Cyanosis: No Ecchymosis: No Hemosiderin Staining: No Mottled: No Pallor: No Rubor: No Erythema Location: Circumferential N/A N/A Tenderness on Yes N/A N/A Palpation: Wound Preparation: Ulcer Cleansing: N/A N/A Rinsed/Irrigated with Saline Topical Anesthetic Applied: Other: lidocaine 4% Treatment Notes Electronic Signature(s) Signed: 03/21/2015  5:17:47 PM By: Montey Hora Entered By: Montey Hora on 03/21/2015 10:35:44 [name] ([id number]) -------------------------------------------------------------------------------- [place] Details Patient Name: Jacob [name], [name] Date of Service: 03/21/2015 10:00 AM Medical Record Number: [id number] Patient Account Number: [id number] Date of Birth/Sex: 04-04-1971 (44 y.o. Male) Treating RN: Montey Hora Primary Care Physician: Gayland Curry Other Clinician: Referring Physician: Gayland Curry Treating Physician/Extender: Frann Rider in Treatment: 1 Active Inactive Orientation to the Wound Care Program Nursing Diagnoses: Knowledge deficit related to the wound healing center program Goals: Patient/caregiver will verbalize understanding of the [place] Program Date Initiated: 03/14/2015 Goal Status: Active Interventions: Provide education on orientation to the wound center Notes: Wound/Skin Impairment Nursing Diagnoses: Impaired tissue integrity Goals: Patient/caregiver will verbalize understanding of skin care regimen Date Initiated: 03/14/2015 Goal Status: Active Ulcer/skin breakdown will have a volume reduction of 30% by week 4 Date Initiated: 03/14/2015 Goal Status: Active Ulcer/skin breakdown will have a volume reduction of 50% by week 8 Date Initiated: 03/14/2015 Goal Status: Active Ulcer/skin breakdown will have a volume reduction of 80% by week 12 Date Initiated: 03/14/2015 Goal Status: Active Ulcer/skin breakdown will heal within 14 weeks Date Initiated: 03/14/2015 Goal Status: Active WOODFIN, MAYABB ([id number]) Interventions: Assess patient/caregiver ability to perform ulcer/skin care regimen upon admission and as needed Assess ulceration(s) every visit Notes: Electronic Signature(s) Signed: 03/21/2015 5:17:47 PM By: Montey Hora Entered By: Montey Hora on 03/21/2015 10:35:33 [name] ([id number]) -------------------------------------------------------------------------------- Patient/Caregiver Education Details Patient Name: Jacob [name], [name] Date of Service: 03/21/2015 10:00 AM Medical Record Number: [id number] Patient Account Number: [id number] Date of Birth/Gender: [birth year] (44 y.o. Male) Treating RN: Montey Hora Primary Care Physician: Gayland Curry Other Clinician: Referring Physician: Gayland Curry Treating Physician/Extender: Frann Rider in Treatment: 1 Education Assessment Education Provided To: Patient Education Topics Provided Wound/Skin Impairment: Handouts: Other: wound care as ordered Methods: Demonstration, Explain/Verbal Responses: State content correctly Electronic Signature(s) Signed: 03/21/2015 5:17:47 PM By: Montey Hora Entered By: Montey Hora on 03/21/2015 10:54:50 [name] ([id number]) -------------------------------------------------------------------------------- Wound Assessment Details Patient Name: Jacob [name], [name] Date of Service: 03/21/2015 10:00 AM Medical Record Number: [id number] Patient Account Number: [id number] Date of Birth/Sex: Apr 18, 1971 (44 y.o. Male) Treating RN: Montey Hora Primary Care Physician: Gayland Curry Other Clinician: Referring Physician: Gayland Curry Treating Physician/Extender: Frann Rider in Treatment: 1 Wound Status Wound Number: 1 Primary Open Surgical Wound Etiology: Wound Location: Left Lower Leg - Lateral Wound Status: Open Wounding Event: Surgical Injury Comorbid Sleep Apnea, Hypertension, Type II Date Acquired: 01/30/2015 History: Diabetes, Gout Weeks Of Treatment: 1 Clustered Wound: No Photos Photo Uploaded By: Montey Hora on 03/21/2015 11:52:35 Wound Measurements Length: (cm) 1.5 Width: (cm) 1.3 Depth: (cm) 0.1 Area: (cm) 1.532 Volume: (cm) 0.153 % Reduction in Area: 45.8% % Reduction in Volume:  45.9% Epithelialization: None Tunneling: No  Undermining: No Wound Description Full Thickness Without Exposed Classification: Support Structures Diabetic Severity Grade 1 (Wagner): Wound Margin: Flat and Intact Exudate Amount: Small Exudate Type: Serous Exudate Color: amber Wound Bed Granulation Amount: Large (67-100%) Exposed Structure Granulation Quality: Red Fascia Exposed: No [name], [name] ([id number]) Necrotic Amount: Small (1-33%) Fat Layer Exposed: No Necrotic Quality: Adherent Slough Tendon Exposed: No Muscle Exposed: No Joint Exposed: No Bone Exposed: No Limited to Skin Breakdown Periwound Skin Texture Texture Color No Abnormalities Noted: No No Abnormalities Noted: No Callus: No Atrophie Blanche: No Crepitus: No Cyanosis: No Excoriation: No Ecchymosis: No Fluctuance: No Erythema: Yes Friable: No Erythema Location: Circumferential Induration: No Hemosiderin Staining: No Localized Edema: No Mottled: No Rash: No Pallor: No Scarring: No Rubor: No Moisture Temperature / Pain No Abnormalities Noted: No Tenderness on Palpation: Yes Dry / Scaly: No Maceration: No Moist: No Wound Preparation Ulcer Cleansing: Rinsed/Irrigated with Saline Topical Anesthetic Applied: Other: lidocaine 4%, Treatment Notes Wound #1 (Left, Lateral Lower Leg) 1. Cleansed with: Clean wound with Normal Saline 2. Anesthetic Topical Lidocaine 4% cream to wound bed prior to debridement 3. Peri-wound Care: Skin Prep 4. Dressing Applied: Prisma Ag 5. Secondary Dressing Applied Bordered Foam Dressing Electronic Signature(s) Signed: 03/21/2015 5:17:47 PM By: Montey Hora Entered By: Montey Hora on 03/21/2015 10:34:17 [name] ([id number]) Jacob [name], Jacob [name] Kitchen ([id number]) -------------------------------------------------------------------------------- [place] Details Patient Name: Jacob [name], Jef A. Date of Service: 03/21/2015 10:00 AM Medical Record  Number: [id number] Patient Account Number: [id number] Date of Birth/Sex: July 28, 1970 (44 y.o. Male) Treating RN: Montey Hora Primary Care Physician: Gayland Curry Other Clinician: Referring Physician: Gayland Curry Treating Physician/Extender: Frann Rider in Treatment: 1 Vital Signs Time Taken: 10:31 Temperature (F): 98.3 Height (in): 72 Pulse (bpm): 58 Weight (lbs): 360 Respiratory Rate (breaths/min): 18 Body Mass Index (BMI): 48.8 Blood Pressure (mmHg): 156/70 Reference Range: 80 - 120 mg / dl Electronic Signature(s) Signed: 03/21/2015 5:17:47 PM By: Montey Hora Entered By: Montey Hora on 03/21/2015 10:31:43

## 2015-03-28 ENCOUNTER — Encounter: Payer: BC Managed Care – PPO | Admitting: Surgery

## 2015-03-28 DIAGNOSIS — E11622 Type 2 diabetes mellitus with other skin ulcer: Secondary | ICD-10-CM | POA: Diagnosis not present

## 2015-03-29 NOTE — Progress Notes (Signed)
DAVD, GLAZEBROOK ([id number]) Visit Report for 03/28/2015 Arrival Information Details Patient Name: WISHAM, Sekai A. Date of Service: 03/28/2015 2:00 PM Medical Record Number: [id number] Patient Account Number: [id number] Date of Birth/Sex: [birth year] (44 y.o. Male) Treating RN: Montey Hora Primary Care Physician: Gayland Curry Other Clinician: Referring Physician: Gayland Curry Treating Physician/Extender: Frann Rider in Treatment: 2 Visit Information History Since Last Visit Added or deleted any medications: No Patient Arrived: Ambulatory Any new allergies or adverse reactions: No Arrival Time: 13:54 Had a fall or experienced change in No Accompanied By: self activities of daily living that may affect Transfer Assistance: None risk of falls: Patient Identification Verified: Yes Signs or symptoms of abuse/neglect since last No Secondary Verification Process Yes visito Completed: Hospitalized since last visit: No Patient Has Alerts: Yes Pain Present Now: No Patient Alerts: DMII Electronic Signature(s) Signed: 03/28/2015 5:43:56 PM By: Montey Hora Entered By: Montey Hora on 03/28/2015 13:55:10 [name] ([id number]) -------------------------------------------------------------------------------- Clinic Level of Care Assessment Details Patient Name: [name], Bates A. Date of Service: 03/28/2015 2:00 PM Medical Record Number: [id number] Patient Account Number: [id number] Date of Birth/Sex: [birth year] (44 y.o. Male) Treating RN: Montey Hora Primary Care Physician: Gayland Curry Other Clinician: Referring Physician: Gayland Curry Treating Physician/Extender: Frann Rider in Treatment: 2 Clinic Level of Care Assessment Items TOOL 4 Quantity Score []  - Use when only an EandM is performed on FOLLOW-UP visit 0 ASSESSMENTS - Nursing Assessment / Reassessment X - Reassessment of Co-morbidities (includes updates in patient  status) 1 10 X - Reassessment of Adherence to Treatment Plan 1 5 ASSESSMENTS - Wound and Skin Assessment / Reassessment X - Simple Wound Assessment / Reassessment - one wound 1 5 []  - Complex Wound Assessment / Reassessment - multiple wounds 0 []  - Dermatologic / Skin Assessment (not related to wound area) 0 ASSESSMENTS - Focused Assessment []  - Circumferential Edema Measurements - multi extremities 0 []  - Nutritional Assessment / Counseling / Intervention 0 X - Lower Extremity Assessment (monofilament, tuning fork, pulses) 1 5 []  - Peripheral Arterial Disease Assessment (using hand held doppler) 0 ASSESSMENTS - Ostomy and/or Continence Assessment and Care []  - Incontinence Assessment and Management 0 []  - Ostomy Care Assessment and Management (repouching, etc.) 0 PROCESS - Coordination of Care X - Simple Patient / Family Education for ongoing care 1 15 []  - Complex (extensive) Patient / Family Education for ongoing care 0 []  - Staff obtains Programmer, systems, Records, Test Results / Process Orders 0 []  - Staff telephones HHA, Nursing Homes / Clarify orders / etc 0 []  - Routine Transfer to another Facility (non-emergent condition) 0 Kropf, Ab A. ([id number]) []  - Routine Hospital Admission (non-emergent condition) 0 []  - New Admissions / Biomedical engineer / Ordering NPWT, Apligraf, etc. 0 []  - Emergency Hospital Admission (emergent condition) 0 X - Simple Discharge Coordination 1 10 []  - Complex (extensive) Discharge Coordination 0 PROCESS - Special Needs []  - Pediatric / Minor Patient Management 0 []  - Isolation Patient Management 0 []  - Hearing / Language / Visual special needs 0 []  - Assessment of Community assistance (transportation, D/C planning, etc.) 0 []  - Additional assistance / Altered mentation 0 []  - Support Surface(s) Assessment (bed, cushion, seat, etc.) 0 INTERVENTIONS - Wound Cleansing / Measurement X - Simple Wound Cleansing - one wound 1 5 []  - Complex Wound  Cleansing - multiple wounds 0 X - Wound Imaging (photographs - any number of wounds) 1 5 []  - Wound Tracing (instead of photographs) 0 X - Simple  Wound Measurement - one wound 1 5 []  - Complex Wound Measurement - multiple wounds 0 INTERVENTIONS - Wound Dressings X - Small Wound Dressing one or multiple wounds 1 10 []  - Medium Wound Dressing one or multiple wounds 0 []  - Large Wound Dressing one or multiple wounds 0 []  - Application of Medications - topical 0 []  - Application of Medications - injection 0 INTERVENTIONS - Miscellaneous []  - External ear exam 0 Wengert, Laakea A. ([id number]) []  - Specimen Collection (cultures, biopsies, blood, body fluids, etc.) 0 []  - Specimen(s) / Culture(s) sent or taken to Lab for analysis 0 []  - Patient Transfer (multiple staff / Harrel Lemon Lift / Similar devices) 0 []  - Simple Staple / Suture removal (25 or less) 0 []  - Complex Staple / Suture removal (26 or more) 0 []  - Hypo / Hyperglycemic Management (close monitor of Blood Glucose) 0 []  - Ankle / Brachial Index (ABI) - do not check if billed separately 0 X - Vital Signs 1 5 Has the patient been seen at the hospital within the last three years: Yes Total Score: 80 Level Of Care: New/Established - Level 3 Electronic Signature(s) Signed: 03/28/2015 5:43:56 PM By: Montey Hora Entered By: Montey Hora on 03/28/2015 14:20:18 [name] ([id number]) -------------------------------------------------------------------------------- Encounter Discharge Information Details Patient Name: [name], Isael A. Date of Service: 03/28/2015 2:00 PM Medical Record Number: [id number] Patient Account Number: [id number] Date of Birth/Sex: 08/27/1970 (44 y.o. Male) Treating RN: Montey Hora Primary Care Physician: Gayland Curry Other Clinician: Referring Physician: Gayland Curry Treating Physician/Extender: Frann Rider in Treatment: 2 Encounter Discharge Information Items Discharge  Pain Level: 0 Discharge Condition: Stable Ambulatory Status: Ambulatory Discharge Destination: Home Transportation: Private Auto Accompanied By: self Schedule Follow-up Appointment: Yes Medication Reconciliation completed and provided to Patient/Care No Riggin Cuttino: Provided on Clinical Summary of Care: 03/28/2015 Form Type Recipient Paper Patient [facility] Electronic Signature(s) Signed: 03/28/2015 2:24:50 PM By: Ruthine Dose Entered By: Ruthine Dose on 03/28/2015 14:24:50 Paradis, Heinz Loni Muse ([id number]) -------------------------------------------------------------------------------- Lower Extremity Assessment Details Patient Name: [name], Arjan A. Date of Service: 03/28/2015 2:00 PM Medical Record Number: [id number] Patient Account Number: [id number] Date of Birth/Sex: Jun 03, 1970 (44 y.o. Male) Treating RN: Montey Hora Primary Care Physician: Gayland Curry Other Clinician: Referring Physician: Gayland Curry Treating Physician/Extender: Frann Rider in Treatment: 2 Vascular Assessment Pulses: Posterior Tibial Dorsalis Pedis Palpable: [Right:Yes] Extremity colors, hair growth, and conditions: Extremity Color: [Right:Normal] Hair Growth on Extremity: [Right:Yes] Temperature of Extremity: [Right:Warm] Capillary Refill: [Right:< 3 seconds] Electronic Signature(s) Signed: 03/28/2015 5:43:56 PM By: Montey Hora Entered By: Montey Hora on 03/28/2015 14:00:10 [name] ([id number]) -------------------------------------------------------------------------------- Multi Wound Chart Details Patient Name: [name], Tavis A. Date of Service: 03/28/2015 2:00 PM Medical Record Number: [id number] Patient Account Number: [id number] Date of Birth/Sex: 1970/06/21 (44 y.o. Male) Treating RN: Montey Hora Primary Care Physician: Gayland Curry Other Clinician: Referring Physician: Gayland Curry Treating Physician/Extender: Frann Rider in  Treatment: 2 Vital Signs Height(in): 72 Pulse(bpm): 67 Weight(lbs): 360 Blood Pressure 141/66 (mmHg): Body Mass Index(BMI): 49 Temperature(F): 98.4 Respiratory Rate 18 (breaths/min): Photos: [1:No Photos] [N/A:N/A] Wound Location: [1:Left Lower Leg - Lateral] [N/A:N/A] Wounding Event: [1:Surgical Injury] [N/A:N/A] Primary Etiology: [1:Open Surgical Wound] [N/A:N/A] Comorbid History: [1:Sleep Apnea, Hypertension, Type II Diabetes, Gout] [N/A:N/A] Date Acquired: [1:01/30/2015] [N/A:N/A] Weeks of Treatment: [1:2] [N/A:N/A] Wound Status: [1:Open] [N/A:N/A] Measurements L x W x D 1.3x1.3x0.1 [N/A:N/A] (cm) Area (cm) : [1:1.327] [N/A:N/A] Volume (cm) : [1:0.133] [N/A:N/A] % Reduction in Area: [1:53.10%] [N/A:N/A] % Reduction in Volume: 53.00% [N/A:N/A]  Classification: [1:Full Thickness Without Exposed Support Structures] [N/A:N/A] HBO Classification: [1:Grade 1] [N/A:N/A] Exudate Amount: [1:Small] [N/A:N/A] Exudate Type: [1:Serous] [N/A:N/A] Exudate Color: [1:amber] [N/A:N/A] Wound Margin: [1:Flat and Intact] [N/A:N/A] Granulation Amount: [1:Large (67-100%)] [N/A:N/A] Granulation Quality: [1:Red] [N/A:N/A] Necrotic Amount: [1:Small (1-33%)] [N/A:N/A] Exposed Structures: [1:Fascia: No Fat: No Tendon: No] [N/A:N/A] Muscle: No Joint: No Bone: No Limited to Skin Breakdown Epithelialization: None N/A N/A Periwound Skin Texture: Edema: No N/A N/A Excoriation: No Induration: No Callus: No Crepitus: No Fluctuance: No Friable: No Rash: No Scarring: No Periwound Skin Maceration: No N/A N/A Moisture: Moist: No Dry/Scaly: No Periwound Skin Color: Erythema: Yes N/A N/A Atrophie Blanche: No Cyanosis: No Ecchymosis: No Hemosiderin Staining: No Mottled: No Pallor: No Rubor: No Erythema Location: Circumferential N/A N/A Tenderness on Yes N/A N/A Palpation: Wound Preparation: Ulcer Cleansing: N/A N/A Rinsed/Irrigated with Saline Topical Anesthetic Applied: Other:  lidocaine 4% Treatment Notes Electronic Signature(s) Signed: 03/28/2015 5:43:56 PM By: Montey Hora Entered By: Montey Hora on 03/28/2015 14:01:17 [name] ([id number]) -------------------------------------------------------------------------------- [place] Details Patient Name: [name], Ayeden A. Date of Service: 03/28/2015 2:00 PM Medical Record Number: [id number] Patient Account Number: [id number] Date of Birth/Sex: 08-06-1970 (44 y.o. Male) Treating RN: Montey Hora Primary Care Physician: Gayland Curry Other Clinician: Referring Physician: Gayland Curry Treating Physician/Extender: Frann Rider in Treatment: 2 Active Inactive Orientation to the Wound Care Program Nursing Diagnoses: Knowledge deficit related to the wound healing center program Goals: Patient/caregiver will verbalize understanding of the [place] Program Date Initiated: 03/14/2015 Goal Status: Active Interventions: Provide education on orientation to the wound center Notes: Wound/Skin Impairment Nursing Diagnoses: Impaired tissue integrity Goals: Patient/caregiver will verbalize understanding of skin care regimen Date Initiated: 03/14/2015 Goal Status: Active Ulcer/skin breakdown will have a volume reduction of 30% by week 4 Date Initiated: 03/14/2015 Goal Status: Active Ulcer/skin breakdown will have a volume reduction of 50% by week 8 Date Initiated: 03/14/2015 Goal Status: Active Ulcer/skin breakdown will have a volume reduction of 80% by week 12 Date Initiated: 03/14/2015 Goal Status: Active Ulcer/skin breakdown will heal within 14 weeks Date Initiated: 03/14/2015 Goal Status: Active KATHRYN, HUBANKS ([id number]) Interventions: Assess patient/caregiver ability to perform ulcer/skin care regimen upon admission and as needed Assess ulceration(s) every visit Notes: Electronic Signature(s) Signed: 03/28/2015 5:43:56 PM By:  Montey Hora Entered By: Montey Hora on 03/28/2015 14:01:11 [name] ([id number]) -------------------------------------------------------------------------------- Patient/Caregiver Education Details Patient Name: [name], Boleslaus A. Date of Service: 03/28/2015 2:00 PM Medical Record Number: [id number] Patient Account Number: [id number] Date of Birth/Gender: [birth year] (44 y.o. Male) Treating RN: Montey Hora Primary Care Physician: Gayland Curry Other Clinician: Referring Physician: Gayland Curry Treating Physician/Extender: Frann Rider in Treatment: 2 Education Assessment Education Provided To: Patient Education Topics Provided Wound/Skin Impairment: Handouts: Other: wound care as ordered Methods: Demonstration, Explain/Verbal Responses: State content correctly Electronic Signature(s) Signed: 03/28/2015 5:43:56 PM By: Montey Hora Entered By: Montey Hora on 03/28/2015 14:24:40 Timberlake, Siddh Loni Muse ([id number]) -------------------------------------------------------------------------------- Wound Assessment Details Patient Name: [name], Erique A. Date of Service: 03/28/2015 2:00 PM Medical Record Number: [id number] Patient Account Number: [id number] Date of Birth/Sex: December 25, 1970 (44 y.o. Male) Treating RN: Montey Hora Primary Care Physician: Gayland Curry Other Clinician: Referring Physician: Gayland Curry Treating Physician/Extender: Frann Rider in Treatment: 2 Wound Status Wound Number: 1 Primary Open Surgical Wound Etiology: Wound Location: Left Lower Leg - Lateral Wound Status: Open Wounding Event: Surgical Injury Comorbid Sleep Apnea, Hypertension, Type II Date Acquired: 01/30/2015 History: Diabetes, Gout Weeks Of Treatment: 2 Clustered Wound: No  Photos Photo Uploaded By: Montey Hora on 03/28/2015 14:34:09 Wound Measurements Length: (cm) 1.3 Width: (cm) 1.3 Depth: (cm) 0.1 Area: (cm) 1.327 Volume:  (cm) 0.133 % Reduction in Area: 53.1% % Reduction in Volume: 53% Epithelialization: None Tunneling: No Undermining: No Wound Description Full Thickness Without Exposed Classification: Support Structures Diabetic Severity Grade 1 (Wagner): Wound Margin: Flat and Intact Exudate Amount: Small Exudate Type: Serous Exudate Color: amber Wound Bed Granulation Amount: Large (67-100%) Exposed Structure Granulation Quality: Red Fascia Exposed: No Fullilove, Klever A. ([id number]) Necrotic Amount: Small (1-33%) Fat Layer Exposed: No Necrotic Quality: Adherent Slough Tendon Exposed: No Muscle Exposed: No Joint Exposed: No Bone Exposed: No Limited to Skin Breakdown Periwound Skin Texture Texture Color No Abnormalities Noted: No No Abnormalities Noted: No Callus: No Atrophie Blanche: No Crepitus: No Cyanosis: No Excoriation: No Ecchymosis: No Fluctuance: No Erythema: Yes Friable: No Erythema Location: Circumferential Induration: No Hemosiderin Staining: No Localized Edema: No Mottled: No Rash: No Pallor: No Scarring: No Rubor: No Moisture Temperature / Pain No Abnormalities Noted: No Tenderness on Palpation: Yes Dry / Scaly: No Maceration: No Moist: No Wound Preparation Ulcer Cleansing: Rinsed/Irrigated with Saline Topical Anesthetic Applied: Other: lidocaine 4%, Treatment Notes Wound #1 (Left, Lateral Lower Leg) 1. Cleansed with: Clean wound with Normal Saline 2. Anesthetic Topical Lidocaine 4% cream to wound bed prior to debridement 3. Peri-wound Care: Skin Prep 4. Dressing Applied: Other dressing (specify in notes) 5. Secondary Dressing Applied Bordered Foam Dressing Notes sorbact Electronic Signature(s) Signed: 03/28/2015 5:43:56 PM By: Gayla Doss, Tyland A. ([id number]) Entered By: Montey Hora on 03/28/2015 14:01:04 [name]  ([id number]) -------------------------------------------------------------------------------- [place] Details Patient Name: [name], Ha A. Date of Service: 03/28/2015 2:00 PM Medical Record Number: [id number] Patient Account Number: [id number] Date of Birth/Sex: 18-Feb-1971 (44 y.o. Male) Treating RN: Montey Hora Primary Care Physician: Gayland Curry Other Clinician: Referring Physician: Gayland Curry Treating Physician/Extender: Frann Rider in Treatment: 2 Vital Signs Time Taken: 13:55 Temperature (F): 98.4 Height (in): 72 Pulse (bpm): 67 Weight (lbs): 360 Respiratory Rate (breaths/min): 18 Body Mass Index (BMI): 48.8 Blood Pressure (mmHg): 141/66 Reference Range: 80 - 120 mg / dl Electronic Signature(s) Signed: 03/28/2015 5:43:56 PM By: Montey Hora Entered By: Montey Hora on 03/28/2015 13:57:01

## 2015-03-29 NOTE — Progress Notes (Signed)
Jacob [name] ([id number]) Visit Report for 03/28/2015 Chief Complaint Document Details Patient Name: Jacob [name], Jacob [name]. Date of Service: 03/28/2015 2:00 PM Medical Record Number: [id number] Patient Account Number: [id number] Date of Birth/Sex: 1971-02-12 (44 y.o. Male) Treating RN: Montey Hora Primary Care Physician: Gayland Curry Other Clinician: Referring Physician: Gayland Curry Treating Physician/Extender: Frann Rider in Treatment: 2 Information Obtained from: Patient Chief Complaint Patient presents to the wound care center for [name] consult due non healing wound of the left lower extremity which he has had for about [name] month and [name] half. Electronic Signature(s) Signed: 03/28/2015 2:39:19 PM By: Christin Fudge MD, FACS Entered By: Christin Fudge on 03/28/2015 14:39:18 [name] ([id number]) -------------------------------------------------------------------------------- HPI Details Patient Name: Jacob [name], Jacob [name]. Date of Service: 03/28/2015 2:00 PM Medical Record Number: [id number] Patient Account Number: [id number] Date of Birth/Sex: 1971-02-15 (44 y.o. Male) Treating RN: Montey Hora Primary Care Physician: Gayland Curry Other Clinician: Referring Physician: Gayland Curry Treating Physician/Extender: Frann Rider in Treatment: 2 History of Present Illness Location: left lower extremity laterally Quality: Patient reports experiencing [name] dull pain to affected area(s). Severity: Patient states wound (s) are getting better. Duration: Patient has had the wound for < 6 weeks prior to presenting for treatment Timing: Pain in wound is Intermittent (comes and goes Context: The wound occurred when the patient had [name] shave biopsy of [name] lesion on that leg which was benign Modifying Factors: Other treatment(s) tried include:was put on Keflex and Bactrim by his PCP Associated Signs and Symptoms: Patient reports presence of swelling HPI  Description: Pleasant 44 year old gentleman who had [name] shave biopsy done from his left lower extremity on October 3 and the wound has not healed since then. This was [name] benign disease and he was concerned about the length of time it has taken to heal. He has past medical history of diabetes mellitus, gout, hypertension, sleep apnea and is status post tonsillectomy and adenoidectomy, cholecystectomy and uvulopalatopharyngoplasty. he was recently seen by his PCP Dr. Jodi Mourning who had already given him Keflex for [name] week and then put him on Bactrim for 10 days. the patient was [name] smoker before and quit in 1995 but currently chews tobacco on [name] daily basis. Electronic Signature(s) Signed: 03/28/2015 2:39:25 PM By: Christin Fudge MD, FACS Entered By: Christin Fudge on 03/28/2015 14:39:24 [name] ([id number]) -------------------------------------------------------------------------------- Physical Exam Details Patient Name: Jacob [name], Jacob [name]. Date of Service: 03/28/2015 2:00 PM Medical Record Number: [id number] Patient Account Number: [id number] Date of Birth/Sex: 08/22/1970 (44 y.o. Male) Treating RN: Montey Hora Primary Care Physician: Gayland Curry Other Clinician: Referring Physician: Gayland Curry Treating Physician/Extender: Frann Rider in Treatment: 2 Constitutional . Pulse regular. Respirations normal and unlabored. Afebrile. . Eyes Nonicteric. Reactive to light. Ears, Nose, Mouth, and Throat Lips, teeth, and gums WNL.Marland Kitchen Moist mucosa without lesions . Neck supple and nontender. No palpable supraclavicular or cervical adenopathy. Normal sized without goiter. Respiratory WNL. No retractions.. Cardiovascular Pedal Pulses WNL. No clubbing, cyanosis or edema. Lymphatic No adneopathy. No adenopathy. No adenopathy. Musculoskeletal Adexa without tenderness or enlargement.. Digits and nails w/o clubbing, cyanosis, infection, petechiae, ischemia, or  inflammatory conditions.. Integumentary (Hair, Skin) No suspicious lesions. No crepitus or fluctuance. No peri-wound warmth or erythema. No masses.Marland Kitchen Psychiatric Judgement and insight Intact.. No evidence of depression, anxiety, or agitation.. Notes there is no slough today and after washing this area with moist saline gauze it looks good. Electronic Signature(s) Signed: 03/28/2015 2:39:51 PM By: Christin Fudge MD, FACS Entered By: Christin Fudge  on 03/28/2015 14:39:51 EDWIN, DUDEK ([id number]) -------------------------------------------------------------------------------- Physician Orders Details Patient Name: Jacob [name], Jacob [name]. Date of Service: 03/28/2015 2:00 PM Medical Record Number: [id number] Patient Account Number: [id number] Date of Birth/Sex: 1970/10/06 (44 y.o. Male) Treating RN: Montey Hora Primary Care Physician: Gayland Curry Other Clinician: Referring Physician: Gayland Curry Treating Physician/Extender: Frann Rider in Treatment: 2 Verbal / Phone Orders: Yes Clinician: Montey Hora Read Back and Verified: Yes Diagnosis Coding Wound Cleansing Wound #1 Left,Lateral Lower Leg o Clean wound with Normal Saline. Anesthetic Wound #1 Left,Lateral Lower Leg o Topical Lidocaine 4% cream applied to wound bed prior to debridement Skin Barriers/Peri-Wound Care Wound #1 Left,Lateral Lower Leg o Skin Prep Primary Wound Dressing Wound #1 Left,Lateral Lower Leg o Other: - sorbact Secondary Dressing Wound #1 Left,Lateral Lower Leg o Boardered Foam Dressing Dressing Change Frequency Wound #1 Left,Lateral Lower Leg o Three times weekly Follow-up Appointments Wound #1 Left,Lateral Lower Leg o Return Appointment in 1 week. Additional Orders / Instructions Wound #1 Left,Lateral Lower Leg o Other: - quit tobacco products Electronic Signature(s) CATALINO, GURRY ([id number]) Signed: 03/28/2015 5:43:56 PM By: Montey Hora Signed:  03/28/2015 5:47:05 PM By: Christin Fudge MD, FACS Entered By: Montey Hora on 03/28/2015 14:18:44 [name] ([id number]) -------------------------------------------------------------------------------- Problem List Details Patient Name: Jacob [name], Fady [name]. Date of Service: 03/28/2015 2:00 PM Medical Record Number: [id number] Patient Account Number: [id number] Date of Birth/Sex: [birth year] (44 y.o. 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 Date of Service: 03/28/2015 2:00 PM Medical Record Number: [id number] Patient Account Number: [id number] Date of Birth/Sex: 01-07-1971 (44 y.o. Male) Treating RN: Montey Hora Primary Care Physician: Gayland Curry Other Clinician: Referring Physician: Gayland Curry Treating Physician/Extender: Frann Rider in Treatment: 2 Subjective Chief Complaint Information obtained from Patient Patient presents to the wound care center  for [name] consult due non healing wound of the left lower extremity which he has had for about [name] month and [name] half. History of Present Illness (HPI) The following HPI elements were documented for the patient's wound: Location: left lower extremity laterally Quality: Patient reports experiencing [name] dull pain to affected area(s). Severity: Patient states wound (s) are getting better. Duration: Patient has had the wound for < 6 weeks prior to presenting for treatment Timing: Pain in wound is Intermittent (comes and goes Context: The wound occurred when the patient had [name] shave biopsy of [name] lesion on that leg which was benign Modifying Factors: Other treatment(s) tried include:was put on Keflex and Bactrim by his PCP Associated Signs and Symptoms: Patient reports presence of swelling Pleasant 44 year old gentleman who had [name] shave biopsy done from his left lower extremity on October 3 and the wound has not healed since then. This was [name] benign disease and he was concerned about the length of time it has taken to  heal. He has past medical history of diabetes mellitus, gout, hypertension, sleep apnea and is status post tonsillectomy and adenoidectomy, cholecystectomy and uvulopalatopharyngoplasty. he was recently seen by his PCP Dr. Jodi Mourning who had already given him Keflex for [name] week and then put him on Bactrim for 10 days. the patient was [name] smoker before and quit in 1995 but currently chews tobacco on [name] daily basis. Objective Constitutional Pulse regular. Respirations normal and unlabored. Afebrile. Vitals Time Taken: 1:55 PM, Height: 72 in, Weight: 360 lbs, BMI: 48.8, Temperature: 98.4 F, Pulse: 67 bpm, Respiratory Rate: 18 breaths/min, Blood Pressure: 141/66 mmHg. Teodoro, Gifford AMarland Kitchen ([id number]) Eyes Nonicteric. Reactive to light. Ears, Nose, Mouth, and Throat Lips, teeth, and gums WNL.Marland Kitchen Moist mucosa without lesions . Neck supple and nontender. No palpable supraclavicular or cervical  adenopathy. Normal sized without goiter. Respiratory WNL. No retractions.. Cardiovascular Pedal Pulses WNL. No clubbing, cyanosis or edema. Lymphatic No adneopathy. No adenopathy. No adenopathy. Musculoskeletal Adexa without tenderness or enlargement.. Digits and nails w/o clubbing, cyanosis, infection, petechiae, ischemia, or inflammatory conditions.Marland Kitchen Psychiatric Judgement and insight Intact.. No evidence of depression, anxiety, or agitation.. General Notes: there is no slough today and after washing this area with moist saline gauze it looks good. Integumentary (Hair, Skin) No suspicious lesions. No crepitus or fluctuance. No peri-wound warmth or erythema. No masses.. Wound #1 status is Open. Original cause of wound was Surgical Injury. The wound is located on the Left,Lateral Lower Leg. The wound measures 1.3cm length x 1.3cm width x 0.1cm depth; 1.327cm^2 area and 0.133cm^3 volume. The wound is limited to skin breakdown. There is no tunneling or undermining noted. There is [name] small amount of serous drainage noted. The wound margin is flat and intact. There is large (67-100%) red granulation within the wound bed. There is [name] small (1-33%) amount of necrotic tissue within the wound bed including Adherent Slough. The periwound skin appearance exhibited: Erythema. The periwound skin appearance did not exhibit: Callus, Crepitus, Excoriation, Fluctuance, Friable, Induration, Localized Edema, Rash, Scarring, Dry/Scaly, Maceration, Moist, Atrophie Blanche, Cyanosis, Ecchymosis, Hemosiderin Staining, Mottled, Pallor, Rubor. The surrounding wound skin color is noted with erythema which is circumferential. The periwound has tenderness on palpation. Assessment Active Problems Hice, Larrie [name]. ([id number]) ICD-10 E11.622 - Type 2 diabetes mellitus with other skin ulcer T81.31XA - Disruption of external operation (surgical) wound, not elsewhere classified, initial encounter L97.222 -  Non-pressure chronic ulcer of left calf with fat layer exposed F17.228 - Nicotine dependence, chewing tobacco, with other nicotine-induced disorders E66.01 - Morbid (severe) obesity due to excess calories I have recommended [name] piece of Sorbact with bordered foam to be applied 2 or 3 times [name] week. He will come back and see as next week. Plan Wound Cleansing: Wound #1 Left,Lateral Lower Leg: Clean wound with Normal Saline. Anesthetic: Wound #1 Left,Lateral Lower Leg: Topical Lidocaine 4% cream applied to wound bed prior to debridement Skin Barriers/Peri-Wound Care: Wound #1 Left,Lateral Lower Leg: Skin Prep Primary Wound Dressing: Wound #1 Left,Lateral Lower Leg: Other: - sorbact Secondary Dressing: Wound #1 Left,Lateral Lower Leg: Boardered Foam Dressing Dressing Change Frequency: Wound #1 Left,Lateral Lower Leg: Three times weekly Follow-up Appointments: Wound #1 Left,Lateral Lower Leg: Return Appointment in 1 week. Additional Orders / Instructions: Wound #1 Left,Lateral Lower Leg: Other: - quit tobacco products Mcadams, Duriel [name]. ([id number]) I have recommended [name] piece of Sorbact with bordered foam to be applied 2 or 3 times [name] week. He will come back and see as next week. Electronic Signature(s) Signed:  03/28/2015 2:40:40 PM By: Christin Fudge MD, FACS Entered By: Christin Fudge on 03/28/2015 14:40:39 [name] ([id number]) -------------------------------------------------------------------------------- SuperBill Details Patient Name: Jacob [name], Coyt [name]. Date of Service: 03/28/2015 Medical Record Number: [id number] Patient Account Number: [id number] Date of Birth/Sex: 1971-02-10 (44 y.o. Male) Treating RN: Montey Hora Primary Care Physician: Gayland Curry Other Clinician: Referring Physician: Gayland Curry Treating Physician/Extender: Frann Rider in Treatment: 2 Diagnosis Coding ICD-10 Codes Code Description [id number] Type 2 diabetes mellitus  with other skin ulcer Disruption of external operation (surgical) wound, not elsewhere classified, initial T81.31XA encounter L97.222 Non-pressure chronic ulcer of left calf with fat layer exposed F17.228 Nicotine dependence, chewing tobacco, with other nicotine-induced disorders E66.01 Morbid (severe) obesity due to excess calories Facility Procedures CPT4 Code: [id number] Description: 99213 - WOUND CARE VISIT-LEV 3 EST PT Modifier: Quantity: 1 Physician Procedures CPT4: Description Modifier Quantity Code [id number] - WC PHYS LEVEL 3 - EST PT 1 ICD-10 Description Diagnosis E11.622 Type 2 diabetes mellitus with other skin ulcer T81.31XA Disruption of external operation (surgical) wound, not elsewhere classified,  initial encounter L97.222 Non-pressure chronic ulcer of left calf with fat layer exposed F17.228 Nicotine dependence, chewing tobacco, with other nicotine-induced disorders Electronic Signature(s) Signed: 03/28/2015 2:41:10 PM By: Christin Fudge MD, FACS Entered By: Christin Fudge on 03/28/2015 14:41:10

## 2015-04-04 ENCOUNTER — Encounter: Payer: BC Managed Care – PPO | Attending: Surgery | Admitting: Surgery

## 2015-04-04 ENCOUNTER — Ambulatory Visit
Admission: RE | Admit: 2015-04-04 | Discharge: 2015-04-04 | Disposition: A | Discharge status: Home / Self Care | Payer: BC Managed Care – PPO | Source: Ambulatory Visit | Attending: Family Medicine | Admitting: Family Medicine

## 2015-04-04 DIAGNOSIS — F17228 Nicotine dependence, chewing tobacco, with other nicotine-induced disorders: Secondary | ICD-10-CM | POA: Insufficient documentation

## 2015-04-04 DIAGNOSIS — E11622 Type 2 diabetes mellitus with other skin ulcer: Secondary | ICD-10-CM | POA: Diagnosis present

## 2015-04-04 DIAGNOSIS — D352 Benign neoplasm of pituitary gland: Secondary | ICD-10-CM

## 2015-04-04 DIAGNOSIS — L97222 Non-pressure chronic ulcer of left calf with fat layer exposed: Secondary | ICD-10-CM | POA: Insufficient documentation

## 2015-04-04 DIAGNOSIS — G4730 Sleep apnea, unspecified: Secondary | ICD-10-CM | POA: Insufficient documentation

## 2015-04-04 DIAGNOSIS — M109 Gout, unspecified: Secondary | ICD-10-CM | POA: Diagnosis not present

## 2015-04-04 DIAGNOSIS — T8131XA Disruption of external operation (surgical) wound, not elsewhere classified, initial encounter: Secondary | ICD-10-CM | POA: Insufficient documentation

## 2015-04-04 DIAGNOSIS — I10 Essential (primary) hypertension: Secondary | ICD-10-CM | POA: Diagnosis not present

## 2015-04-04 DIAGNOSIS — X58XXXA Exposure to other specified factors, initial encounter: Secondary | ICD-10-CM | POA: Diagnosis not present

## 2015-04-04 MED ORDER — GADOBENATE DIMEGLUMINE 529 MG/ML IV SOLN
10.0000 mL | Freq: Once | INTRAVENOUS | Status: AC | PRN
  Administered 2015-04-04: 10 mL via INTRAVENOUS

## 2015-04-04 NOTE — Progress Notes (Addendum)
JAMOL, REIM ([id number]) Visit Report for 04/04/2015 Chief Complaint Document Details Patient Name: Jacob [name], [name] Date of Service: 04/04/2015 2:00 PM Medical Record Number: [id number] Patient Account Number: [id number] Date of Birth/Sex: [birth year] (44 y.o. Male) Treating RN: Ahmed Prima Primary Care Physician: Gayland Curry Other Clinician: Referring Physician: Gayland Curry Treating Physician/Extender: Frann Rider in Treatment: 3 Information Obtained from: Patient Chief Complaint Patient presents to the wound care center for a consult due non healing wound of the left lower extremity which he has had for about a month and a half. Electronic Signature(s) Signed: 04/04/2015 2:18:03 PM By: Christin Fudge MD, FACS Entered By: Christin Fudge on 04/04/2015 14:18:03 [name] ([id number]) -------------------------------------------------------------------------------- Debridement Details Patient Name: Jacob [name], [name] Date of Service: 04/04/2015 2:00 PM Medical Record Number: [id number] Patient Account Number: [id number] Date of Birth/Sex: 06-15-1970 (44 y.o. Male) Treating RN: Ahmed Prima Primary Care Physician: Gayland Curry Other Clinician: Referring Physician: Gayland Curry Treating Physician/Extender: Frann Rider in Treatment: 3 Debridement Performed for Wound #1 Left,Lateral Lower Leg Assessment: Performed By: Physician Christin Fudge, MD Debridement: Open Wound/Selective Debridement Selective Description: Pre-procedure Yes Verification/Time Out Taken: Start Time: 02:10 Pain Control: Lidocaine 5% topical ointment Level: Non-Viable Tissue Total Area Debrided (L x 1.5 (cm) x 1.5 (cm) = 2.25 (cm) W): Tissue and other Eschar, Fibrin/Slough material debrided: Instrument: Other : saline gauze Bleeding: Moderate Hemostasis Achieved: Silver Nitrate End Time: 02:18 Procedural Pain: 0 Post Procedural Pain:  0 Response to Treatment: Procedure was tolerated well Post Debridement Measurements of Total Wound Length: (cm) 1.5 Width: (cm) 1.5 Depth: (cm) 0.1 Volume: (cm) 0.177 Post Procedure Diagnosis Same as Pre-procedure Electronic Signature(s) Signed: 04/04/2015 2:17:57 PM By: Christin Fudge MD, FACS Signed: 04/04/2015 6:00:56 PM By: Alric Quan Entered By: Christin Fudge on 04/04/2015 14:17:57 [name], [name] ([id number]) -------------------------------------------------------------------------------- HPI Details Patient Name: Jacob [name], [name] Date of Service: 04/04/2015 2:00 PM Medical Record Number: [id number] Patient Account Number: [id number] Date of Birth/Sex: April 08, 1971 (44 y.o. Male) Treating RN: Ahmed Prima Primary Care Physician: Gayland Curry Other Clinician: Referring Physician: Gayland Curry Treating Physician/Extender: Frann Rider in Treatment: 3 History of Present Illness Location: left lower extremity laterally Quality: Patient reports experiencing a dull pain to affected area(s). Severity: Patient states wound (s) are getting better. Duration: Patient has had the wound for < 6 weeks prior to presenting for treatment Timing: Pain in wound is Intermittent (comes and goes Context: The wound occurred when the patient had a shave biopsy of a lesion on that leg which was benign Modifying Factors: Other treatment(s) tried include:was put on Keflex and Bactrim by his PCP Associated Signs and Symptoms: Patient reports presence of swelling HPI Description: Pleasant 44 year old gentleman who had a shave biopsy done from his left lower extremity on October 3 and the wound has not healed since then. This was a benign disease and he was concerned about the length of time it has taken to heal. He has past medical history of diabetes mellitus, gout, hypertension, sleep apnea and is status post tonsillectomy and adenoidectomy, cholecystectomy and  uvulopalatopharyngoplasty. he was recently seen by his PCP Dr. Jodi Mourning who had already given him Keflex for a week and then put him on Bactrim for 10 days. the patient was a smoker before and quit in 1995 but currently chews tobacco on a daily basis. Electronic Signature(s) Signed: 04/04/2015 2:18:07 PM By: Christin Fudge MD, FACS Entered By: Christin Fudge on 04/04/2015 14:18:07 [name] ([id number]) -------------------------------------------------------------------------------- Physical Exam Details Patient  Name: Jacob [name], [name] Date of Service: 04/04/2015 2:00 PM Medical Record Number: [id number] Patient Account Number: [id number] Date of Birth/Sex: [birth year] (44 y.o. Male) Treating RN: Ahmed Prima Primary Care Physician: Gayland Curry Other Clinician: Referring Physician: Gayland Curry Treating Physician/Extender: Frann Rider in Treatment: 3 Constitutional . Pulse regular. Respirations normal and unlabored. Afebrile. . Eyes Nonicteric. Reactive to light. Ears, Nose, Mouth, and Throat Lips, teeth, and gums WNL.Marland Kitchen Moist mucosa without lesions . Neck supple and nontender. No palpable supraclavicular or cervical adenopathy. Normal sized without goiter. Respiratory WNL. No retractions.. Cardiovascular Pedal Pulses WNL. No clubbing, cyanosis or edema. Chest Breasts symmetical and no nipple discharge.. Breast tissue WNL, no masses, lumps, or tenderness.. Lymphatic No adneopathy. No adenopathy. No adenopathy. Musculoskeletal Adexa without tenderness or enlargement.. Digits and nails w/o clubbing, cyanosis, infection, petechiae, ischemia, or inflammatory conditions.. Integumentary (Hair, Skin) No suspicious lesions. No crepitus or fluctuance. No peri-wound warmth or erythema. No masses.Marland Kitchen Psychiatric Judgement and insight Intact.. No evidence of depression, anxiety, or agitation.. Notes some of the slough was cleansed with a moist saline gauze  and there was significant amount of bleeding from the hyper granulation tissue which was cauterized with silver nitrate. Electronic Signature(s) Signed: 04/04/2015 2:18:32 PM By: Christin Fudge MD, FACS Entered By: Christin Fudge on 04/04/2015 14:18:32 [name] ([id number]) -------------------------------------------------------------------------------- Physician Orders Details Patient Name: Jacob [name], [name] Date of Service: 04/04/2015 2:00 PM Medical Record Number: [id number] Patient Account Number: [id number] Date of Birth/Sex: 1971-02-24 (44 y.o. Male) Treating RN: Ahmed Prima Primary Care Physician: Gayland Curry Other Clinician: Referring Physician: Gayland Curry Treating Physician/Extender: Frann Rider in Treatment: 3 Verbal / Phone Orders: Yes Clinician: Carolyne Fiscal, Debi Read Back and Verified: Yes Diagnosis Coding ICD-10 Coding Code Description E11.622 Type 2 diabetes mellitus with other skin ulcer Disruption of external operation (surgical) wound, not elsewhere classified, initial T81.31XA encounter L97.222 Non-pressure chronic ulcer of left calf with fat layer exposed F17.228 Nicotine dependence, chewing tobacco, with other nicotine-induced disorders E66.01 Morbid (severe) obesity due to excess calories Wound Cleansing Wound #1 Left,Lateral Lower Leg o Clean wound with Normal Saline. Anesthetic Wound #1 Left,Lateral Lower Leg o Topical Lidocaine 4% cream applied to wound bed prior to debridement Skin Barriers/Peri-Wound Care Wound #1 Left,Lateral Lower Leg o Skin Prep Primary Wound Dressing Wound #1 Left,Lateral Lower Leg o Other: - sorbact Secondary Dressing Wound #1 Left,Lateral Lower Leg o Boardered Foam Dressing Dressing Change Frequency Wound #1 Left,Lateral Lower Leg o Three times weekly Follow-up Appointments Jacob [name], [name] ([id number]) Wound #1 Left,Lateral Lower Leg o Return Appointment in 1  week. Additional Orders / Instructions Wound #1 Left,Lateral Lower Leg o Other: - quit tobacco products Electronic Signature(s) Signed: 04/04/2015 4:34:28 PM By: Christin Fudge MD, FACS Signed: 04/04/2015 6:00:56 PM By: Alric Quan Entered By: Alric Quan on 04/04/2015 14:19:56 [name] ([id number]) -------------------------------------------------------------------------------- Problem List Details Patient Name: Jacob [name], [name] Date of Service: 04/04/2015 2:00 PM Medical Record Number: [id number] Patient Account Number: [id number] Date of Birth/Sex: [birth year] (44 y.o. Male) Treating RN: Ahmed Prima Primary Care Physician: Gayland Curry Other Clinician: Referring Physician: Gayland Curry Treating Physician/Extender: Frann Rider in Treatment: 3 Active Problems ICD-10 Encounter Code Description Active Date Diagnosis E11.622 Type 2 diabetes mellitus with other skin ulcer 03/14/2015 Yes T81.31XA Disruption of external operation (surgical) wound, not 03/14/2015 Yes elsewhere classified, initial encounter L97.222 Non-pressure chronic ulcer of left calf with fat layer 03/14/2015 Yes exposed F17.228 Nicotine dependence, chewing tobacco, with other 03/14/2015 Yes nicotine-induced disorders E66.01 Morbid (  severe) obesity due to excess calories 03/14/2015 Yes Inactive Problems Resolved Problems Electronic Signature(s) Signed: 04/04/2015 2:16:22 PM By: Christin Fudge MD, FACS Entered By: Christin Fudge on 04/04/2015 14:16:22 Kuntzman, Ilay [name] Kitchen ([id number]) -------------------------------------------------------------------------------- Progress Note Details Patient Name: Jacob [name], [name] Date of Service: 04/04/2015 2:00 PM Medical Record Number: [id number] Patient Account Number: [id number] Date of Birth/Sex: January 01, 1971 (44 y.o. Male) Treating RN: Ahmed Prima Primary Care Physician: Gayland Curry Other Clinician: Referring Physician:  Gayland Curry Treating Physician/Extender: Frann Rider in Treatment: 3 Subjective Chief Complaint Information obtained from Patient Patient presents to the wound care center for a consult due non healing wound of the left lower extremity which he has had for about a month and a half. History of Present Illness (HPI) The following HPI elements were documented for the patient's wound: Location: left lower extremity laterally Quality: Patient reports experiencing a dull pain to affected area(s). Severity: Patient states wound (s) are getting better. Duration: Patient has had the wound for < 6 weeks prior to presenting for treatment Timing: Pain in wound is Intermittent (comes and goes Context: The wound occurred when the patient had a shave biopsy of a lesion on that leg which was benign Modifying Factors: Other treatment(s) tried include:was put on Keflex and Bactrim by his PCP Associated Signs and Symptoms: Patient reports presence of swelling Pleasant 44 year old gentleman who had a shave biopsy done from his left lower extremity on October 3 and the wound has not healed since then. This was a benign disease and he was concerned about the length of time it has taken to heal. He has past medical history of diabetes mellitus, gout, hypertension, sleep apnea and is status post tonsillectomy and adenoidectomy, cholecystectomy and uvulopalatopharyngoplasty. he was recently seen by his PCP Dr. Jodi Mourning who had already given him Keflex for a week and then put him on Bactrim for 10 days. the patient was a smoker before and quit in 1995 but currently chews tobacco on a daily basis. Objective Constitutional Pulse regular. Respirations normal and unlabored. Afebrile. Vitals Time Taken: 2:02 PM, Height: 72 in, Weight: 360 lbs, BMI: 48.8, Temperature: 97.9 F, Pulse: 61 bpm, Respiratory Rate: 20 breaths/min, Blood Pressure: 166/87 mmHg. Jacob [name], Jacob [name] Kitchen  ([id number]) Eyes Nonicteric. Reactive to light. Ears, Nose, Mouth, and Throat Lips, teeth, and gums WNL.Marland Kitchen Moist mucosa without lesions . Neck supple and nontender. No palpable supraclavicular or cervical adenopathy. Normal sized without goiter. Respiratory WNL. No retractions.. Cardiovascular Pedal Pulses WNL. No clubbing, cyanosis or edema. Chest Breasts symmetical and no nipple discharge.. Breast tissue WNL, no masses, lumps, or tenderness.. Lymphatic No adneopathy. No adenopathy. No adenopathy. Musculoskeletal Adexa without tenderness or enlargement.. Digits and nails w/o clubbing, cyanosis, infection, petechiae, ischemia, or inflammatory conditions.Marland Kitchen Psychiatric Judgement and insight Intact.. No evidence of depression, anxiety, or agitation.. General Notes: some of the slough was cleansed with a moist saline gauze and there was significant amount of bleeding from the hyper granulation tissue which was cauterized with silver nitrate. Integumentary (Hair, Skin) No suspicious lesions. No crepitus or fluctuance. No peri-wound warmth or erythema. No masses.. Wound #1 status is Open. Original cause of wound was Surgical Injury. The wound is located on the Left,Lateral Lower Leg. The wound measures 1.5cm length x 1.5cm width x 0.1cm depth; 1.767cm^2 area and 0.177cm^3 volume. The wound is limited to skin breakdown. There is no tunneling or undermining noted. There is a small amount of serous drainage noted. The wound margin is flat and intact. There is large (  67-100%) red granulation within the wound bed. There is a small (1-33%) amount of necrotic tissue within the wound bed including Adherent Slough. The periwound skin appearance exhibited: Moist, Erythema. The periwound skin appearance did not exhibit: Callus, Crepitus, Excoriation, Fluctuance, Friable, Induration, Localized Edema, Rash, Scarring, Dry/Scaly, Maceration, Atrophie Blanche, Cyanosis, Ecchymosis, Hemosiderin Staining,  Mottled, Pallor, Rubor. The surrounding wound skin color is noted with erythema which is circumferential. Periwound temperature was noted as No Abnormality. The periwound has tenderness on palpation. Jacob [name], Jacob [name] ([id number]) Assessment Active Problems ICD-10 E11.622 - Type 2 diabetes mellitus with other skin ulcer T81.31XA - Disruption of external operation (surgical) wound, not elsewhere classified, initial encounter L97.222 - Non-pressure chronic ulcer of left calf with fat layer exposed F17.228 - Nicotine dependence, chewing tobacco, with other nicotine-induced disorders E66.01 - Morbid (severe) obesity due to excess calories The wound looks healthy and has minimal debris and hyperventilation tissue and has been treated appropriately. I have recommended a piece of Sorbact with bordered foam to be applied 2 or 3 times a week. He will come back and see as next week. Procedures Wound #1 Wound #1 is an Open Surgical Wound located on the Left,Lateral Lower Leg . There was a Non-Viable Tissue Open Wound/Selective [phone]) debridement with total area of 2.25 sq cm performed by Christin Fudge, MD. with the following instrument(s): saline gauze including Fibrin/Slough and Eschar after achieving pain control using Lidocaine 5% topical ointment. A time out was conducted prior to the start of the procedure. A Moderate amount of bleeding was controlled with Silver Nitrate. The procedure was tolerated well with a pain level of 0 throughout and a pain level of 0 following the procedure. Post Debridement Measurements: 1.5cm length x 1.5cm width x 0.1cm depth; 0.177cm^3 volume. Post procedure Diagnosis Wound #1: Same as Pre-Procedure Plan Wound Cleansing: Wound #1 Left,Lateral Lower Leg: Clean wound with Normal Saline. Anesthetic: FAIZON, RUMBAUGH ([id number]) Wound #1 Left,Lateral Lower Leg: Topical Lidocaine 4% cream applied to wound bed prior to debridement Skin  Barriers/Peri-Wound Care: Wound #1 Left,Lateral Lower Leg: Skin Prep Primary Wound Dressing: Wound #1 Left,Lateral Lower Leg: Other: - sorbact Secondary Dressing: Wound #1 Left,Lateral Lower Leg: Boardered Foam Dressing Dressing Change Frequency: Wound #1 Left,Lateral Lower Leg: Three times weekly Follow-up Appointments: Wound #1 Left,Lateral Lower Leg: Return Appointment in 1 week. Additional Orders / Instructions: Wound #1 Left,Lateral Lower Leg: Other: - quit tobacco products The wound looks healthy and has minimal debris and hyperventilation tissue and has been treated appropriately. I have recommended a piece of Sorbact with bordered foam to be applied 2 or 3 times a week. He will come back and see as next week. Electronic Signature(s) Signed: 04/04/2015 4:40:57 PM By: Christin Fudge MD, FACS Previous Signature: 04/04/2015 2:19:37 PM Version By: Christin Fudge MD, FACS Entered By: Christin Fudge on 04/04/2015 16:40:57 [name] ([id number]) -------------------------------------------------------------------------------- SuperBill Details Patient Name: Jacob [name], Raheim A. Date of Service: 04/04/2015 Medical Record Number: [id number] Patient Account Number: [id number] Date of Birth/Sex: May 08, 1970 (44 y.o. Male) Treating RN: Ahmed Prima Primary Care Physician: Gayland Curry Other Clinician: Referring Physician: Gayland Curry Treating Physician/Extender: Frann Rider in Treatment: 3 Diagnosis Coding ICD-10 Codes Code Description [phone] Type 2 diabetes mellitus with other skin ulcer Disruption of external operation (surgical) wound, not elsewhere classified, initial T81.31XA encounter L97.222 Non-pressure chronic ulcer of left calf with fat layer exposed F17.228 Nicotine dependence, chewing tobacco, with other nicotine-induced disorders E66.01 Morbid (severe) obesity due to excess calories Facility Procedures CPT4: Description  Modifier Quantity  Code [id number] [phone] - DEBRIDE WOUND 1ST 20 SQ CM OR < 1 ICD-10 Description Diagnosis E11.622 Type 2 diabetes mellitus with other skin ulcer T81.31XA Disruption of external operation (surgical) wound, not elsewhere  classified, initial encounter L97.222 Non-pressure chronic ulcer of left calf with fat layer exposed F17.228 Nicotine dependence, chewing tobacco, with other nicotine-induced disorders Physician Procedures CPT4: Description Modifier Quantity Code [id number] - WC PHYS DEBR WO ANESTH 20 SQ CM 1 ICD-10 Description Diagnosis E11.622 Type 2 diabetes mellitus with other skin ulcer T81.31XA Disruption of external operation (surgical) wound, not elsewhere  classified, initial encounter L97.222 Non-pressure chronic ulcer of left calf with fat layer exposed F17.228 Nicotine dependence, chewing tobacco, with other nicotine-induced disorders VEDAANT, JAGODA ([id number]) Electronic Signature(s) Signed: 04/04/2015 2:19:48 PM By: Christin Fudge MD, FACS Entered By: Christin Fudge on 04/04/2015 14:19:48

## 2015-04-05 NOTE — Progress Notes (Signed)
GEOGGREY, GRESH ([id number]) Visit Report for 04/04/2015 Arrival Information Details Patient Name: Jacob [name], [name] Date of Service: 04/04/2015 2:00 PM Medical Record Number: [id number] Patient Account Number: [id number] Date of Birth/Sex: 1971/03/10 (44 y.o. Male) Treating RN: Ahmed Prima Primary Care Physician: Gayland Curry Other Clinician: Referring Physician: Gayland Curry Treating Physician/Extender: Frann Rider in Treatment: 3 Visit Information History Since Last Visit All ordered tests and consults were completed: No Patient Arrived: Ambulatory Added or deleted any medications: No Arrival Time: 13:58 Any new allergies or adverse reactions: No Accompanied By: self Had a fall or experienced change in No Transfer Assistance: None activities of daily living that may affect Patient Identification Verified: Yes risk of falls: Secondary Verification Process Yes Signs or symptoms of abuse/neglect since last No Completed: visito Patient Requires Transmission-Based No Hospitalized since last visit: No Precautions: Pain Present Now: No Patient Has Alerts: Yes Patient Alerts: DMII Electronic Signature(s) Signed: 04/04/2015 6:00:56 PM By: Alric Quan Entered By: Alric Quan on 04/04/2015 13:58:28 Pickrell, Maksim Loni Muse ([id number]) -------------------------------------------------------------------------------- Encounter Discharge Information Details Patient Name: Jacob [name], [name] Date of Service: 04/04/2015 2:00 PM Medical Record Number: [id number] Patient Account Number: [id number] Date of Birth/Sex: January 06, 1971 (44 y.o. Male) Treating RN: Ahmed Prima Primary Care Physician: Gayland Curry Other Clinician: Referring Physician: Gayland Curry Treating Physician/Extender: Frann Rider in Treatment: 3 Encounter Discharge Information Items Discharge Pain Level: 0 Discharge Condition: Stable Ambulatory Status:  Ambulatory Discharge Destination: Home Transportation: Private Auto Accompanied By: self Schedule Follow-up Appointment: Yes Medication Reconciliation completed and provided to Patient/Care Yes Raechel Marcos: Provided on Clinical Summary of Care: 04/04/2015 Form Type Recipient Paper Patient [facility] Electronic Signature(s) Signed: 04/04/2015 2:21:08 PM By: Ruthine Dose Entered By: Ruthine Dose on 04/04/2015 14:21:08 [name] ([id number]) -------------------------------------------------------------------------------- Lower Extremity Assessment Details Patient Name: Jacob [name], [name] Date of Service: 04/04/2015 2:00 PM Medical Record Number: [id number] Patient Account Number: [id number] Date of Birth/Sex: June 10, 1970 (44 y.o. Male) Treating RN: Ahmed Prima Primary Care Physician: Gayland Curry Other Clinician: Referring Physician: Gayland Curry Treating Physician/Extender: Frann Rider in Treatment: 3 Edema Assessment Assessed: [Left: No] [Right: No] Edema: [Left: N] [Right: o] Vascular Assessment Pulses: Posterior Tibial Palpable: [Left:Yes] Dorsalis Pedis Palpable: [Left:Yes] Extremity colors, hair growth, and conditions: Extremity Color: [Left:Normal] Hair Growth on Extremity: [Left:Yes] Temperature of Extremity: [Left:Warm] Capillary Refill: [Left:< 3 seconds] Electronic Signature(s) Signed: 04/04/2015 6:00:56 PM By: Alric Quan Entered By: Alric Quan on 04/04/2015 14:07:16 [place], [place]. ([id number]) -------------------------------------------------------------------------------- Multi Wound Chart Details Patient Name: Jacob [name], [name] Date of Service: 04/04/2015 2:00 PM Medical Record Number: [id number] Patient Account Number: [id number] Date of Birth/Sex: November 07, 1970 (44 y.o. Male) Treating RN: Ahmed Prima Primary Care Physician: Gayland Curry Other Clinician: Referring Physician: Gayland Curry Treating  Physician/Extender: Frann Rider in Treatment: 3 Vital Signs Height(in): 72 Pulse(bpm): 61 Weight(lbs): 360 Blood Pressure 166/87 (mmHg): Body Mass Index(BMI): 49 Temperature(F): 97.9 Respiratory Rate 20 (breaths/min): Photos: [1:No Photos] [N/A:N/A] Wound Location: [1:Left Lower Leg - Lateral] [N/A:N/A] Wounding Event: [1:Surgical Injury] [N/A:N/A] Primary Etiology: [1:Open Surgical Wound] [N/A:N/A] Comorbid History: [1:Sleep Apnea, Hypertension, Type II Diabetes, Gout] [N/A:N/A] Date Acquired: [1:01/30/2015] [N/A:N/A] Weeks of Treatment: [1:3] [N/A:N/A] Wound Status: [1:Open] [N/A:N/A] Measurements L x W x D 1.5x1.5x0.1 [N/A:N/A] (cm) Area (cm) : [1:1.767] [N/A:N/A] Volume (cm) : [1:0.177] [N/A:N/A] % Reduction in Area: [1:37.50%] [N/A:N/A] % Reduction in Volume: 37.50% [N/A:N/A] Classification: [1:Full Thickness Without Exposed Support Structures] [N/A:N/A] HBO Classification: [1:Grade 1] [N/A:N/A] Exudate Amount: [1:Small] [N/A:N/A] Exudate Type: [1:Serous] [N/A:N/A] Exudate Color: [1:amber] [N/A:N/A] Wound  Margin: [1:Flat and Intact] [N/A:N/A] Granulation Amount: [1:Large (67-100%)] [N/A:N/A] Granulation Quality: [1:Red] [N/A:N/A] Necrotic Amount: [1:Small (1-33%)] [N/A:N/A] Exposed Structures: [1:Fascia: No Fat: No Tendon: No] [N/A:N/A] Muscle: No Joint: No Bone: No Limited to Skin Breakdown Epithelialization: None N/A N/A Periwound Skin Texture: Edema: No N/A N/A Excoriation: No Induration: No Callus: No Crepitus: No Fluctuance: No Friable: No Rash: No Scarring: No Periwound Skin Moist: Yes N/A N/A Moisture: Maceration: No Dry/Scaly: No Periwound Skin Color: Erythema: Yes N/A N/A Atrophie Blanche: No Cyanosis: No Ecchymosis: No Hemosiderin Staining: No Mottled: No Pallor: No Rubor: No Erythema Location: Circumferential N/A N/A Temperature: No Abnormality N/A N/A Tenderness on Yes N/A N/A Palpation: Wound Preparation: Ulcer  Cleansing: N/A N/A Rinsed/Irrigated with Saline Topical Anesthetic Applied: Other: lidocaine 4% Treatment Notes Electronic Signature(s) Signed: 04/04/2015 6:00:56 PM By: Alric Quan Entered By: Alric Quan on 04/04/2015 14:07:34 [name] ([id number]) -------------------------------------------------------------------------------- [place] Details Patient Name: Jacob [name], Gadge A. Date of Service: 04/04/2015 2:00 PM Medical Record Number: [id number] Patient Account Number: [id number] Date of Birth/Sex: [birth year] (44 y.o. Male) Treating RN: Ahmed Prima Primary Care Physician: Gayland Curry Other Clinician: Referring Physician: Gayland Curry Treating Physician/Extender: Frann Rider in Treatment: 3 Active Inactive Orientation to the Wound Care Program Nursing Diagnoses: Knowledge deficit related to the wound healing center program Goals: Patient/caregiver will verbalize understanding of the [facility] Date Initiated: 03/14/2015 Goal Status: Active Interventions: Provide education on orientation to the wound center Notes: Wound/Skin Impairment Nursing Diagnoses: Impaired tissue integrity Goals: Patient/caregiver will verbalize understanding of skin care regimen Date Initiated: 03/14/2015 Goal Status: Active Ulcer/skin breakdown will have a volume reduction of 30% by week 4 Date Initiated: 03/14/2015 Goal Status: Active Ulcer/skin breakdown will have a volume reduction of 50% by week 8 Date Initiated: 03/14/2015 Goal Status: Active Ulcer/skin breakdown will have a volume reduction of 80% by week 12 Date Initiated: 03/14/2015 Goal Status: Active Ulcer/skin breakdown will heal within 14 weeks Date Initiated: 03/14/2015 Goal Status: Active CAYLOR, DEBLASIO ([id number]) Interventions: Assess patient/caregiver ability to perform ulcer/skin care regimen upon admission and as needed Assess  ulceration(s) every visit Notes: Electronic Signature(s) Signed: 04/04/2015 6:00:56 PM By: Alric Quan Entered By: Alric Quan on 04/04/2015 14:07:26 Friedly, Caydyn Loni Muse ([id number]) -------------------------------------------------------------------------------- Pain Assessment Details Patient Name: Jacob [name], Tyhir A. Date of Service: 04/04/2015 2:00 PM Medical Record Number: [id number] Patient Account Number: [id number] Date of Birth/Sex: June 03, 1970 (44 y.o. Male) Treating RN: Ahmed Prima Primary Care Physician: Gayland Curry Other Clinician: Referring Physician: Gayland Curry Treating Physician/Extender: Frann Rider in Treatment: 3 Active Problems Location of Pain Severity and Description of Pain Patient Has Paino No Site Locations With Dressing Change: No Pain Management and Medication Current Pain Management: Electronic Signature(s) Signed: 04/04/2015 6:00:56 PM By: Alric Quan Entered By: Alric Quan on 04/04/2015 13:59:18 [name] ([id number]) -------------------------------------------------------------------------------- Patient/Caregiver Education Details Patient Name: Jacob [name], Uvaldo A. Date of Service: 04/04/2015 2:00 PM Medical Record Number: [id number] Patient Account Number: [id number] Date of Birth/Gender: [birth year] (44 y.o. Male) Treating RN: Ahmed Prima Primary Care Physician: Gayland Curry Other Clinician: Referring Physician: Gayland Curry Treating Physician/Extender: Frann Rider in Treatment: 3 Education Assessment Education Provided To: Patient Education Topics Provided Wound/Skin Impairment: Handouts: Other: continue to change dressing as ordered Methods: Demonstration, Explain/Verbal Responses: State content correctly Electronic Signature(s) Signed: 04/04/2015 6:00:56 PM By: Alric Quan Entered By: Alric Quan on 04/04/2015 14:21:12 [name]  ([id number]) -------------------------------------------------------------------------------- Wound Assessment Details Patient Name: Jacob [name], Arlene A. Date of Service: 04/04/2015 2:00  PM Medical Record Number: [id number] Patient Account Number: [id number] Date of Birth/Sex: [birth year] (44 y.o. Male) Treating RN: Carolyne Fiscal, Debi Primary Care Physician: Gayland Curry Other Clinician: Referring Physician: Gayland Curry Treating Physician/Extender: Frann Rider in Treatment: 3 Wound Status Wound Number: 1 Primary Open Surgical Wound Etiology: Wound Location: Left Lower Leg - Lateral Wound Status: Open Wounding Event: Surgical Injury Comorbid Sleep Apnea, Hypertension, Type II Date Acquired: 01/30/2015 History: Diabetes, Gout Weeks Of Treatment: 3 Clustered Wound: No Photos Photo Uploaded By: Alric Quan on 04/04/2015 17:46:02 Wound Measurements Length: (cm) 1.5 % Reduction i Width: (cm) 1.5 % Reduction i Depth: (cm) 0.1 Epithelializa Area: (cm) 1.767 Tunneling: Volume: (cm) 0.177 Undermining: n Area: 37.5% n Volume: 37.5% tion: None No No Wound Description Full Thickness Without Classification: Exposed Support Structures Diabetic Severity Grade 1 (Wagner): Wound Margin: Flat and Intact Exudate Amount: Small Exudate Type: Serous Exudate Color: amber Foul Odor After Cleansing: No Wound Bed Granulation Amount: Large (67-100%) Exposed Structure Granulation Quality: Red Fascia Exposed: No Wadas, Dhyan A. ([id number]) Necrotic Amount: Small (1-33%) Fat Layer Exposed: No Necrotic Quality: Adherent Slough Tendon Exposed: No Muscle Exposed: No Joint Exposed: No Bone Exposed: No Limited to Skin Breakdown Periwound Skin Texture Texture Color No Abnormalities Noted: No No Abnormalities Noted: No Callus: No Atrophie Blanche: No Crepitus: No Cyanosis: No Excoriation: No Ecchymosis: No Fluctuance: No Erythema: Yes Friable: No Erythema  Location: Circumferential Induration: No Hemosiderin Staining: No Localized Edema: No Mottled: No Rash: No Pallor: No Scarring: No Rubor: No Moisture Temperature / Pain No Abnormalities Noted: No Temperature: No Abnormality Dry / Scaly: No Tenderness on Palpation: Yes Maceration: No Moist: Yes Wound Preparation Ulcer Cleansing: Rinsed/Irrigated with Saline Topical Anesthetic Applied: Other: lidocaine 4%, Treatment Notes Wound #1 (Left, Lateral Lower Leg) 1. Cleansed with: Clean wound with Normal Saline 2. Anesthetic Topical Lidocaine 4% cream to wound bed prior to debridement 4. Dressing Applied: Other dressing (specify in notes) 5. Secondary Dressing Applied Bordered Foam Dressing Notes sorbact Electronic Signature(s) Signed: 04/04/2015 6:00:56 PM By: Alric Quan Entered By: Alric Quan on 04/04/2015 14:03:35 [name] ([id number]) Jacob [name], Shloma AMarland Kitchen ([id number]) -------------------------------------------------------------------------------- Vitals Details Patient Name: Jacob [name], Gionni A. Date of Service: 04/04/2015 2:00 PM Medical Record Number: [id number] Patient Account Number: [id number] Date of Birth/Sex: Oct 20, 1970 (44 y.o. Male) Treating RN: Ahmed Prima Primary Care Physician: Gayland Curry Other Clinician: Referring Physician: Gayland Curry Treating Physician/Extender: Frann Rider in Treatment: 3 Vital Signs Time Taken: 14:02 Temperature (F): 97.9 Height (in): 72 Pulse (bpm): 61 Weight (lbs): 360 Respiratory Rate (breaths/min): 20 Body Mass Index (BMI): 48.8 Blood Pressure (mmHg): 166/87 Reference Range: 80 - 120 mg / dl Electronic Signature(s) Signed: 04/04/2015 6:00:56 PM By: Alric Quan Entered By: Alric Quan on 04/04/2015 14:07:11

## 2015-04-11 ENCOUNTER — Encounter: Payer: BC Managed Care – PPO | Admitting: Surgery

## 2015-04-11 DIAGNOSIS — E11622 Type 2 diabetes mellitus with other skin ulcer: Secondary | ICD-10-CM | POA: Diagnosis not present

## 2015-04-12 NOTE — Progress Notes (Signed)
Jacob [name] ([id number]) Visit Report for 04/11/2015 Chief Complaint Document Details Patient Name: [name], [name] Date of Service: 04/11/2015 1:45 PM Medical Record Number: [id number] Patient Account Number: [id number] Date of Birth/Sex: December 11, 1970 (44 y.o. Male) Treating RN: Jacob [name] Primary Care Physician: Jacob [name] Other Clinician: Referring Physician: Gayland [name] Treating Physician/Extender: Jacob [name] in Treatment: 4 Information Obtained from: Patient Chief Complaint Patient presents to the wound care center for a consult due non healing wound of the left lower extremity which he has had for about a month and a half. Electronic Signature(s) Signed: 04/11/2015 1:59:53 PM By: Jacob Fudge MD, FACS Entered By: Jacob [name] on 04/11/2015 13:59:52 [name] ([id number]) -------------------------------------------------------------------------------- HPI Details Patient Name: Jacob [name], [name] Date of Service: 04/11/2015 1:45 PM Medical Record Number: [id number] Patient Account Number: [id number] Date of Birth/Sex: [birth year] (44 y.o. Male) Treating RN: Jacob [name] Primary Care Physician: Jacob [name] Other Clinician: Referring Physician: Gayland [name] Treating Physician/Extender: Jacob [name] in Treatment: 4 History of Present Illness Location: left lower extremity laterally Quality: Patient reports experiencing a dull pain to affected area(s). Severity: Patient states wound (s) are getting better. Duration: Patient has had the wound for < 6 weeks prior to presenting for treatment Timing: Pain in wound is Intermittent (comes and goes Context: The wound occurred when the patient had a shave biopsy of a lesion on that leg which was benign Modifying Factors: Other treatment(s) tried include:was put on Keflex and Bactrim by his PCP Associated Signs and Symptoms: Patient reports presence of swelling HPI Description:  Pleasant 44 year old gentleman who had a shave biopsy done from his left lower extremity on October 3 and the wound has not healed since then. This was a benign disease and he was concerned about the length of time it has taken to heal. He has past medical history of diabetes mellitus, gout, hypertension, sleep apnea and is status post tonsillectomy and adenoidectomy, cholecystectomy and uvulopalatopharyngoplasty. he was recently seen by his PCP Dr. Jodi [name] who had already given him Keflex for a week and then put him on Bactrim for 10 days. the patient was a smoker before and quit in 1995 but currently chews tobacco on a daily basis. Electronic Signature(s) Signed: 04/11/2015 1:59:59 PM By: Jacob Fudge MD, FACS Entered By: Jacob [name] on 04/11/2015 13:59:58 [name] ([id number]) -------------------------------------------------------------------------------- Physical Exam Details Patient Name: Jacob [name], [name] Date of Service: 04/11/2015 1:45 PM Medical Record Number: [id number] Patient Account Number: [id number] Date of Birth/Sex: 01-21-1971 (44 y.o. Male) Treating RN: Jacob [name] Primary Care Physician: Jacob [name] Other Clinician: Referring Physician: Gayland [name] Treating Physician/Extender: Jacob [name] in Treatment: 4 Constitutional . Pulse regular. Respirations normal and unlabored. Afebrile. . Eyes Nonicteric. Reactive to light. Ears, Nose, Mouth, and Throat Lips, teeth, and gums WNL.Marland Kitchen Moist mucosa without lesions . Neck supple and nontender. No palpable supraclavicular or cervical adenopathy. Normal sized without goiter. Respiratory WNL. No retractions.. Cardiovascular Pedal Pulses WNL. No clubbing, cyanosis or edema. Lymphatic No adneopathy. No adenopathy. No adenopathy. Musculoskeletal Adexa without tenderness or enlargement.. Digits and nails w/o clubbing, cyanosis, infection, petechiae, ischemia, or inflammatory  conditions.. Integumentary (Hair, Skin) No suspicious lesions. No crepitus or fluctuance. No peri-wound warmth or erythema. No masses.Marland Kitchen Psychiatric Judgement and insight Intact.. No evidence of depression, anxiety, or agitation.. Notes the patient has slight redness around his wound but this is not cellulitis or infection. The wound has some hyper granulation tissue and this is going to be treated with Hydrofera Blue.  Electronic Signature(s) Signed: 04/11/2015 2:01:01 PM By: Jacob Fudge MD, FACS Entered By: Jacob [name] on 04/11/2015 14:01:01 [name] ([id number]) -------------------------------------------------------------------------------- Physician Orders Details Patient Name: Jacob [name], [name] Date of Service: 04/11/2015 1:45 PM Medical Record Number: [id number] Patient Account Number: [id number] Date of Birth/Sex: 15-Nov-1970 (44 y.o. Male) Treating RN: Jacob [name] Primary Care Physician: Jacob [name] Other Clinician: Referring Physician: Gayland [name] Treating Physician/Extender: Jacob [name] in Treatment: 4 Verbal / Phone Orders: Yes Clinician: Pinkerton, [name] Read Back and Verified: Yes Diagnosis Coding Wound Cleansing Wound #1 Left,Lateral Lower Leg o Clean wound with Normal Saline. Primary Wound Dressing Wound #1 Left,Lateral Lower Leg o Hydrafera Blue Secondary Dressing Wound #1 Left,Lateral Lower Leg o Boardered Foam Dressing Dressing Change Frequency Wound #1 Left,Lateral Lower Leg o Change dressing every other day. Follow-up Appointments Wound #1 Left,Lateral Lower Leg o Return Appointment in 1 week. Electronic Signature(s) Signed: 04/11/2015 2:14:04 PM By: Jacob Fudge MD, FACS Signed: 04/11/2015 3:59:50 PM By: Jacob [name] Entered By: Jacob [name] on 04/11/2015 13:58:57 Jacob [name] ([id number]) -------------------------------------------------------------------------------- Problem List  Details Patient Name: Jacob [name], [name] Date of Service: 04/11/2015 1:45 PM Medical Record Number: [id number] Patient Account Number: [id number] Date of Birth/Sex: [birth year] (44 y.o. 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 Date of Service: 04/11/2015 1:45 PM Medical Record Number: [id number] Patient Account Number: [id number] Date of Birth/Sex: [birth year] (44 y.o. Male) Treating RN: Jacob [name] Primary Care Physician: Jacob [name] Other Clinician: Referring Physician: Gayland [name] Treating Physician/Extender: Jacob [name] in Treatment: 4 Subjective Chief Complaint Information obtained from Patient Patient presents to the wound care center for a consult due non healing wound of the left lower extremity which he has had for about a month and a half. History of Present Illness (HPI) The following HPI elements were documented for the patient's wound: Location: left lower  extremity laterally Quality: Patient reports experiencing a dull pain to affected area(s). Severity: Patient states wound (s) are getting better. Duration: Patient has had the wound for < 6 weeks prior to presenting for treatment Timing: Pain in wound is Intermittent (comes and goes Context: The wound occurred when the patient had a shave biopsy of a lesion on that leg which was benign Modifying Factors: Other treatment(s) tried include:was put on Keflex and Bactrim by his PCP Associated Signs and Symptoms: Patient reports presence of swelling Pleasant 44 year old gentleman who had a shave biopsy done from his left lower extremity on October 3 and the wound has not healed since then. This was a benign disease and he was concerned about the length of time it has taken to heal. He has past medical history of diabetes mellitus, gout, hypertension, sleep apnea and is status post tonsillectomy and adenoidectomy, cholecystectomy and uvulopalatopharyngoplasty. he was recently seen by his PCP Dr. Mickel Baas  Valere Dross who had already given him Keflex for a week and then put him on Bactrim for 10 days. the patient was a smoker before and quit in 1995 but currently chews tobacco on a daily basis. Objective Constitutional Pulse regular. Respirations normal and unlabored. Afebrile. Vitals Time Taken: 1:44 PM, Height: 72 in, Weight: 360 lbs, BMI: 48.8, Temperature: 97.8 F, Pulse: 66 bpm, Respiratory Rate: 20 breaths/min, Blood Pressure: 166/84 mmHg. Jacob [name], Jacob [name] Kitchen ([id number]) Eyes Nonicteric. Reactive to light. Ears, Nose, Mouth, and Throat Lips, teeth, and gums WNL.Marland Kitchen Moist mucosa without lesions . Neck supple and nontender. No palpable supraclavicular or cervical adenopathy. Normal sized without goiter. Respiratory WNL. No retractions.. Cardiovascular Pedal Pulses WNL. No clubbing, cyanosis or edema. Lymphatic No adneopathy. No adenopathy. No adenopathy. Musculoskeletal Adexa without tenderness  or enlargement.. Digits and nails w/o clubbing, cyanosis, infection, petechiae, ischemia, or inflammatory conditions.Marland Kitchen Psychiatric Judgement and insight Intact.. No evidence of depression, anxiety, or agitation.. General Notes: the patient has slight redness around his wound but this is not cellulitis or infection. The wound has some hyper granulation tissue and this is going to be treated with Hydrofera Blue. Integumentary (Hair, Skin) No suspicious lesions. No crepitus or fluctuance. No peri-wound warmth or erythema. No masses.. Wound #1 status is Open. Original cause of wound was Surgical Injury. The wound is located on the Left,Lateral Lower Leg. The wound measures 1.3cm length x 1.5cm width x 0.1cm depth; 1.532cm^2 area and 0.153cm^3 volume. The wound is limited to skin breakdown. There is no tunneling or undermining noted. There is a medium amount of serosanguineous drainage noted. The wound margin is flat and intact. There is large (67-100%) red granulation within the wound bed. There is a small (1-33%) amount of necrotic tissue within the wound bed including Adherent Slough. The periwound skin appearance exhibited: Localized Edema, Moist, Erythema. The periwound skin appearance did not exhibit: Callus, Crepitus, Excoriation, Fluctuance, Friable, Induration, Rash, Scarring, Dry/Scaly, Maceration, Atrophie Blanche, Cyanosis, Ecchymosis, Hemosiderin Staining, Mottled, Pallor, Rubor. The surrounding wound skin color is noted with erythema which is circumferential. Periwound temperature was noted as No Abnormality. The periwound has tenderness on palpation. 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 Primary Wound Dressing: Wound #1 Left,Lateral Lower Leg: Hydrafera Blue Secondary Dressing: Wound #1 Left,Lateral Lower Leg: Boardered Foam Dressing Dressing Change Frequency: Wound #1 Left,Lateral Lower Leg: Change dressing every other day. Follow-up Appointments: Wound #1 Left,Lateral Lower Leg: Return Appointment in 1 week. The wound looks healthy and has minimal debris and hypergranulation tissue and has been treated appropriately. I have recommended a piece of Hydrofera Blue to be applied 2 or 3 times a week. He will come back and see as next week. Electronic Signature(s) Signed: 04/11/2015 2:01:50 PM By: Jacob Fudge MD, FACS Jacob [name], Jacob [name] Kitchen ([id number]) Entered By: Jacob [name] on 04/11/2015 14:01:50 [name] ([id number]) -------------------------------------------------------------------------------- SuperBill Details Patient Name: Jacob [name], Danel A. Date of Service: 04/11/2015 Medical Record Number: [id number] Patient Account Number: [id number] Date of Birth/Sex: 10/30/1970 (44 y.o. Male) Treating RN: Jacob [name] Primary Care Physician: Jacob [name] Other Clinician: Referring Physician: Gayland [name] Treating Physician/Extender: Con Memos,  Akash Winski Weeks in Treatment: 4 Diagnosis Coding ICD-10 Codes Code Description E11.622 Type 2 diabetes mellitus with other skin ulcer Disruption of external operation (surgical) wound, not elsewhere classified, initial T81.31XA encounter L97.222 Non-pressure chronic ulcer of left calf with fat layer exposed F17.228 Nicotine dependence, chewing tobacco, with other nicotine-induced disorders E66.01 Morbid (severe) obesity due to excess calories Facility Procedures CPT4 Code: [id number] Description: [id number] - WOUND CARE  VISIT-LEV 2 EST PT Modifier: Quantity: 1 Physician Procedures CPT4: Description Modifier Quantity Code [id number] - WC PHYS LEVEL 3 - EST PT 1 ICD-10 Description Diagnosis E11.622 Type 2 diabetes mellitus with other skin ulcer T81.31XA Disruption of external operation (surgical) wound, not elsewhere classified,  initial encounter L97.222 Non-pressure chronic ulcer of left calf with fat layer exposed F17.228 Nicotine dependence, chewing tobacco, with other nicotine-induced disorders Electronic Signature(s) Signed: 04/11/2015 2:02:04 PM By: Jacob Fudge MD, FACS Entered By: Jacob [name] on 04/11/2015 14:02:04

## 2015-04-12 NOTE — Progress Notes (Signed)
Jacob [name], Jacob [name] ([id number]) Visit Report for 04/11/2015 Arrival Information Details Patient Name: Jacob [name], [name] Date of Service: 04/11/2015 1:45 PM Medical Record Number: [id number] Patient Account Number: [id number] Date of Birth/Sex: February 23, 1971 (44 y.o. Male) Treating RN: Ahmed Prima Primary Care Physician: Gayland Curry Other Clinician: Referring Physician: Gayland Curry Treating Physician/Extender: Frann Rider in Treatment: 4 Visit Information History Since Last Visit All ordered tests and consults were completed: No Patient Arrived: Ambulatory Added or deleted any medications: No Arrival Time: 13:42 Any new allergies or adverse reactions: No Accompanied By: self Had a fall or experienced change in No Transfer Assistance: Manual activities of daily living that may affect Patient Identification Verified: Yes risk of falls: Secondary Verification Process Yes Signs or symptoms of abuse/neglect since last No Completed: visito Patient Requires Transmission-Based No Hospitalized since last visit: No Precautions: Pain Present Now: No Patient Has Alerts: Yes Patient Alerts: DMII Electronic Signature(s) Signed: 04/11/2015 3:59:50 PM By: Alric Quan Entered By: Alric Quan on 04/11/2015 13:44:13 Jacob [name], Jacob [name] ([id number]) -------------------------------------------------------------------------------- Clinic Level of Care Assessment Details Patient Name: Jacob [name], [name] Date of Service: 04/11/2015 1:45 PM Medical Record Number: [id number] Patient Account Number: [id number] Date of Birth/Sex: 02-Aug-1970 (44 y.o. Male) Treating RN: Carolyne Fiscal, Debi Primary Care Physician: Gayland Curry Other Clinician: Referring Physician: Gayland Curry Treating Physician/Extender: Frann Rider in Treatment: 4 Clinic Level of Care Assessment Items TOOL 4 Quantity Score []  - Use when only an EandM is performed on FOLLOW-UP visit  0 ASSESSMENTS - Nursing Assessment / Reassessment []  - Reassessment of Co-morbidities (includes updates in patient status) 0 X - Reassessment of Adherence to Treatment Plan 1 5 ASSESSMENTS - Wound and Skin Assessment / Reassessment X - Simple Wound Assessment / Reassessment - one wound 1 5 []  - Complex Wound Assessment / Reassessment - multiple wounds 0 []  - Dermatologic / Skin Assessment (not related to wound area) 0 ASSESSMENTS - Focused Assessment []  - Circumferential Edema Measurements - multi extremities 0 []  - Nutritional Assessment / Counseling / Intervention 0 []  - Lower Extremity Assessment (monofilament, tuning fork, pulses) 0 []  - Peripheral Arterial Disease Assessment (using hand held doppler) 0 ASSESSMENTS - Ostomy and/or Continence Assessment and Care []  - Incontinence Assessment and Management 0 []  - Ostomy Care Assessment and Management (repouching, etc.) 0 PROCESS - Coordination of Care X - Simple Patient / Family Education for ongoing care 1 15 []  - Complex (extensive) Patient / Family Education for ongoing care 0 []  - Staff obtains Programmer, systems, Records, Test Results / Process Orders 0 []  - Staff telephones HHA, Nursing Homes / Clarify orders / etc 0 []  - Routine Transfer to another Facility (non-emergent condition) 0 Desire, Obert A. ([id number]) []  - Routine Hospital Admission (non-emergent condition) 0 []  - New Admissions / Biomedical engineer / Ordering NPWT, Apligraf, etc. 0 []  - Emergency Hospital Admission (emergent condition) 0 X - Simple Discharge Coordination 1 10 []  - Complex (extensive) Discharge Coordination 0 PROCESS - Special Needs []  - Pediatric / Minor Patient Management 0 []  - Isolation Patient Management 0 []  - Hearing / Language / Visual special needs 0 []  - Assessment of Community assistance (transportation, D/C planning, etc.) 0 []  - Additional assistance / Altered mentation 0 []  - Support Surface(s) Assessment (bed, cushion, seat, etc.)  0 INTERVENTIONS - Wound Cleansing / Measurement X - Simple Wound Cleansing - one wound 1 5 []  - Complex Wound Cleansing - multiple wounds 0 X - Wound Imaging (photographs - any number of wounds) 1 5 []  -  Wound Tracing (instead of photographs) 0 X - Simple Wound Measurement - one wound 1 5 []  - Complex Wound Measurement - multiple wounds 0 INTERVENTIONS - Wound Dressings X - Small Wound Dressing one or multiple wounds 1 10 []  - Medium Wound Dressing one or multiple wounds 0 []  - Large Wound Dressing one or multiple wounds 0 []  - Application of Medications - topical 0 []  - Application of Medications - injection 0 INTERVENTIONS - Miscellaneous []  - External ear exam 0 Jacob [name], [name] ([id number]) []  - Specimen Collection (cultures, biopsies, blood, body fluids, etc.) 0 []  - Specimen(s) / Culture(s) sent or taken to Lab for analysis 0 []  - Patient Transfer (multiple staff / Harrel Lemon Lift / Similar devices) 0 []  - Simple Staple / Suture removal (25 or less) 0 []  - Complex Staple / Suture removal (26 or more) 0 []  - Hypo / Hyperglycemic Management (close monitor of Blood Glucose) 0 []  - Ankle / Brachial Index (ABI) - do not check if billed separately 0 X - Vital Signs 1 5 Has the patient been seen at the hospital within the last three years: Yes Total Score: 65 Level Of Care: New/Established - Level 2 Electronic Signature(s) Signed: 04/11/2015 3:59:50 PM By: Alric Quan Entered By: Alric Quan on 04/11/2015 13:59:32 Jacob [name], Jacob [name] ([id number]) -------------------------------------------------------------------------------- Encounter Discharge Information Details Patient Name: Jacob [name], [name] Date of Service: 04/11/2015 1:45 PM Medical Record Number: [id number] Patient Account Number: [id number] Date of Birth/Sex: [birth year] (44 y.o. Male) Treating RN: Ahmed Prima Primary Care Physician: Gayland Curry Other Clinician: Referring Physician: Gayland Curry Treating Physician/Extender: Frann Rider in Treatment: 4 Encounter Discharge Information Items Discharge Pain Level: 0 Discharge Condition: Stable Ambulatory Status: Ambulatory Discharge Destination: Home Transportation: Private Auto Accompanied By: self Schedule Follow-up Appointment: Yes Medication Reconciliation completed and provided to Patient/Care Yes Quintella Mura: Provided on Clinical Summary of Care: 04/11/2015 Form Type Recipient Paper Patient [facility] Electronic Signature(s) Signed: 04/11/2015 2:15:37 PM By: Ruthine Dose Entered By: Ruthine Dose on 04/11/2015 14:15:37 Jacob [name], Jacob [name] ([id number]) -------------------------------------------------------------------------------- Lower Extremity Assessment Details Patient Name: Jacob [name], [name] Date of Service: 04/11/2015 1:45 PM Medical Record Number: [id number] Patient Account Number: [id number] Date of Birth/Sex: Nov 26, 1970 (44 y.o. Male) Treating RN: Ahmed Prima Primary Care Physician: Gayland Curry Other Clinician: Referring Physician: Gayland Curry Treating Physician/Extender: Frann Rider in Treatment: 4 Edema Assessment Assessed: [Left: No] [Right: No] Edema: [Left: N] [Right: o] Vascular Assessment Pulses: Posterior Tibial Dorsalis Pedis Palpable: [Left:Yes] Extremity colors, hair growth, and conditions: Extremity Color: [Left:Normal] Hair Growth on Extremity: [Left:Yes] Temperature of Extremity: [Left:Warm] Capillary Refill: [Left:< 3 seconds] Toe Nail Assessment Left: Right: Thick: No Discolored: No Deformed: No Improper Length and Hygiene: No Electronic Signature(s) Signed: 04/11/2015 3:59:50 PM By: Alric Quan Entered By: Alric Quan on 04/11/2015 13:52:50 Charon, Cire AMarland Kitchen ([id number]) -------------------------------------------------------------------------------- Multi Wound Chart Details Patient Name: Jacob [name], Kamilo A. Date of Service:  04/11/2015 1:45 PM Medical Record Number: [id number] Patient Account Number: [id number] Date of Birth/Sex: 1970-10-28 (44 y.o. Male) Treating RN: Ahmed Prima Primary Care Physician: Gayland Curry Other Clinician: Referring Physician: Gayland Curry Treating Physician/Extender: Frann Rider in Treatment: 4 Vital Signs Height(in): 72 Pulse(bpm): 66 Weight(lbs): 360 Blood Pressure 166/84 (mmHg): Body Mass Index(BMI): 49 Temperature(F): 97.8 Respiratory Rate 20 (breaths/min): Photos: [1:No Photos] [N/A:N/A] Wound Location: [1:Left Lower Leg - Lateral] [N/A:N/A] Wounding Event: [1:Surgical Injury] [N/A:N/A] Primary Etiology: [1:Open Surgical Wound] [N/A:N/A] Comorbid History: [1:Sleep Apnea, Hypertension, Type II Diabetes, Gout] [N/A:N/A] Date Acquired: [1:01/30/2015] [N/A:N/A] Weeks of Treatment: [  1:4] [N/A:N/A] Wound Status: [1:Open] [N/A:N/A] Measurements L x W x D 1.3x1.5x0.1 [N/A:N/A] (cm) Area (cm) : [1:1.532] [N/A:N/A] Volume (cm) : [1:0.153] [N/A:N/A] % Reduction in Area: [1:45.80%] [N/A:N/A] % Reduction in Volume: 45.90% [N/A:N/A] Classification: [1:Full Thickness Without Exposed Support Structures] [N/A:N/A] HBO Classification: [1:Grade 1] [N/A:N/A] Exudate Amount: [1:Medium] [N/A:N/A] Exudate Type: [1:Serosanguineous] [N/A:N/A] Exudate Color: [1:red, brown] [N/A:N/A] Wound Margin: [1:Flat and Intact] [N/A:N/A] Granulation Amount: [1:Large (67-100%)] [N/A:N/A] Granulation Quality: [1:Red] [N/A:N/A] Necrotic Amount: [1:Small (1-33%)] [N/A:N/A] Exposed Structures: [1:Fascia: No Fat: No Tendon: No] [N/A:N/A] Muscle: No Joint: No Bone: No Limited to Skin Breakdown Epithelialization: None N/A N/A Periwound Skin Texture: Edema: Yes N/A N/A Excoriation: No Induration: No Callus: No Crepitus: No Fluctuance: No Friable: No Rash: No Scarring: No Periwound Skin Moist: Yes N/A N/A Moisture: Maceration: No Dry/Scaly: No Periwound Skin  Color: Erythema: Yes N/A N/A Atrophie Blanche: No Cyanosis: No Ecchymosis: No Hemosiderin Staining: No Mottled: No Pallor: No Rubor: No Erythema Location: Circumferential N/A N/A Temperature: No Abnormality N/A N/A Tenderness on Yes N/A N/A Palpation: Wound Preparation: Ulcer Cleansing: N/A N/A Rinsed/Irrigated with Saline Topical Anesthetic Applied: Other: lidocaine 4% Treatment Notes Electronic Signature(s) Signed: 04/11/2015 3:59:50 PM By: Alric Quan Entered By: Alric Quan on 04/11/2015 13:51:12 Jacob [name] ([id number]) -------------------------------------------------------------------------------- Jacob [name] Details Patient Name: Jacob [name], Said A. Date of Service: 04/11/2015 1:45 PM Medical Record Number: [id number] Patient Account Number: [id number] Date of Birth/Sex: [birth year] (44 y.o. Male) Treating RN: Ahmed Prima Primary Care Physician: Gayland Curry Other Clinician: Referring Physician: Gayland Curry Treating Physician/Extender: Frann Rider in Treatment: 4 Active Inactive Orientation to the Wound Care Program Nursing Diagnoses: Knowledge deficit related to the wound healing center program Goals: Patient/caregiver will verbalize understanding of the [facility] Date Initiated: 03/14/2015 Goal Status: Active Interventions: Provide education on orientation to the wound center Notes: Wound/Skin Impairment Nursing Diagnoses: Impaired tissue integrity Goals: Patient/caregiver will verbalize understanding of skin care regimen Date Initiated: 03/14/2015 Goal Status: Active Ulcer/skin breakdown will have a volume reduction of 30% by week 4 Date Initiated: 03/14/2015 Goal Status: Active Ulcer/skin breakdown will have a volume reduction of 50% by week 8 Date Initiated: 03/14/2015 Goal Status: Active Ulcer/skin breakdown will have a volume reduction of 80% by week 12 Date Initiated:  03/14/2015 Goal Status: Active Ulcer/skin breakdown will heal within 14 weeks Date Initiated: 03/14/2015 Goal Status: Active GURMAN, GOLON ([id number]) Interventions: Assess patient/caregiver ability to perform ulcer/skin care regimen upon admission and as needed Assess ulceration(s) every visit Notes: Electronic Signature(s) Signed: 04/11/2015 3:59:50 PM By: Alric Quan Entered By: Alric Quan on 04/11/2015 13:51:08 Slavens, Ayren Loni [name] ([id number]) -------------------------------------------------------------------------------- Pain Assessment Details Patient Name: Jacob [name], Aashish A. Date of Service: 04/11/2015 1:45 PM Medical Record Number: [id number] Patient Account Number: [id number] Date of Birth/Sex: 1971/03/16 (44 y.o. Male) Treating RN: Ahmed Prima Primary Care Physician: Gayland Curry Other Clinician: Referring Physician: Gayland Curry Treating Physician/Extender: Frann Rider in Treatment: 4 Active Problems Location of Pain Severity and Description of Pain Patient Has Paino No Site Locations Pain Management and Medication Current Pain Management: Electronic Signature(s) Signed: 04/11/2015 3:59:50 PM By: Alric Quan Entered By: Alric Quan on 04/11/2015 13:44:19 Jacob [name] ([id number]) -------------------------------------------------------------------------------- Patient/Caregiver Education Details Patient Name: Jacob [name], Cezar A. Date of Service: 04/11/2015 1:45 PM Medical Record Number: [id number] Patient Account Number: [id number] Date of Birth/Gender: 1970/09/15 (44 y.o. Male) Treating RN: Ahmed Prima Primary Care Physician: Gayland Curry Other Clinician: Referring Physician: Gayland Curry Treating Physician/Extender: Frann Rider in Treatment: 4 Education  Assessment Education Provided To: Patient Education Topics Provided Wound/Skin Impairment: Handouts: Other: change dressing as  directed Methods: Demonstration, Explain/Verbal Responses: State content correctly Electronic Signature(s) Signed: 04/11/2015 3:59:50 PM By: Alric Quan Entered By: Alric Quan on 04/11/2015 14:08:08 Jacob [name] ([id number]) -------------------------------------------------------------------------------- Wound Assessment Details Patient Name: Jacob [name], Wilborn A. Date of Service: 04/11/2015 1:45 PM Medical Record Number: [id number] Patient Account Number: [id number] Date of Birth/Sex: 1970/11/06 (44 y.o. Male) Treating RN: Ahmed Prima Primary Care Physician: Gayland Curry Other Clinician: Referring Physician: Gayland Curry Treating Physician/Extender: Frann Rider in Treatment: 4 Wound Status Wound Number: 1 Primary Open Surgical Wound Etiology: Wound Location: Left Lower Leg - Lateral Wound Status: Open Wounding Event: Surgical Injury Comorbid Sleep Apnea, Hypertension, Type II Date Acquired: 01/30/2015 History: Diabetes, Gout Weeks Of Treatment: 4 Clustered Wound: No Photos Photo Uploaded By: Alric Quan on 04/11/2015 14:37:42 Wound Measurements Length: (cm) 1.3 Width: (cm) 1.5 Depth: (cm) 0.1 Area: (cm) 1.532 Volume: (cm) 0.153 % Reduction in Area: 45.8% % Reduction in Volume: 45.9% Epithelialization: None Tunneling: No Undermining: No Wound Description Full Thickness Without Exposed Foul Odor Af Classification: Support Structures Diabetic Severity Grade 1 (Wagner): Wound Margin: Flat and Intact Exudate Amount: Medium Exudate Type: Serosanguineous Exudate Color: red, brown ter Cleansing: No Wound Bed Granulation Amount: Large (67-100%) Exposed Structure Granulation Quality: Red Fascia Exposed: No Tullos, Gerber A. ([id number]) Necrotic Amount: Small (1-33%) Fat Layer Exposed: No Necrotic Quality: Adherent Slough Tendon Exposed: No Muscle Exposed: No Joint Exposed: No Bone Exposed: No Limited to Skin  Breakdown Periwound Skin Texture Texture Color No Abnormalities Noted: No No Abnormalities Noted: No Callus: No Atrophie Blanche: No Crepitus: No Cyanosis: No Excoriation: No Ecchymosis: No Fluctuance: No Erythema: Yes Friable: No Erythema Location: Circumferential Induration: No Hemosiderin Staining: No Localized Edema: Yes Mottled: No Rash: No Pallor: No Scarring: No Rubor: No Moisture Temperature / Pain No Abnormalities Noted: No Temperature: No Abnormality Dry / Scaly: No Tenderness on Palpation: Yes Maceration: No Moist: Yes Wound Preparation Ulcer Cleansing: Rinsed/Irrigated with Saline Topical Anesthetic Applied: Other: lidocaine 4%, Treatment Notes Wound #1 (Left, Lateral Lower Leg) 1. Cleansed with: Clean wound with Normal Saline 2. Anesthetic Topical Lidocaine 4% cream to wound bed prior to debridement 4. Dressing Applied: Hydrafera Blue 5. Secondary Dressing Applied Bordered Foam Dressing Electronic Signature(s) Signed: 04/11/2015 3:59:50 PM By: Alric Quan Entered By: Alric Quan on 04/11/2015 13:51:01 Jacob [name] ([id number]) -------------------------------------------------------------------------------- Vitals Details Patient Name: Jacob [name], Vidit A. Date of Service: 04/11/2015 1:45 PM Medical Record Number: [id number] Patient Account Number: [id number] Date of Birth/Sex: [birth year] (44 y.o. Male) Treating RN: Ahmed Prima Primary Care Physician: Gayland Curry Other Clinician: Referring Physician: Gayland Curry Treating Physician/Extender: Frann Rider in Treatment: 4 Vital Signs Time Taken: 13:44 Temperature (F): 97.8 Height (in): 72 Pulse (bpm): 66 Weight (lbs): 360 Respiratory Rate (breaths/min): 20 Body Mass Index (BMI): 48.8 Blood Pressure (mmHg): 166/84 Reference Range: 80 - 120 mg / dl Electronic Signature(s) Signed: 04/11/2015 3:59:50 PM By: Alric Quan Entered By: Alric Quan  on 04/11/2015 13:48:23

## 2015-04-18 ENCOUNTER — Encounter: Payer: BC Managed Care – PPO | Admitting: Surgery

## 2015-04-18 DIAGNOSIS — E11622 Type 2 diabetes mellitus with other skin ulcer: Secondary | ICD-10-CM | POA: Diagnosis not present

## 2015-04-18 NOTE — Progress Notes (Addendum)
AMMAN, HENRICH ([id number]) Visit Report for 04/18/2015 Chief Complaint Document Details Patient Name: DEDO, [name] Date of Service: 04/18/2015 9:45 AM Medical Record Number: [id number] Patient Account Number: [id number] Date of Birth/Sex: Oct 29, 1970 (44 y.o. Male) Treating RN: Cornell Barman Primary Care Physician: Gayland Curry Other Clinician: Referring Physician: Gayland Curry Treating Physician/Extender: Frann Rider in Treatment: 5 Information Obtained from: Patient Chief Complaint Patient presents to the wound care center for a consult due non healing wound of the left lower extremity which he has had for about a month and a half. Electronic Signature(s) Signed: 04/18/2015 10:18:26 AM By: Christin Fudge MD, FACS Entered By: Christin Fudge on 04/18/2015 10:18:26 [name] ([id number]) -------------------------------------------------------------------------------- HPI Details Patient Name: Jacob [name], Jandel A. Date of Service: 04/18/2015 9:45 AM Medical Record Number: [id number] Patient Account Number: [id number] Date of Birth/Sex: 1971/01/18 (44 y.o. Male) Treating RN: Cornell Barman Primary Care Physician: Gayland Curry Other Clinician: Referring Physician: Gayland Curry Treating Physician/Extender: Frann Rider in Treatment: 5 History of Present Illness Location: left lower extremity laterally Quality: Patient reports experiencing a dull pain to affected area(s). Severity: Patient states wound (s) are getting better. Duration: Patient has had the wound for < 6 weeks prior to presenting for treatment Timing: Pain in wound is Intermittent (comes and goes Context: The wound occurred when the patient had a shave biopsy of a lesion on that leg which was benign Modifying Factors: Other treatment(s) tried include:was put on Keflex and Bactrim by his PCP Associated Signs and Symptoms: Patient reports presence of swelling HPI Description:  Pleasant 44 year old gentleman who had a shave biopsy done from his left lower extremity on October 3 and the wound has not healed since then. This was a benign disease and he was concerned about the length of time it has taken to heal. He has past medical history of diabetes mellitus, gout, hypertension, sleep apnea and is status post tonsillectomy and adenoidectomy, cholecystectomy and uvulopalatopharyngoplasty. he was recently seen by his PCP Dr. Jodi Mourning who had already given him Keflex for a week and then put him on Bactrim for 10 days. the patient was a smoker before and quit in 1995 but currently chews tobacco on a daily basis. Electronic Signature(s) Signed: 04/18/2015 10:18:32 AM By: Christin Fudge MD, FACS Entered By: Christin Fudge on 04/18/2015 10:18:32 [name] ([id number]) -------------------------------------------------------------------------------- Otelia Sergeant TISS Details Patient Name: Jacob [name], [name] Date of Service: 04/18/2015 9:45 AM Medical Record Number: [id number] Patient Account Number: [id number] Date of Birth/Sex: 1970/12/11 (44 y.o. Male) Treating RN: Cornell Barman Primary Care Physician: Gayland Curry Other Clinician: Referring Physician: Gayland Curry Treating Physician/Extender: Frann Rider in Treatment: 5 Procedure Performed for: Wound #1 Left,Lateral Lower Leg Performed By: Physician Christin Fudge, MD Post Procedure Diagnosis Same as Pre-procedure Notes he has some hyper granulation tissue which I have used silver nitrate to cauterize chemically. Electronic Signature(s) Signed: 04/18/2015 10:18:20 AM By: Christin Fudge MD, FACS Entered By: Christin Fudge on 04/18/2015 10:18:20 [name] ([id number]) -------------------------------------------------------------------------------- Physical Exam Details Patient Name: Jacob [name], [name] Date of Service: 04/18/2015 9:45 AM Medical Record Number:  [id number] Patient Account Number: [id number] Date of Birth/Sex: 08/12/1970 (44 y.o. Male) Treating RN: Cornell Barman Primary Care Physician: Gayland Curry Other Clinician: Referring Physician: Gayland Curry Treating Physician/Extender: Frann Rider in Treatment: 5 Constitutional . Pulse regular. Respirations normal and unlabored. Afebrile. . Eyes Nonicteric. Reactive to light. Ears, Nose, Mouth, and Throat Lips, teeth, and gums WNL.Marland Kitchen Moist mucosa without lesions. Neck supple  and nontender. No palpable supraclavicular or cervical adenopathy. Normal sized without goiter. Respiratory WNL. No retractions.. Cardiovascular Pedal Pulses WNL. No clubbing, cyanosis or edema. Lymphatic No adneopathy. No adenopathy. No adenopathy. Musculoskeletal Adexa without tenderness or enlargement.. Digits and nails w/o clubbing, cyanosis, infection, petechiae, ischemia, or inflammatory conditions.. Integumentary (Hair, Skin) No suspicious lesions. No crepitus or fluctuance. No peri-wound warmth or erythema. No masses.Marland Kitchen Psychiatric Judgement and insight Intact.. No evidence of depression, anxiety, or agitation.. Notes there is slight hyper granulation tissue in the middle of the wound and the edges are nicely closing in. I used silver nitrate to chemically cauterize this. The mild redness around the wound is not cellulitis but more a reaction to the [facility]. Electronic Signature(s) Signed: 04/18/2015 10:19:15 AM By: Christin Fudge MD, FACS Entered By: Christin Fudge on 04/18/2015 10:19:14 [name] ([id number]) -------------------------------------------------------------------------------- Physician Orders Details Patient Name: Jacob [name], [name] Date of Service: 04/18/2015 9:45 AM Medical Record Number: [id number] Patient Account Number: [id number] Date of Birth/Sex: [birth year] (44 y.o. Male) Treating RN: Ahmed Prima Primary Care Physician: Gayland Curry Other  Clinician: Referring Physician: Gayland Curry Treating Physician/Extender: Frann Rider in Treatment: 5 Verbal / Phone Orders: Yes Clinician: Pinkerton, Debi Read Back and Verified: Yes Diagnosis Coding Wound Cleansing Wound #1 Left,Lateral Lower Leg o Clean wound with Normal Saline. Anesthetic Wound #1 Left,Lateral Lower Leg o Topical Lidocaine 4% cream applied to wound bed prior to debridement Primary Wound Dressing Wound #1 Left,Lateral Lower Leg o Aquacel Ag Secondary Dressing Wound #1 Left,Lateral Lower Leg o Boardered Foam Dressing Dressing Change Frequency Wound #1 Left,Lateral Lower Leg o Change dressing every other day. Follow-up Appointments Wound #1 Left,Lateral Lower Leg o Return Appointment in 1 week. Edema Control Wound #1 Left,Lateral Lower Leg o Elevate legs to the level of the heart and pump ankles as often as possible o Support Garment 20-30 mm/Hg pressure to: - get compression hose at local drug store Electronic Signature(s) Signed: 04/18/2015 4:08:32 PM By: Christin Fudge MD, FACS Signed: 04/19/2015 4:19:08 PM By: Alric Quan Entered By: Alric Quan on 04/18/2015 10:18:07 [name] ([id number]) Jacob [name], Kerby A. ([id number]) -------------------------------------------------------------------------------- Problem List Details Patient Name: Jacob [name], Leanard A. Date of Service: 04/18/2015 9:45 AM Medical Record Number: [id number] Patient Account Number: [id number] Date of Birth/Sex: 06-Nov-1970 (44 y.o. 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 Date of Service: 04/18/2015 9:45 AM Medical Record Number: [id number] Patient Account Number: [id number] Date of Birth/Sex: 10-07-1970 (44 y.o. Male) Treating RN: Cornell Barman Primary Care Physician: Gayland Curry Other Clinician: Referring Physician: Gayland Curry Treating Physician/Extender: Frann Rider in Treatment: 5 Subjective Chief Complaint Information obtained from Patient Patient presents to the wound care center for a consult due non healing wound of the left lower extremity which he has had for about a  month and a half. History of Present Illness (HPI) The following HPI elements were documented for the patient's wound: Location: left lower extremity laterally Quality: Patient reports experiencing a dull pain to affected area(s). Severity: Patient states wound (s) are getting better. Duration: Patient has had the wound for < 6 weeks prior to presenting for treatment Timing: Pain in wound is Intermittent (comes and goes Context: The wound occurred when the patient had a shave biopsy of a lesion on that leg which was benign Modifying Factors: Other treatment(s) tried include:was put on Keflex and Bactrim by his PCP Associated Signs and Symptoms: Patient reports presence of swelling Pleasant 44 year old gentleman who had a  shave biopsy done from his left lower extremity on October 3 and the wound has not healed since then. This was a benign disease and he was concerned about the length of time it has taken to heal. He has past medical history of diabetes mellitus, gout, hypertension, sleep apnea and is status post tonsillectomy and adenoidectomy, cholecystectomy and uvulopalatopharyngoplasty. he was recently seen by his PCP Dr. Jodi Mourning who had already given him Keflex for a week and then put him on Bactrim for 10 days. the patient was a smoker before and quit in 1995 but currently chews tobacco on a daily basis. Objective Constitutional Pulse regular. Respirations normal and unlabored. Afebrile. Vitals Time Taken: 10:04 AM, Height: 72 in, Weight: 360 lbs, BMI: 48.8, Temperature: 97.7 F, Pulse: 56 bpm, Respiratory Rate: 20 breaths/min, Blood Pressure: 169/83 mmHg. Dibiasio, Jarold AMarland Kitchen ([id number]) Eyes Nonicteric. Reactive to light. Ears, Nose, Mouth, and Throat Lips, teeth, and gums WNL.Marland Kitchen Moist mucosa without lesions. Neck supple and nontender. No palpable supraclavicular or cervical adenopathy. Normal sized without goiter. Respiratory WNL. No retractions.. Cardiovascular Pedal Pulses WNL. No clubbing, cyanosis or edema. Lymphatic No adneopathy. No adenopathy. No adenopathy. Musculoskeletal Adexa without tenderness or enlargement.. Digits and nails w/o clubbing, cyanosis, infection, petechiae, ischemia, or inflammatory conditions.Marland Kitchen Psychiatric Judgement and insight Intact.. No evidence of depression, anxiety, or agitation.. General Notes: there is slight hyper granulation tissue in the middle of the wound and the edges are nicely closing in. I used silver nitrate to chemically cauterize this. The mild redness around the wound is not cellulitis but more a reaction to the [facility]. Integumentary (Hair, Skin) No suspicious lesions. No crepitus or fluctuance. No peri-wound warmth or erythema.  No masses.. Wound #1 status is Open. Original cause of wound was Surgical Injury. The wound is located on the Left,Lateral Lower Leg. The wound measures 1.7cm length x 1.7cm width x 0.1cm depth; 2.27cm^2 area and 0.227cm^3 volume. The wound is limited to skin breakdown. There is no tunneling or undermining noted. There is a large amount of purulent drainage noted. The wound margin is flat and intact. There is large (67-100%) red granulation within the wound bed. There is a small (1-33%) amount of necrotic tissue within the wound bed including Adherent Slough. The periwound skin appearance exhibited: Localized Edema, Moist, Erythema. The periwound skin appearance did not exhibit: Callus, Crepitus, Excoriation, Fluctuance, Friable, Induration, Rash, Scarring, Dry/Scaly, Maceration, Atrophie Blanche, Cyanosis, Ecchymosis, Hemosiderin Staining, Mottled, Pallor, Rubor. The surrounding wound skin color is noted with erythema which is circumferential. Periwound temperature was noted as No Abnormality. The periwound has tenderness on palpation. [name] ([id number]) Assessment Active Problems ICD-10 E11.622 - Type 2 diabetes mellitus with other skin ulcer T81.31XA - Disruption of external operation (surgical) wound, not elsewhere classified, initial encounter L97.222 - Non-pressure chronic ulcer of left  calf with fat layer exposed F17.228 - Nicotine dependence, chewing tobacco, with other nicotine-induced disorders E66.01 - Morbid (severe) obesity due to excess calories I have asked him to use silver alginate on alternate days with a bordered foam and have also told him to go to the pharmacy and pick up compression stockings of the 20-30 mm variety and wear them every day all day long. He is agreeable and will come back and see as next week. Procedures Wound #1 Wound #1 is an Open Surgical Wound located on the Left,Lateral Lower Leg . An CHEM CAUT GRANULATION TISS procedure was performed  by Christin Fudge, MD. Post procedure Diagnosis Wound #1: Same as Pre-Procedure Notes: he has some hyper granulation tissue which I have used silver nitrate to cauterize chemically. Plan Wound Cleansing: Wound #1 Left,Lateral Lower Leg: Clean wound with Normal Saline. Anesthetic: Wound #1 Left,Lateral Lower Leg: Topical Lidocaine 4% cream applied to wound bed prior to debridement Primary Wound Dressing: Wound #1 Left,Lateral Lower Leg: Aquacel Ag Secondary Dressing: Wound #1 Left,Lateral Lower Leg: Boardered Foam Dressing Dressing Change Frequency: Wound #1 Left,Lateral Lower Leg: Dirosa, Jamarkis A. ([id number]) Change dressing every other day. Follow-up Appointments: Wound #1 Left,Lateral Lower Leg: Return Appointment in 1 week. Edema Control: Wound #1 Left,Lateral Lower Leg: Elevate legs to the level of the heart and pump ankles as often as possible Support Garment 20-30 mm/Hg pressure to: - get compression hose at local drug store I have asked him to use silver alginate on alternate days with a bordered foam and have also told him to go to the pharmacy and pick up compression stockings of the 20-30 mm variety and wear them every day all day long. He is agreeable and will come back and see as next week. Electronic Signature(s) Signed: 04/18/2015 10:19:49 AM By: Christin Fudge MD, FACS Entered By: Christin Fudge on 04/18/2015 10:19:49 [name] ([id number]) -------------------------------------------------------------------------------- SuperBill Details Patient Name: Jacob [name], Davonta A. Date of Service: 04/18/2015 Medical Record Number: [id number] Patient Account Number: [id number] Date of Birth/Sex: 11/20/1970 (44 y.o. Male) Treating RN: Cornell Barman Primary Care Physician: Gayland Curry Other Clinician: Referring Physician: Gayland Curry Treating Physician/Extender: Frann Rider in Treatment: 5 Diagnosis Coding ICD-10 Codes Code  Description [phone] Type 2 diabetes mellitus with other skin ulcer Disruption of external operation (surgical) wound, not elsewhere classified, initial T81.31XA encounter L97.222 Non-pressure chronic ulcer of left calf with fat layer exposed F17.228 Nicotine dependence, chewing tobacco, with other nicotine-induced disorders E66.01 Morbid (severe) obesity due to excess calories Facility Procedures CPT4: Description Modifier Quantity Code [id number] - CHEM CAUT GRANULATION TISS 1 ICD-10 Description Diagnosis E11.622 Type 2 diabetes mellitus with other skin ulcer T81.31XA Disruption of external operation (surgical) wound, not elsewhere  classified, initial encounter L97.222 Non-pressure chronic ulcer of left calf with fat layer exposed Physician Procedures CPT4: Description Modifier Quantity Code [id number] - WC PHYS CHEM CAUT GRAN TISSUE 1 ICD-10 Description Diagnosis E11.622 Type 2 diabetes mellitus with other skin ulcer T81.31XA Disruption of external operation (surgical) wound, not elsewhere  classified, initial encounter L97.222 Non-pressure chronic ulcer of left calf with fat layer exposed Electronic Signature(s) Signed: 04/18/2015 10:20:06 AM By: Christin Fudge MD, FACS [place], Parris AMarland Kitchen ([id number]) Entered By: Christin Fudge on 04/18/2015 10:20:06

## 2015-04-20 NOTE — Progress Notes (Signed)
AMUEL, [name] ([id number]) Visit Report for 04/18/2015 Arrival Information Details Patient Name: Jacob [name], [name] Date of Service: 04/18/2015 9:45 AM Medical Record Number: [id number] Patient Account Number: [id number] Date of Birth/Sex: 02-16-1971 (44 y.o. Male) Treating RN: Ahmed Prima Primary Care Physician: Gayland Curry Other Clinician: Referring Physician: Gayland Curry Treating Physician/Extender: Frann Rider in Treatment: 5 Visit Information History Since Last Visit All ordered tests and consults were completed: No Patient Arrived: Ambulatory Added or deleted any medications: No Arrival Time: 10:03 Any new allergies or adverse reactions: No Accompanied By: self Had a fall or experienced change in No Transfer Assistance: None activities of daily living that may affect Patient Identification Verified: Yes risk of falls: Secondary Verification Process Yes Signs or symptoms of abuse/neglect since last No Completed: visito Patient Requires Transmission-Based No Hospitalized since last visit: No Precautions: Pain Present Now: No Patient Has Alerts: Yes Patient Alerts: DMII Electronic Signature(s) Signed: 04/19/2015 4:19:08 PM By: Alric Quan Entered By: Alric Quan on 04/18/2015 10:03:38 [name] ([id number]) -------------------------------------------------------------------------------- Encounter Discharge Information Details Patient Name: Jacob [name], [name] Date of Service: 04/18/2015 9:45 AM Medical Record Number: [id number] Patient Account Number: [id number] Date of Birth/Sex: 17-Apr-1971 (44 y.o. Male) Treating RN: Ahmed Prima Primary Care Physician: Gayland Curry Other Clinician: Referring Physician: Gayland Curry Treating Physician/Extender: Frann Rider in Treatment: 5 Encounter Discharge Information Items Discharge Pain Level: 0 Discharge Condition: Stable Ambulatory Status:  Ambulatory Discharge Destination: Home Transportation: Private Auto Accompanied By: self Schedule Follow-up Appointment: Yes Medication Reconciliation completed and provided to Patient/Care Yes [place]: Provided on Clinical Summary of Care: 04/18/2015 Form Type Recipient Paper Patient [facility] Electronic Signature(s) Signed: 04/18/2015 10:34:01 AM By: Ruthine Dose Entered By: Ruthine Dose on 04/18/2015 10:34:01 [name] ([id number]) -------------------------------------------------------------------------------- Lower Extremity Assessment Details Patient Name: Jacob [name], [name] Date of Service: 04/18/2015 9:45 AM Medical Record Number: [id number] Patient Account Number: [id number] Date of Birth/Sex: 1970-08-11 (44 y.o. Male) Treating RN: Carolyne Fiscal, Debi Primary Care Physician: Gayland Curry Other Clinician: Referring Physician: Gayland Curry Treating Physician/Extender: Frann Rider in Treatment: 5 Edema Assessment Assessed: [Left: No] [Right: No] Edema: [Left: N] [Right: o] Calf Left: Right: Point of Measurement: 37 cm From Medial Instep 49 cm cm Ankle Left: Right: Point of Measurement: 12 cm From Medial Instep 30 cm cm Vascular Assessment Pulses: Posterior Tibial Dorsalis Pedis Palpable: [Left:Yes] Extremity colors, hair growth, and conditions: Extremity Color: [Left:Normal] Hair Growth on Extremity: [Left:Yes] Temperature of Extremity: [Left:Warm] Capillary Refill: [Left:< 3 seconds] Toe Nail Assessment Left: Right: Thick: No Discolored: No Deformed: No Improper Length and Hygiene: No Electronic Signature(s) Signed: 04/19/2015 4:19:08 PM By: Alric Quan Entered By: Alric Quan on 04/18/2015 10:26:05 [name] ([id number]) -------------------------------------------------------------------------------- Multi Wound Chart Details Patient Name: Jacob [name], [name] Date of Service: 04/18/2015 9:45 AM Medical Record  Number: [id number] Patient Account Number: [id number] Date of Birth/Sex: [birth year] (44 y.o. Male) Treating RN: Ahmed Prima Primary Care Physician: Gayland Curry Other Clinician: Referring Physician: Gayland Curry Treating Physician/Extender: Frann Rider in Treatment: 5 Vital Signs Height(in): 72 Pulse(bpm): 56 Weight(lbs): 360 Blood Pressure 169/83 (mmHg): Body Mass Index(BMI): 49 Temperature(F): 97.7 Respiratory Rate 20 (breaths/min): Photos: [1:No Photos] [N/A:N/A] Wound Location: [1:Left Lower Leg - Lateral] [N/A:N/A] Wounding Event: [1:Surgical Injury] [N/A:N/A] Primary Etiology: [1:Open Surgical Wound] [N/A:N/A] Comorbid History: [1:Sleep Apnea, Hypertension, Type II Diabetes, Gout] [N/A:N/A] Date Acquired: [1:01/30/2015] [N/A:N/A] Weeks of Treatment: [1:5] [N/A:N/A] Wound Status: [1:Open] [N/A:N/A] Measurements L x W x D 1.7x1.7x0.1 [N/A:N/A] (cm) Area (cm) : [1:2.27] [N/A:N/A] Volume (  cm) : [1:0.227] [N/A:N/A] % Reduction in Area: [1:19.70%] [N/A:N/A] % Reduction in Volume: 19.80% [N/A:N/A] Classification: [1:Full Thickness Without Exposed Support Structures] [N/A:N/A] HBO Classification: [1:Grade 1] [N/A:N/A] Exudate Amount: [1:Large] [N/A:N/A] Exudate Type: [1:Purulent] [N/A:N/A] Exudate Color: [1:yellow, brown, green] [N/A:N/A] Wound Margin: [1:Flat and Intact] [N/A:N/A] Granulation Amount: [1:Large (67-100%)] [N/A:N/A] Granulation Quality: [1:Red] [N/A:N/A] Necrotic Amount: [1:Small (1-33%)] [N/A:N/A] Exposed Structures: [1:Fascia: No Fat: No Tendon: No] [N/A:N/A] Muscle: No Joint: No Bone: No Limited to Skin Breakdown Epithelialization: None N/A N/A Periwound Skin Texture: Edema: Yes N/A N/A Excoriation: No Induration: No Callus: No Crepitus: No Fluctuance: No Friable: No Rash: No Scarring: No Periwound Skin Moist: Yes N/A N/A Moisture: Maceration: No Dry/Scaly: No Periwound Skin Color: Erythema: Yes N/A N/A Atrophie  Blanche: No Cyanosis: No Ecchymosis: No Hemosiderin Staining: No Mottled: No Pallor: No Rubor: No Erythema Location: Circumferential N/A N/A Temperature: No Abnormality N/A N/A Tenderness on Yes N/A N/A Palpation: Wound Preparation: Ulcer Cleansing: N/A N/A Rinsed/Irrigated with Saline Topical Anesthetic Applied: Other: lidocaine 4% Treatment Notes Electronic Signature(s) Signed: 04/19/2015 4:19:08 PM By: Alric Quan Entered By: Alric Quan on 04/18/2015 10:09:41 [name] ([id number]) -------------------------------------------------------------------------------- [place] Details Patient Name: Jacob [name], Eunice A. Date of Service: 04/18/2015 9:45 AM Medical Record Number: [id number] Patient Account Number: [id number] Date of Birth/Sex: October 01, 1970 (44 y.o. Male) Treating RN: Ahmed Prima Primary Care Physician: Gayland Curry Other Clinician: Referring Physician: Gayland Curry Treating Physician/Extender: Frann Rider in Treatment: 5 Active Inactive Orientation to the Wound Care Program Nursing Diagnoses: Knowledge deficit related to the wound healing center program Goals: Patient/caregiver will verbalize understanding of the [place] Program Date Initiated: 03/14/2015 Goal Status: Active Interventions: Provide education on orientation to the wound center Notes: Wound/Skin Impairment Nursing Diagnoses: Impaired tissue integrity Goals: Patient/caregiver will verbalize understanding of skin care regimen Date Initiated: 03/14/2015 Goal Status: Active Ulcer/skin breakdown will have a volume reduction of 30% by week 4 Date Initiated: 03/14/2015 Goal Status: Active Ulcer/skin breakdown will have a volume reduction of 50% by week 8 Date Initiated: 03/14/2015 Goal Status: Active Ulcer/skin breakdown will have a volume reduction of 80% by week 12 Date Initiated: 03/14/2015 Goal Status:  Active Ulcer/skin breakdown will heal within 14 weeks Date Initiated: 03/14/2015 Goal Status: Active CASHTYN, TONKS ([id number]) Interventions: Assess patient/caregiver ability to perform ulcer/skin care regimen upon admission and as needed Assess ulceration(s) every visit Notes: Electronic Signature(s) Signed: 04/19/2015 4:19:08 PM By: Alric Quan Entered By: Alric Quan on 04/18/2015 10:09:35 [name] ([id number]) -------------------------------------------------------------------------------- Pain Assessment Details Patient Name: Jacob [name], Averey A. Date of Service: 04/18/2015 9:45 AM Medical Record Number: [id number] Patient Account Number: [id number] Date of Birth/Sex: [birth year] (44 y.o. Male) Treating RN: Ahmed Prima Primary Care Physician: Gayland Curry Other Clinician: Referring Physician: Gayland Curry Treating Physician/Extender: Frann Rider in Treatment: 5 Active Problems Location of Pain Severity and Description of Pain Patient Has Paino No Site Locations Pain Management and Medication Current Pain Management: Electronic Signature(s) Signed: 04/19/2015 4:19:08 PM By: Alric Quan Entered By: Alric Quan on 04/18/2015 10:03:45 [name] ([id number]) -------------------------------------------------------------------------------- Patient/Caregiver Education Details Patient Name: Jacob [name], Azar A. Date of Service: 04/18/2015 9:45 AM Medical Record Number: [id number] Patient Account Number: [id number] Date of Birth/Gender: [birth year] (44 y.o. Male) Treating RN: Ahmed Prima Primary Care Physician: Gayland Curry Other Clinician: Referring Physician: Gayland Curry Treating Physician/Extender: Frann Rider in Treatment: 5 Education Assessment Education Provided To: Patient Education Topics Provided Wound/Skin Impairment: Handouts: Other: change dressing as ordered Methods:  Demonstration, Explain/Verbal  Responses: State content correctly Electronic Signature(s) Signed: 04/19/2015 4:19:08 PM By: Alric Quan Entered By: Alric Quan on 04/18/2015 10:19:31 [name] ([id number]) -------------------------------------------------------------------------------- Wound Assessment Details Patient Name: Jacob [name], Davari A. Date of Service: 04/18/2015 9:45 AM Medical Record Number: [id number] Patient Account Number: [id number] Date of Birth/Sex: 03/23/1971 (44 y.o. Male) Treating RN: Ahmed Prima Primary Care Physician: Gayland Curry Other Clinician: Referring Physician: Gayland Curry Treating Physician/Extender: Frann Rider in Treatment: 5 Wound Status Wound Number: 1 Primary Open Surgical Wound Etiology: Wound Location: Left Lower Leg - Lateral Wound Status: Open Wounding Event: Surgical Injury Comorbid Sleep Apnea, Hypertension, Type II Date Acquired: 01/30/2015 History: Diabetes, Gout Weeks Of Treatment: 5 Clustered Wound: No Photos Photo Uploaded By: Alric Quan on 04/18/2015 15:32:53 Wound Measurements Length: (cm) 1.7 Width: (cm) 1.7 Depth: (cm) 0.1 Area: (cm) 2.27 Volume: (cm) 0.227 % Reduction in Area: 19.7% % Reduction in Volume: 19.8% Epithelialization: None Tunneling: No Undermining: No Wound Description Full Thickness Without Classification: Exposed Support Structures Diabetic Severity Grade 1 (Wagner): Wound Margin: Flat and Intact Exudate Amount: Large Exudate Type: Purulent Exudate Color: yellow, brown, green Foul Odor After Cleansing: No Wound Bed Granulation Amount: Large (67-100%) Exposed Structure Granulation Quality: Red Fascia Exposed: No Raysor, Ronnie A. ([id number]) Necrotic Amount: Small (1-33%) Fat Layer Exposed: No Necrotic Quality: Adherent Slough Tendon Exposed: No Muscle Exposed: No Joint Exposed: No Bone Exposed: No Limited to Skin Breakdown Periwound Skin  Texture Texture Color No Abnormalities Noted: No No Abnormalities Noted: No Callus: No Atrophie Blanche: No Crepitus: No Cyanosis: No Excoriation: No Ecchymosis: No Fluctuance: No Erythema: Yes Friable: No Erythema Location: Circumferential Induration: No Hemosiderin Staining: No Localized Edema: Yes Mottled: No Rash: No Pallor: No Scarring: No Rubor: No Moisture Temperature / Pain No Abnormalities Noted: No Temperature: No Abnormality Dry / Scaly: No Tenderness on Palpation: Yes Maceration: No Moist: Yes Wound Preparation Ulcer Cleansing: Rinsed/Irrigated with Saline Topical Anesthetic Applied: Other: lidocaine 4%, Treatment Notes Wound #1 (Left, Lateral Lower Leg) 1. Cleansed with: Clean wound with Normal Saline 2. Anesthetic Topical Lidocaine 4% cream to wound bed prior to debridement 4. Dressing Applied: Aquacel Ag 5. Secondary Dressing Applied Bordered Foam Dressing 7. Secured with Support Garment 20-30 mm/Hg pressure to: Electronic Signature(s) Signed: 04/19/2015 4:19:08 PM By: Alric Quan Entered By: Alric Quan on 04/18/2015 10:08:40 [name] ([id number]) Jacob [name], Conlan AMarland Kitchen ([id number]) -------------------------------------------------------------------------------- [place] Details Patient Name: Jacob [name], Marcel A. Date of Service: 04/18/2015 9:45 AM Medical Record Number: [id number] Patient Account Number: [id number] Date of Birth/Sex: 1970/11/09 (44 y.o. Male) Treating RN: Ahmed Prima Primary Care Physician: Gayland Curry Other Clinician: Referring Physician: Gayland Curry Treating Physician/Extender: Frann Rider in Treatment: 5 Vital Signs Time Taken: 10:04 Temperature (F): 97.7 Height (in): 72 Pulse (bpm): 56 Weight (lbs): 360 Respiratory Rate (breaths/min): 20 Body Mass Index (BMI): 48.8 Blood Pressure (mmHg): 169/83 Reference Range: 80 - 120 mg / dl Electronic Signature(s) Signed: 04/19/2015  4:19:08 PM By: Alric Quan Entered By: Alric Quan on 04/18/2015 10:04:23

## 2015-04-25 ENCOUNTER — Encounter: Payer: Self-pay | Admitting: General Surgery

## 2015-04-25 ENCOUNTER — Encounter (HOSPITAL_BASED_OUTPATIENT_CLINIC_OR_DEPARTMENT_OTHER): Payer: BC Managed Care – PPO | Admitting: General Surgery

## 2015-04-25 DIAGNOSIS — L97321 Non-pressure chronic ulcer of left ankle limited to breakdown of skin: Secondary | ICD-10-CM

## 2015-04-25 DIAGNOSIS — E11622 Type 2 diabetes mellitus with other skin ulcer: Secondary | ICD-10-CM | POA: Diagnosis not present

## 2015-04-25 NOTE — Progress Notes (Signed)
seeiheal 

## 2015-04-26 NOTE — Progress Notes (Signed)
LEODAN, GERARD ([id number]) Visit Report for 04/25/2015 Arrival Information Details Patient Name: Jacob [name], [name] Date of Service: 04/25/2015 4:00 PM Medical Record Number: [id number] Patient Account Number: [id number] Date of Birth/Sex: [birth year] (44 y.o. Male) Treating RN: Baruch Gouty, RN, BSN, Velva Harman Primary Care Physician: Gayland Curry Other Clinician: Referring Physician: Gayland Curry Treating Physician/Extender: Benjaman Pott in Treatment: 6 Visit Information History Since Last Visit Any new allergies or adverse reactions: No Patient Arrived: Ambulatory Had a fall or experienced change in No Arrival Time: 15:53 activities of daily living that may affect Accompanied By: self risk of falls: Transfer Assistance: None Signs or symptoms of abuse/neglect since last No Patient Identification Verified: Yes visito Secondary Verification Process Yes Has Dressing in Place as Prescribed: Yes Completed: Pain Present Now: No Patient Requires Transmission-Based No Precautions: Patient Has Alerts: Yes Patient Alerts: DMII Electronic Signature(s) Signed: 04/25/2015 5:06:20 PM By: Regan Lemming BSN, RN Entered By: Regan Lemming on 04/25/2015 15:53:27 Rigor, [name] ([id number]) -------------------------------------------------------------------------------- Clinic Level of Care Assessment Details Patient Name: Jacob [name], [name] Date of Service: 04/25/2015 4:00 PM Medical Record Number: [id number] Patient Account Number: [id number] Date of Birth/Sex: [birth year] (44 y.o. Male) Treating RN: Baruch Gouty, RN, BSN, [place] Primary Care Physician: Gayland Curry Other Clinician: Referring Physician: Gayland Curry Treating Physician/Extender: Benjaman Pott in Treatment: 6 Clinic Level of Care Assessment Items TOOL 4 Quantity Score []  - Use when only an EandM is performed on FOLLOW-UP visit 0 ASSESSMENTS - Nursing Assessment / Reassessment X - Reassessment of  Co-morbidities (includes updates in patient status) 1 10 X - Reassessment of Adherence to Treatment Plan 1 5 ASSESSMENTS - Wound and Skin Assessment / Reassessment X - Simple Wound Assessment / Reassessment - one wound 1 5 []  - Complex Wound Assessment / Reassessment - multiple wounds 0 []  - Dermatologic / Skin Assessment (not related to wound area) 0 ASSESSMENTS - Focused Assessment []  - Circumferential Edema Measurements - multi extremities 0 []  - Nutritional Assessment / Counseling / Intervention 0 X - Lower Extremity Assessment (monofilament, tuning fork, pulses) 1 5 []  - Peripheral Arterial Disease Assessment (using hand held doppler) 0 ASSESSMENTS - Ostomy and/or Continence Assessment and Care []  - Incontinence Assessment and Management 0 []  - Ostomy Care Assessment and Management (repouching, etc.) 0 PROCESS - Coordination of Care X - Simple Patient / Family Education for ongoing care 1 15 []  - Complex (extensive) Patient / Family Education for ongoing care 0 []  - Staff obtains Programmer, systems, Records, Test Results / Process Orders 0 []  - Staff telephones HHA, Nursing Homes / Clarify orders / etc 0 []  - Routine Transfer to another Facility (non-emergent condition) 0 [name], [name] ([id number]) []  - Routine Hospital Admission (non-emergent condition) 0 []  - New Admissions / Biomedical engineer / Ordering NPWT, Apligraf, etc. 0 []  - Emergency Hospital Admission (emergent condition) 0 []  - Simple Discharge Coordination 0 []  - Complex (extensive) Discharge Coordination 0 PROCESS - Special Needs []  - Pediatric / Minor Patient Management 0 []  - Isolation Patient Management 0 []  - Hearing / Language / Visual special needs 0 []  - Assessment of Community assistance (transportation, D/C planning, etc.) 0 []  - Additional assistance / Altered mentation 0 []  - Support Surface(s) Assessment (bed, cushion, seat, etc.) 0 INTERVENTIONS - Wound Cleansing / Measurement X - Simple Wound  Cleansing - one wound 1 5 []  - Complex Wound Cleansing - multiple wounds 0 []  - Wound Imaging (photographs - any number of wounds) 0 []  - Wound Tracing (instead of  photographs) 0 X - Simple Wound Measurement - one wound 1 5 []  - Complex Wound Measurement - multiple wounds 0 INTERVENTIONS - Wound Dressings X - Small Wound Dressing one or multiple wounds 1 10 []  - Medium Wound Dressing one or multiple wounds 0 []  - Large Wound Dressing one or multiple wounds 0 []  - Application of Medications - topical 0 []  - Application of Medications - injection 0 INTERVENTIONS - Miscellaneous []  - External ear exam 0 Jacob [name], [name] ([id number]) []  - Specimen Collection (cultures, biopsies, blood, body fluids, etc.) 0 []  - Specimen(s) / Culture(s) sent or taken to Lab for analysis 0 []  - Patient Transfer (multiple staff / Harrel Lemon Lift / Similar devices) 0 []  - Simple Staple / Suture removal (25 or less) 0 []  - Complex Staple / Suture removal (26 or more) 0 []  - Hypo / Hyperglycemic Management (close monitor of Blood Glucose) 0 []  - Ankle / Brachial Index (ABI) - do not check if billed separately 0 X - Vital Signs 1 5 Has the patient been seen at the hospital within the last three years: Yes Total Score: 65 Level Of Care: New/Established - Level 2 Electronic Signature(s) Signed: 04/25/2015 4:12:34 PM By: Regan Lemming BSN, RN Entered By: Regan Lemming on 04/25/2015 16:12:33 [name] ([id number]) -------------------------------------------------------------------------------- Encounter Discharge Information Details Patient Name: Jacob [name], [name] Date of Service: 04/25/2015 4:00 PM Medical Record Number: [id number] Patient Account Number: [id number] Date of Birth/Sex: [birth year] (44 y.o. Male) Treating RN: Baruch Gouty, RN, BSN, Velva Harman Primary Care Physician: Gayland Curry Other Clinician: Referring Physician: Gayland Curry Treating Physician/Extender: Benjaman Pott in Treatment:  6 Encounter Discharge Information Items Discharge Pain Level: 0 Discharge Condition: Stable Ambulatory Status: Ambulatory Discharge Destination: Home Private Transportation: Auto Accompanied By: self Schedule Follow-up Appointment: No Medication Reconciliation completed and No provided to Patient/Care Alyanah Elliott: Clinical Summary of Care: Electronic Signature(s) Signed: 04/25/2015 4:21:43 PM By: Judene Companion MD Previous Signature: 04/25/2015 4:13:38 PM Version By: Regan Lemming BSN, RN Entered By: Judene Companion on 04/25/2015 16:21:43 [name] ([id number]) -------------------------------------------------------------------------------- Lower Extremity Assessment Details Patient Name: Jacob [name], [name] Date of Service: 04/25/2015 4:00 PM Medical Record Number: [id number] Patient Account Number: [id number] Date of Birth/Sex: Sep 26, 1970 (44 y.o. Male) Treating RN: Baruch Gouty, RN, BSN, Velva Harman Primary Care Physician: Gayland Curry Other Clinician: Referring Physician: Gayland Curry Treating Physician/Extender: Benjaman Pott in Treatment: 6 Edema Assessment Assessed: [Left: No] [Right: No] E[Left: dema] [Right: :] Calf Left: Right: Point of Measurement: 37 cm From Medial Instep 48 cm cm Ankle Left: Right: Point of Measurement: 12 cm From Medial Instep 30 cm cm Vascular Assessment Claudication: Claudication Assessment [Left:None] Pulses: Posterior Tibial Dorsalis Pedis Palpable: [Left:Yes] Extremity colors, hair growth, and conditions: Extremity Color: [Left:Normal] Hair Growth on Extremity: [Left:Yes] Temperature of Extremity: [Left:Warm] Capillary Refill: [Left:< 3 seconds] Toe Nail Assessment Left: Right: Thick: No Discolored: No Deformed: No Improper Length and Hygiene: No Electronic Signature(s) Signed: 04/25/2015 5:06:20 PM By: Regan Lemming BSN, RN Entered By: Regan Lemming on 04/25/2015 16:02:18 [name] ([id number]) Jacob [name], Nina  AMarland Kitchen ([id number]) -------------------------------------------------------------------------------- Multi Wound Chart Details Patient Name: Jacob [name], [name] Date of Service: 04/25/2015 4:00 PM Medical Record Number: [id number] Patient Account Number: [id number] Date of Birth/Sex: 02-02-1971 (44 y.o. Male) Treating RN: Baruch Gouty, RN, BSN, Velva Harman Primary Care Physician: Gayland Curry Other Clinician: Referring Physician: Gayland Curry Treating Physician/Extender: Benjaman Pott in Treatment: 6 Vital Signs Height(in): 72 Pulse(bpm): 67 Weight(lbs): 360 Blood Pressure 155/85 (mmHg): Body Mass Index(BMI): 49 Temperature(F): 98.3  Respiratory Rate 19 (breaths/min): Photos: [1:No Photos] [N/A:N/A] Wound Location: [1:Left Lower Leg - Lateral] [N/A:N/A] Wounding Event: [1:Surgical Injury] [N/A:N/A] Primary Etiology: [1:Open Surgical Wound] [N/A:N/A] Comorbid History: [1:Sleep Apnea, Hypertension, Type II Diabetes, Gout] [N/A:N/A] Date Acquired: [1:01/30/2015] [N/A:N/A] Weeks of Treatment: [1:6] [N/A:N/A] Wound Status: [1:Open] [N/A:N/A] Measurements L x W x D 1x1x0.1 [N/A:N/A] (cm) Area (cm) : [1:0.785] [N/A:N/A] Volume (cm) : [1:0.079] [N/A:N/A] % Reduction in Area: [1:72.20%] [N/A:N/A] % Reduction in Volume: 72.10% [N/A:N/A] Classification: [1:Full Thickness Without Exposed Support Structures] [N/A:N/A] HBO Classification: [1:Grade 1] [N/A:N/A] Exudate Amount: [1:Large] [N/A:N/A] Exudate Type: [1:Serosanguineous] [N/A:N/A] Exudate Color: [1:red, brown] [N/A:N/A] Wound Margin: [1:Flat and Intact] [N/A:N/A] Granulation Amount: [1:Large (67-100%)] [N/A:N/A] Granulation Quality: [1:Red] [N/A:N/A] Necrotic Amount: [1:Small (1-33%)] [N/A:N/A] Exposed Structures: [1:Fascia: No Fat: No Tendon: No] [N/A:N/A] Muscle: No Joint: No Bone: No Limited to Skin Breakdown Epithelialization: None N/A N/A Periwound Skin Texture: Edema: Yes N/A N/A Excoriation: No Induration:  No Callus: No Crepitus: No Fluctuance: No Friable: No Rash: No Scarring: No Periwound Skin Moist: Yes N/A N/A Moisture: Maceration: No Dry/Scaly: No Periwound Skin Color: Erythema: Yes N/A N/A Atrophie Blanche: No Cyanosis: No Ecchymosis: No Hemosiderin Staining: No Mottled: No Pallor: No Rubor: No Erythema Location: Circumferential N/A N/A Temperature: No Abnormality N/A N/A Tenderness on Yes N/A N/A Palpation: Wound Preparation: Ulcer Cleansing: N/A N/A Rinsed/Irrigated with Saline Topical Anesthetic Applied: Other: lidocaine 4% Treatment Notes Electronic Signature(s) Signed: 04/25/2015 4:10:45 PM By: Regan Lemming BSN, RN Entered By: Regan Lemming on 04/25/2015 16:10:45 [name] ([id number]) -------------------------------------------------------------------------------- [place] Details Patient Name: Jacob [name], Alixander A. Date of Service: 04/25/2015 4:00 PM Medical Record Number: [id number] Patient Account Number: [id number] Date of Birth/Sex: 06-Dec-1970 (44 y.o. Male) Treating RN: Baruch Gouty, RN, BSN, Velva Harman Primary Care Physician: Gayland Curry Other Clinician: Referring Physician: Gayland Curry Treating Physician/Extender: Benjaman Pott in Treatment: 6 Active Inactive Orientation to the Wound Care Program Nursing Diagnoses: Knowledge deficit related to the wound healing center program Goals: Patient/caregiver will verbalize understanding of the [place] Program Date Initiated: 03/14/2015 Goal Status: Active Interventions: Provide education on orientation to the wound center Notes: Wound/Skin Impairment Nursing Diagnoses: Impaired tissue integrity Goals: Patient/caregiver will verbalize understanding of skin care regimen Date Initiated: 03/14/2015 Goal Status: Active Ulcer/skin breakdown will have a volume reduction of 30% by week 4 Date Initiated: 03/14/2015 Goal Status: Active Ulcer/skin breakdown  will have a volume reduction of 50% by week 8 Date Initiated: 03/14/2015 Goal Status: Active Ulcer/skin breakdown will have a volume reduction of 80% by week 12 Date Initiated: 03/14/2015 Goal Status: Active Ulcer/skin breakdown will heal within 14 weeks Date Initiated: 03/14/2015 Goal Status: Active Jacob [name], Jacob [name] ([id number]) Interventions: Assess patient/caregiver ability to perform ulcer/skin care regimen upon admission and as needed Assess ulceration(s) every visit Notes: Electronic Signature(s) Signed: 04/25/2015 4:10:38 PM By: Regan Lemming BSN, RN Entered By: Regan Lemming on 04/25/2015 16:10:38 [name] ([id number]) -------------------------------------------------------------------------------- Pain Assessment Details Patient Name: Jacob [name], Jettson A. Date of Service: 04/25/2015 4:00 PM Medical Record Number: [id number] Patient Account Number: [id number] Date of Birth/Sex: 02/24/1971 (44 y.o. Male) Treating RN: Baruch Gouty, RN, BSN, Velva Harman Primary Care Physician: Gayland Curry Other Clinician: Referring Physician: Gayland Curry Treating Physician/Extender: Benjaman Pott in Treatment: 6 Active Problems Location of Pain Severity and Description of Pain Patient Has Paino No Site Locations Pain Management and Medication Current Pain Management: Electronic Signature(s) Signed: 04/25/2015 5:06:20 PM By: Regan Lemming BSN, RN Entered By: Regan Lemming on 04/25/2015 15:53:34 Jacob [name], Demonta A. ([id number]) --------------------------------------------------------------------------------  Patient/Caregiver Education Details Patient Name: Jacob [name], [name] Date of Service: 04/25/2015 4:00 PM Medical Record Number: [id number] Patient Account Number: [id number] Date of Birth/Gender: 1971/04/22 (44 y.o. Male) Treating RN: Baruch Gouty, RN, BSN, Velva Harman Primary Care Physician: Gayland Curry Other Clinician: Referring Physician: Gayland Curry Treating  Physician/Extender: Benjaman Pott in Treatment: 6 Education Assessment Education Provided To: Patient Education Topics Provided Welcome To [facility]: Methods: Explain/Verbal Responses: State content correctly Electronic Signature(s) Signed: 04/25/2015 4:22:05 PM By: Judene Companion MD Previous Signature: 04/25/2015 4:13:50 PM Version By: Regan Lemming BSN, RN Entered By: Judene Companion on 04/25/2015 16:22:05 [name] ([id number]) -------------------------------------------------------------------------------- Wound Assessment Details Patient Name: Jacob [name], [name] Date of Service: 04/25/2015 4:00 PM Medical Record Number: [id number] Patient Account Number: [id number] Date of Birth/Sex: 10-24-1970 (44 y.o. Male) Treating RN: Baruch Gouty, RN, BSN, [place] Primary Care Physician: Gayland Curry Other Clinician: Referring Physician: Gayland Curry Treating Physician/Extender: Benjaman Pott in Treatment: 6 Wound Status Wound Number: 1 Primary Open Surgical Wound Etiology: Wound Location: Left Lower Leg - Lateral Wound Status: Open Wounding Event: Surgical Injury Comorbid Sleep Apnea, Hypertension, Type II Date Acquired: 01/30/2015 History: Diabetes, Gout Weeks Of Treatment: 6 Clustered Wound: No Photos Photo Uploaded By: Regan Lemming on 04/25/2015 16:38:39 Wound Measurements Length: (cm) 1 Width: (cm) 1 Depth: (cm) 0.1 Area: (cm) 0.785 Volume: (cm) 0.079 % Reduction in Area: 72.2% % Reduction in Volume: 72.1% Epithelialization: None Tunneling: No Undermining: No Wound Description Full Thickness Without Exposed Foul Odor Af Classification: Support Structures Diabetic Severity Grade 1 (Wagner): Wound Margin: Flat and Intact Plato, Arick A. ([id number]) ter Cleansing: No Exudate Amount: Large Exudate Type: Serosanguineous Exudate Color: red, brown Wound Bed Granulation Amount: Large (67-100%) Exposed Structure Granulation  Quality: Red Fascia Exposed: No Necrotic Amount: Small (1-33%) Fat Layer Exposed: No Necrotic Quality: Adherent Slough Tendon Exposed: No Muscle Exposed: No Joint Exposed: No Bone Exposed: No Limited to Skin Breakdown Periwound Skin Texture Texture Color No Abnormalities Noted: No No Abnormalities Noted: No Callus: No Atrophie Blanche: No Crepitus: No Cyanosis: No Excoriation: No Ecchymosis: No Fluctuance: No Erythema: Yes Friable: No Erythema Location: Circumferential Induration: No Hemosiderin Staining: No Localized Edema: Yes Mottled: No Rash: No Pallor: No Scarring: No Rubor: No Moisture Temperature / Pain No Abnormalities Noted: No Temperature: No Abnormality Dry / Scaly: No Tenderness on Palpation: Yes Maceration: No Moist: Yes Wound Preparation Ulcer Cleansing: Rinsed/Irrigated with Saline Topical Anesthetic Applied: Other: lidocaine 4%, Treatment Notes Wound #1 (Left, Lateral Lower Leg) 1. Cleansed with: Clean wound with Normal Saline 4. Dressing Applied: Prisma Ag 5. Secondary Dressing Applied Bordered Foam Dressing 7. Secured with Patient to wear own compression stockings Jacob [name], Jacob [name] ([id number]) Electronic Signature(s) Signed: 04/25/2015 4:10:29 PM By: Regan Lemming BSN, RN Entered By: Regan Lemming on 04/25/2015 16:10:29 [name] ([id number]) -------------------------------------------------------------------------------- Vitals Details Patient Name: Jacob [name], Hason A. Date of Service: 04/25/2015 4:00 PM Medical Record Number: [id number] Patient Account Number: [id number] Date of Birth/Sex: 1970-12-25 (44 y.o. Male) Treating RN: Baruch Gouty, RN, BSN, [place] Primary Care Physician: Gayland Curry Other Clinician: Referring Physician: Gayland Curry Treating Physician/Extender: Benjaman Pott in Treatment: 6 Vital Signs Time Taken: 15:54 Temperature (F): 98.3 Height (in): 72 Pulse (bpm): 67 Weight (lbs):  360 Respiratory Rate (breaths/min): 19 Body Mass Index (BMI): 48.8 Blood Pressure (mmHg): 155/85 Reference Range: 80 - 120 mg / dl Electronic Signature(s) Signed: 04/25/2015 5:06:20 PM By: Regan Lemming BSN, RN Entered By: Regan Lemming on 04/25/2015 15:54:10

## 2015-04-27 NOTE — Progress Notes (Signed)
NATAVION, DUNNETT ([id number]) Visit Report for 04/25/2015 Chief Complaint Document Details Patient Name: Jacob [name], [name] Date of Service: 04/25/2015 4:00 PM Medical Record Number: [id number] Patient Account Number: [id number] Date of Birth/Sex: April 04, 1971 (44 y.o. Male) Treating RN: Baruch Gouty, RN, BSN, Velva Harman Primary Care Physician: Gayland Curry Other Clinician: Referring Physician: Gayland Curry Treating Physician/Extender: Benjaman Pott in Treatment: 6 Information Obtained from: Patient Chief Complaint Patient presents to the wound care center for a consult due non healing wound of the left lower extremity which he has had for about a month and a half. Electronic Signature(s) Signed: 04/25/2015 4:17:54 PM By: Judene Companion MD Entered By: Judene Companion on 04/25/2015 16:17:54 [name] ([id number]) -------------------------------------------------------------------------------- HPI Details Patient Name: Jacob [name], [name] Date of Service: 04/25/2015 4:00 PM Medical Record Number: [id number] Patient Account Number: [id number] Date of Birth/Sex: 1970/12/10 (44 y.o. Male) Treating RN: Baruch Gouty, RN, BSN, Velva Harman Primary Care Physician: Gayland Curry Other Clinician: Referring Physician: Gayland Curry Treating Physician/Extender: Benjaman Pott in Treatment: 6 History of Present Illness Location: left lower extremity laterally Quality: Patient reports experiencing a dull pain to affected area(s). Severity: Patient states wound (s) are getting better. Duration: Patient has had the wound for < 6 weeks prior to presenting for treatment Timing: Pain in wound is Intermittent (comes and goes Context: The wound occurred when the patient had a shave biopsy of a lesion on that leg which was benign Modifying Factors: Other treatment(s) tried include:was put on Keflex and Bactrim by his PCP Associated Signs and Symptoms: Patient reports presence of swelling HPI  Description: Pleasant 44 year old gentleman who had a shave biopsy done from his left lower extremity on October 3 and the wound has not healed since then. This was a benign disease and he was concerned about the length of time it has taken to heal. He has past medical history of diabetes mellitus, gout, hypertension, sleep apnea and is status post tonsillectomy and adenoidectomy, cholecystectomy and uvulopalatopharyngoplasty. he was recently seen by his PCP Dr. Jodi Mourning who had already given him Keflex for a week and then put him on Bactrim for 10 days. the patient was a smoker before and quit in 1995 but currently chews tobacco on a daily basis. Electronic Signature(s) Signed: 04/25/2015 4:18:32 PM By: Judene Companion MD Entered By: Judene Companion on 04/25/2015 16:18:32 [name] ([id number]) -------------------------------------------------------------------------------- Physical Exam Details Patient Name: Jacob [name], [name] Date of Service: 04/25/2015 4:00 PM Medical Record Number: [id number] Patient Account Number: [id number] Date of Birth/Sex: 02-Dec-1970 (44 y.o. Male) Treating RN: Baruch Gouty, RN, BSN, Velva Harman Primary Care Physician: Gayland Curry Other Clinician: Referring Physician: Gayland Curry Treating Physician/Extender: Benjaman Pott in Treatment: 6 Electronic Signature(s) Signed: 04/25/2015 4:18:44 PM By: Judene Companion MD Entered By: Judene Companion on 04/25/2015 16:18:44 [name] ([id number]) -------------------------------------------------------------------------------- Physician Orders Details Patient Name: Jacob [name], [name] Date of Service: 04/25/2015 4:00 PM Medical Record Number: [id number] Patient Account Number: [id number] Date of Birth/Sex: 06-03-1970 (44 y.o. Male) Treating RN: Baruch Gouty, RN, BSN, Velva Harman Primary Care Physician: Gayland Curry Other Clinician: Referring Physician: Gayland Curry Treating Physician/Extender: Benjaman Pott in Treatment: 6 Verbal / Phone Orders: Yes Clinician: Afful, RN, BSN, Rita Read Back and Verified: Yes Diagnosis Coding Wound Cleansing Wound #1 Left,Lateral Lower Leg o Clean wound with Normal Saline. Anesthetic Wound #1 Left,Lateral Lower Leg o Topical Lidocaine 4% cream applied to wound bed prior to debridement Primary Wound Dressing Wound #1 Left,Lateral Lower Leg o Prisma Ag Secondary Dressing Wound #1 Left,Lateral  Lower Leg o Boardered Foam Dressing Dressing Change Frequency Wound #1 Left,Lateral Lower Leg o Change dressing every other day. Follow-up Appointments Wound #1 Left,Lateral Lower Leg o Return Appointment in 1 week. Edema Control Wound #1 Left,Lateral Lower Leg o Patient to wear own compression stockings o Elevate legs to the level of the heart and pump ankles as often as possible Electronic Signature(s) Signed: 04/25/2015 4:11:48 PM By: Regan Lemming BSN, RN Signed: 04/27/2015 8:37:21 AM By: Judene Companion MD Entered By: Regan Lemming on 04/25/2015 16:11:48 [name] ([id number]) Jacob [name], Tyrail AMarland Kitchen ([id number]) -------------------------------------------------------------------------------- Problem List Details Patient Name: Jacob [name], [name] Date of Service: 04/25/2015 4:00 PM Medical Record Number: [id number] Patient Account Number: [id number] Date of Birth/Sex: 06/12/1970 (44 y.o. 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 Date of Service: 04/25/2015 4:00 PM Medical Record Number: [id number] Patient Account Number: [id number] Date of Birth/Sex: [birth year] (44 y.o. Male) Treating RN: Baruch Gouty, RN, BSN, Velva Harman Primary Care Physician: Gayland Curry Other Clinician: Referring Physician: Gayland Curry Treating Physician/Extender: Benjaman Pott in Treatment: 6 Subjective Chief Complaint Information obtained from Patient Patient presents to the wound care center for a consult due non healing wound of the left lower extremity which he has had for about a month and a half. History of Present Illness (HPI) The following HPI elements were documented for the patient's wound: Location: left lower extremity laterally Quality: Patient reports experiencing a dull pain to affected area(s). Severity: Patient states wound (s) are getting better. Duration: Patient has had the wound for < 6 weeks prior to presenting for treatment Timing: Pain in wound is Intermittent (comes and goes Context: The wound occurred when the patient had a shave biopsy of a lesion on that leg which was benign Modifying Factors: Other treatment(s) tried include:was put on Keflex and Bactrim by his PCP Associated Signs and Symptoms: Patient reports presence of swelling Pleasant 44 year old gentleman who had a shave biopsy done from his left lower extremity on October 3 and the wound has not healed since then. This was a benign disease  and he was concerned about the length of time it has taken to heal. He has past medical history of diabetes mellitus, gout, hypertension, sleep apnea and is status post tonsillectomy and adenoidectomy, cholecystectomy and uvulopalatopharyngoplasty. he was recently seen by his PCP Dr. Jodi Mourning who had already given him Keflex for a week and then put him on Bactrim for 10 days. the patient was a smoker before and quit in 1995 but currently chews tobacco on a daily basis. Objective Constitutional Vitals Time Taken: 3:54 PM, Height: 72 in, Weight: 360 lbs, BMI: 48.8, Temperature: 98.3 F, Pulse: 67 bpm, Respiratory Rate: 19 breaths/min, Blood Pressure: 155/85 mmHg. Integumentary (Hair, Skin) Mitro, Kasean A. ([id number]) Wound #1 status is Open. Original cause of  wound was Surgical Injury. The wound is located on the Left,Lateral Lower Leg. The wound measures 1cm length x 1cm width x 0.1cm depth; 0.785cm^2 area and 0.079cm^3 volume. The wound is limited to skin breakdown. There is no tunneling or undermining noted. There is a large amount of serosanguineous drainage noted. The wound margin is flat and intact. There is large (67-100%) red granulation within the wound bed. There is a small (1-33%) amount of necrotic tissue within the wound bed including Adherent Slough. The periwound skin appearance exhibited: Localized Edema, Moist, Erythema. The periwound skin appearance did not exhibit: Callus, Crepitus, Excoriation, Fluctuance, Friable, Induration, Rash, Scarring, Dry/Scaly, Maceration, Atrophie Blanche, Cyanosis, Ecchymosis, Hemosiderin Staining, Mottled, Pallor, Rubor. The surrounding wound skin color is noted with erythema which is circumferential. Periwound temperature was noted as No Abnormality. The periwound has tenderness on palpation. Assessment Active Problems ICD-10 E11.622 - Type 2 diabetes mellitus with other skin ulcer T81.31XA - Disruption of external operation  (surgical) wound, not elsewhere classified, initial encounter L97.222 - Non-pressure chronic ulcer of left calf with fat layer exposed F17.228 - Nicotine dependence, chewing tobacco, with other nicotine-induced disorders E66.01 - Morbid (severe) obesity due to excess calories Plan Wound Cleansing: Wound #1 Left,Lateral Lower Leg: Clean wound with Normal Saline. Anesthetic: Wound #1 Left,Lateral Lower Leg: Topical Lidocaine 4% cream applied to wound bed prior to debridement Primary Wound Dressing: Wound #1 Left,Lateral Lower Leg: Prisma Ag Secondary Dressing: Wound #1 Left,Lateral Lower Leg: Boardered Foam Dressing Dressing Change Frequency: Wound #1 Left,Lateral Lower Leg: Change dressing every other day. Follow-up Appointments: Wound #1 Left,Lateral Lower Leg: Linnemann, Telford A. ([id number]) Return Appointment in 1 week. Edema Control: Wound #1 Left,Lateral Lower Leg: Patient to wear own compression stockings Elevate legs to the level of the heart and pump ankles as often as possible Non-healing surgical wound left leg. Also diabetic with diabetes. Obese Electronic Signature(s) Signed: 04/25/2015 4:20:33 PM By: Judene Companion MD Entered By: Judene Companion on 04/25/2015 16:20:33 [name] ([id number]) -------------------------------------------------------------------------------- [place] Details Patient Name: Jacob [name], Thang A. Date of Service: 04/25/2015 Medical Record Number: [id number] Patient Account Number: [id number] Date of Birth/Sex: 1970-10-02 (44 y.o. Male) Treating RN: Baruch Gouty, RN, BSN, [place] Primary Care Physician: Gayland Curry Other Clinician: Referring Physician: Gayland Curry Treating Physician/Extender: Benjaman Pott in Treatment: 6 Diagnosis Coding ICD-10 Codes Code Description E11.622 Type 2 diabetes mellitus with other skin ulcer Disruption of external operation (surgical) wound, not elsewhere classified,  initial T81.31XA encounter L97.222 Non-pressure chronic ulcer of left calf with fat layer exposed F17.228 Nicotine dependence, chewing tobacco, with other nicotine-induced disorders E66.01 Morbid (severe) obesity due to excess calories Facility Procedures CPT4 Code: [id number] Description: [id number] - WOUND CARE VISIT-LEV 2 EST PT Modifier: Quantity: 1 Physician Procedures CPT4: Description Modifier Quantity Code [id number] - WC PHYS LEVEL 2 - EST PT 1 ICD-10 Description Diagnosis T81.31XA Disruption of external operation (surgical) wound, not elsewhere classified, initial encounter Electronic Signature(s) Signed: 04/25/2015 4:21:07 PM By: Judene Companion MD Previous Signature: 04/25/2015 4:12:46 PM Version By: Regan Lemming BSN, RN Entered By: Judene Companion on 04/25/2015 16:21:07

## 2015-05-04 ENCOUNTER — Encounter: Payer: BC Managed Care – PPO | Attending: Surgery | Admitting: Surgery

## 2015-05-04 DIAGNOSIS — E1165 Type 2 diabetes mellitus with hyperglycemia: Secondary | ICD-10-CM | POA: Insufficient documentation

## 2015-05-04 DIAGNOSIS — Z9889 Other specified postprocedural states: Secondary | ICD-10-CM | POA: Insufficient documentation

## 2015-05-04 DIAGNOSIS — L97222 Non-pressure chronic ulcer of left calf with fat layer exposed: Secondary | ICD-10-CM | POA: Diagnosis not present

## 2015-05-04 DIAGNOSIS — I10 Essential (primary) hypertension: Secondary | ICD-10-CM | POA: Diagnosis not present

## 2015-05-04 DIAGNOSIS — F17200 Nicotine dependence, unspecified, uncomplicated: Secondary | ICD-10-CM | POA: Insufficient documentation

## 2015-05-04 DIAGNOSIS — E11622 Type 2 diabetes mellitus with other skin ulcer: Secondary | ICD-10-CM | POA: Insufficient documentation

## 2015-05-04 DIAGNOSIS — G4730 Sleep apnea, unspecified: Secondary | ICD-10-CM | POA: Diagnosis not present

## 2015-05-04 DIAGNOSIS — Z6841 Body Mass Index (BMI) 40.0 and over, adult: Secondary | ICD-10-CM | POA: Insufficient documentation

## 2015-05-04 DIAGNOSIS — M109 Gout, unspecified: Secondary | ICD-10-CM | POA: Insufficient documentation

## 2015-05-04 DIAGNOSIS — T8131XA Disruption of external operation (surgical) wound, not elsewhere classified, initial encounter: Secondary | ICD-10-CM | POA: Insufficient documentation

## 2015-05-05 NOTE — Progress Notes (Signed)
Jacob [name], Jacob [name] ([id number]) Visit Report for 05/04/2015 Arrival Information Details Patient Name: [name], [name] Date of Service: 05/04/2015 2:45 PM Medical Record Number: [id number] Patient Account Number: [id number] Date of Birth/Sex: Dec 29, 1970 (45 y.o. Male) Treating RN: Montey Hora Primary Care Physician: Gayland Curry Other Clinician: Referring Physician: Gayland Curry Treating Physician/Extender: Frann Rider in Treatment: 7 Visit Information History Since Last Visit Added or deleted any medications: No Patient Arrived: Ambulatory Any new allergies or adverse reactions: No Arrival Time: 14:49 Had a fall or experienced change in No Accompanied By: self activities of daily living that may affect Transfer Assistance: None risk of falls: Patient Identification Verified: Yes Signs or symptoms of abuse/neglect since last No Secondary Verification Process Yes visito Completed: Hospitalized since last visit: No Patient Requires Transmission-Based No Pain Present Now: No Precautions: Patient Has Alerts: Yes Patient Alerts: DMII Electronic Signature(s) Signed: 05/04/2015 5:56:46 PM By: Montey Hora Entered By: Montey Hora on 05/04/2015 14:49:27 [name] ([id number]) -------------------------------------------------------------------------------- Encounter Discharge Information Details Patient Name: Jacob Dee, [name] Date of Service: 05/04/2015 2:45 PM Medical Record Number: [id number] Patient Account Number: [id number] Date of Birth/Sex: 1971/04/09 (45 y.o. Male) Treating RN: Montey Hora Primary Care Physician: Gayland Curry Other Clinician: Referring Physician: Gayland Curry Treating Physician/Extender: Frann Rider in Treatment: 7 Encounter Discharge Information Items Discharge Pain Level: 0 Discharge Condition: Stable Ambulatory Status: Ambulatory Discharge Destination: Home Transportation: Private  Auto Accompanied By: self Schedule Follow-up Appointment: Yes Medication Reconciliation completed and provided to Patient/Care No Kaliope Quinonez: Provided on Clinical Summary of Care: 05/04/2015 Form Type Recipient Paper Patient [facility] Electronic Signature(s) Signed: 05/04/2015 3:32:37 PM By: Montey Hora Previous Signature: 05/04/2015 3:15:24 PM Version By: Ruthine Dose Entered By: Montey Hora on 05/04/2015 15:32:35 [name] ([id number]) -------------------------------------------------------------------------------- Lower Extremity Assessment Details Patient Name: Jacob Dee, [name] Date of Service: 05/04/2015 2:45 PM Medical Record Number: [id number] Patient Account Number: [id number] Date of Birth/Sex: 1970/09/05 (45 y.o. Male) Treating RN: Montey Hora Primary Care Physician: Gayland Curry Other Clinician: Referring Physician: Gayland Curry Treating Physician/Extender: Frann Rider in Treatment: 7 Edema Assessment Assessed: [Left: No] [Right: No] Edema: [Left: Ye] [Right: s] Calf Left: Right: Point of Measurement: 37 cm From Medial Instep 48 cm cm Ankle Left: Right: Point of Measurement: 12 cm From Medial Instep 30.5 cm cm Vascular Assessment Pulses: Posterior Tibial Dorsalis Pedis Palpable: [Left:Yes] Extremity colors, hair growth, and conditions: Extremity Color: [Left:Normal] Hair Growth on Extremity: [Left:Yes] Temperature of Extremity: [Left:Warm] Capillary Refill: [Left:< 3 seconds] Electronic Signature(s) Signed: 05/04/2015 5:56:46 PM By: Montey Hora Entered By: Montey Hora on 05/04/2015 14:56:48 [name], [name] ([id number]) -------------------------------------------------------------------------------- Multi Wound Chart Details Patient Name: Jacob Dee, [name] Date of Service: 05/04/2015 2:45 PM Medical Record Number: [id number] Patient Account Number: [id number] Date of Birth/Sex: [birth year] (45 y.o. Male) Treating RN: Montey Hora Primary Care Physician: Gayland Curry Other Clinician: Referring Physician: Gayland Curry Treating Physician/Extender: Frann Rider in Treatment: 7 Vital Signs Height(in): 72 Pulse(bpm): 55 Weight(lbs): 360 Blood Pressure 148/60 (mmHg): Body Mass Index(BMI): 49 Temperature(F): 98.5 Respiratory Rate 18 (breaths/min): Photos: [1:No Photos] [N/A:N/A] Wound Location: [1:Left Lower Leg - Lateral] [N/A:N/A] Wounding Event: [1:Surgical Injury] [N/A:N/A] Primary Etiology: [1:Open Surgical Wound] [N/A:N/A] Comorbid History: [1:Sleep Apnea, Hypertension, Type II Diabetes, Gout] [N/A:N/A] Date Acquired: [1:01/30/2015] [N/A:N/A] Weeks of Treatment: [1:7] [N/A:N/A] Wound Status: [1:Open] [N/A:N/A] Measurements L x W x D 0.4x0.5x0.1 [N/A:N/A] (cm) Area (cm) : [1:0.157] [N/A:N/A] Volume (cm) : [1:0.016] [N/A:N/A] % Reduction in Area: [1:94.40%] [N/A:N/A] % Reduction in Volume: 94.30% [  N/A:N/A] Classification: [1:Full Thickness Without Exposed Support Structures] [N/A:N/A] HBO Classification: [1:Grade 1] [N/A:N/A] Exudate Amount: [1:Large] [N/A:N/A] Exudate Type: [1:Serosanguineous] [N/A:N/A] Exudate Color: [1:red, brown] [N/A:N/A] Wound Margin: [1:Flat and Intact] [N/A:N/A] Granulation Amount: [1:Large (67-100%)] [N/A:N/A] Granulation Quality: [1:Red, Hyper-granulation] [N/A:N/A] Necrotic Amount: [1:None Present (0%)] [N/A:N/A] Exposed Structures: [1:Fascia: No Fat: No Tendon: No] [N/A:N/A] Muscle: No Joint: No Bone: No Limited to Skin Breakdown Epithelialization: None N/A N/A Periwound Skin Texture: Edema: Yes N/A N/A Excoriation: No Induration: No Callus: No Crepitus: No Fluctuance: No Friable: No Rash: No Scarring: No Periwound Skin Moist: Yes N/A N/A Moisture: Maceration: No Dry/Scaly: No Periwound Skin Color: Erythema: Yes N/A N/A Atrophie Blanche: No Cyanosis: No Ecchymosis: No Hemosiderin Staining: No Mottled: No Pallor: No Rubor:  No Erythema Location: Circumferential N/A N/A Temperature: No Abnormality N/A N/A Tenderness on Yes N/A N/A Palpation: Wound Preparation: Ulcer Cleansing: N/A N/A Rinsed/Irrigated with Saline Topical Anesthetic Applied: Other: lidocaine 4% Treatment Notes Electronic Signature(s) Signed: 05/04/2015 5:56:46 PM By: Montey Hora Entered By: Montey Hora on 05/04/2015 14:57:07 [name] ([id number]) -------------------------------------------------------------------------------- [place] Details Patient Name: Jacob Dee, [name] Date of Service: 05/04/2015 2:45 PM Medical Record Number: [id number] Patient Account Number: [id number] Date of Birth/Sex: January 04, 1971 (45 y.o. Male) Treating RN: Montey Hora Primary Care Physician: Gayland Curry Other Clinician: Referring Physician: Gayland Curry Treating Physician/Extender: Frann Rider in Treatment: 7 Active Inactive Orientation to the Wound Care Program Nursing Diagnoses: Knowledge deficit related to the wound healing center program Goals: Patient/caregiver will verbalize understanding of the [place] Program Date Initiated: 03/14/2015 Goal Status: Active Interventions: Provide education on orientation to the wound center Notes: Wound/Skin Impairment Nursing Diagnoses: Impaired tissue integrity Goals: Patient/caregiver will verbalize understanding of skin care regimen Date Initiated: 03/14/2015 Goal Status: Active Ulcer/skin breakdown will have a volume reduction of 30% by week 4 Date Initiated: 03/14/2015 Goal Status: Active Ulcer/skin breakdown will have a volume reduction of 50% by week 8 Date Initiated: 03/14/2015 Goal Status: Active Ulcer/skin breakdown will have a volume reduction of 80% by week 12 Date Initiated: 03/14/2015 Goal Status: Active Ulcer/skin breakdown will heal within 14 weeks Date Initiated: 03/14/2015 Goal Status: Active KELTON, KOZUB  ([id number]) Interventions: Assess patient/caregiver ability to perform ulcer/skin care regimen upon admission and as needed Assess ulceration(s) every visit Notes: Electronic Signature(s) Signed: 05/04/2015 5:56:46 PM By: Montey Hora Entered By: Montey Hora on 05/04/2015 14:56:59 [name] ([id number]) -------------------------------------------------------------------------------- Patient/Caregiver Education Details Patient Name: Jacob Dee, [name] Date of Service: 05/04/2015 2:45 PM Medical Record Number: [id number] Patient Account Number: [id number] Date of Birth/Gender: Dec 21, 1970 (45 y.o. Male) Treating RN: Montey Hora Primary Care Physician: Gayland Curry Other Clinician: Referring Physician: Gayland Curry Treating Physician/Extender: Frann Rider in Treatment: 7 Education Assessment Education Provided To: Patient Education Topics Provided Wound/Skin Impairment: Handouts: Other: wound care as ordered Methods: Demonstration, Explain/Verbal Responses: State content correctly Electronic Signature(s) Signed: 05/04/2015 3:32:58 PM By: Montey Hora Entered By: Montey Hora on 05/04/2015 15:32:58 [name] ([id number]) -------------------------------------------------------------------------------- Wound Assessment Details Patient Name: Jacob Dee, [name] Date of Service: 05/04/2015 2:45 PM Medical Record Number: [id number] Patient Account Number: [id number] Date of Birth/Sex: 1970-05-09 (45 y.o. Male) Treating RN: Montey Hora Primary Care Physician: Gayland Curry Other Clinician: Referring Physician: Gayland Curry Treating Physician/Extender: Frann Rider in Treatment: 7 Wound Status Wound Number: 1 Primary Open Surgical Wound Etiology: Wound Location: Left Lower Leg - Lateral Wound Status: Open Wounding Event: Surgical Injury Comorbid Sleep Apnea, Hypertension, Type II Date Acquired: 01/30/2015 History:  Diabetes, Gout Weeks Of Treatment: 7 Clustered Wound: No Photos Photo Uploaded By: Montey Hora on 05/04/2015 15:37:27 Wound Measurements Length: (cm) 0.4 Width: (cm) 0.5 Depth: (cm) 0.1 Area: (cm) 0.157 Volume: (cm) 0.016 % Reduction in Area: 94.4% % Reduction in Volume: 94.3% Epithelialization: None Tunneling: No Undermining: No Wound Description Full Thickness Without Exposed Foul Odor Af Classification: Support Structures Diabetic Severity Grade 1 (Wagner): Wound Margin: Flat and Intact Exudate Amount: Large Exudate Type: Serosanguineous Exudate Color: red, brown ter Cleansing: No Wound Bed Granulation Amount: Large (67-100%) Exposed Structure Granulation Quality: Red, Hyper-granulation Fascia Exposed: No Polyakov, [name] ([id number]) Necrotic Amount: None Present (0%) Fat Layer Exposed: No Tendon Exposed: No Muscle Exposed: No Joint Exposed: No Bone Exposed: No Limited to Skin Breakdown Periwound Skin Texture Texture Color No Abnormalities Noted: No No Abnormalities Noted: No Callus: No Atrophie Blanche: No Crepitus: No Cyanosis: No Excoriation: No Ecchymosis: No Fluctuance: No Erythema: Yes Friable: No Erythema Location: Circumferential Induration: No Hemosiderin Staining: No Localized Edema: Yes Mottled: No Rash: No Pallor: No Scarring: No Rubor: No Moisture Temperature / Pain No Abnormalities Noted: No Temperature: No Abnormality Dry / Scaly: No Tenderness on Palpation: Yes Maceration: No Moist: Yes Wound Preparation Ulcer Cleansing: Rinsed/Irrigated with Saline Topical Anesthetic Applied: Other: lidocaine 4%, Treatment Notes Wound #1 (Left, Lateral Lower Leg) 1. Cleansed with: Clean wound with Normal Saline 3. Peri-wound Care: Skin Prep 4. Dressing Applied: Aquacel Ag 5. Secondary Dressing Applied Bordered Foam Dressing 7. Secured with Support Garment 20-30 mm/Hg pressure to: Electronic Signature(s) Signed:  05/04/2015 5:56:46 PM By: Montey Hora Entered By: Montey Hora on 05/04/2015 14:55:57 [name] ([id number]) Jacob Dee, Jacob [name] AMarland Kitchen ([id number]) -------------------------------------------------------------------------------- [place] Details Patient Name: Jacob Dee, [name] Date of Service: 05/04/2015 2:45 PM Medical Record Number: [id number] Patient Account Number: [id number] Date of Birth/Sex: [birth year] (45 y.o. Male) Treating RN: Montey Hora Primary Care Physician: Gayland Curry Other Clinician: Referring Physician: Gayland Curry Treating Physician/Extender: Frann Rider in Treatment: 7 Vital Signs Time Taken: 14:50 Temperature (F): 98.5 Height (in): 72 Pulse (bpm): 55 Weight (lbs): 360 Respiratory Rate (breaths/min): 18 Body Mass Index (BMI): 48.8 Blood Pressure (mmHg): 148/60 Reference Range: 80 - 120 mg / dl Electronic Signature(s) Signed: 05/04/2015 5:56:46 PM By: Montey Hora Entered By: Montey Hora on 05/04/2015 14:52:31

## 2015-05-06 NOTE — Progress Notes (Signed)
TEJVEER, MOTA ([id number]) Visit Report for 05/04/2015 Chief Complaint Document Details Patient Name: Jacob [name], [name] Date of Service: 05/04/2015 2:45 PM Medical Record Number: [id number] Patient Account Number: [id number] Date of Birth/Sex: 12/18/1970 (45 y.o. Male) Treating RN: Jacob [name] Primary Care Physician: Jacob [name] Other Clinician: Referring Physician: Gayland [name] Treating Physician/Extender: Jacob [name] in Treatment: 7 Information Obtained from: Patient Chief Complaint Patient presents to the wound care center for a consult due non healing wound of the left lower extremity which he has had for about a month and a half. Electronic Signature(s) Signed: 05/04/2015 3:27:44 PM By: Jacob Fudge MD, FACS Entered By: Jacob [name] on 05/04/2015 15:27:44 Jacob [name] ([id number]) -------------------------------------------------------------------------------- HPI Details Patient Name: Jacob [name], [name] Date of Service: 05/04/2015 2:45 PM Medical Record Number: [id number] Patient Account Number: [id number] Date of Birth/Sex: 1970/07/13 (46 y.o. Male) Treating RN: Jacob [name] Primary Care Physician: Jacob [name] Other Clinician: Referring Physician: Gayland [name] Treating Physician/Extender: Jacob [name] in Treatment: 7 History of Present Illness Location: left lower extremity laterally Quality: Patient reports experiencing a dull pain to affected area(s). Severity: Patient states wound (s) are getting better. Duration: Patient has had the wound for < 6 weeks prior to presenting for treatment Timing: Pain in wound is Intermittent (comes and goes Context: The wound occurred when the patient had a shave biopsy of a lesion on that leg which was benign Modifying Factors: Other treatment(s) tried include:was put on Keflex and Bactrim by his PCP Associated Signs and Symptoms: Patient reports presence of swelling HPI Description:  Pleasant 45 year old gentleman who had a shave biopsy done from his left lower extremity on October 3 and the wound has not healed since then. This was a benign disease and he was concerned about the length of time it has taken to heal. He has past medical history of diabetes mellitus, gout, hypertension, sleep apnea and is status post tonsillectomy and adenoidectomy, cholecystectomy and uvulopalatopharyngoplasty. he was recently seen by his PCP Dr. Jodi [name] who had already given him Keflex for a week and then put him on Bactrim for 10 days. the patient was a smoker before and quit in 1995 but currently chews tobacco on a daily basis. 05/04/2015 -- he has been wearing compression stockings which we had ordered from a couple of weeks ago and has been doing his dressing as his advice and overall seems to be doing well Electronic Signature(s) Signed: 05/04/2015 3:28:09 PM By: Jacob Fudge MD, FACS Entered By: Jacob [name] on 05/04/2015 15:28:08 Jacob [name] AMarland Kitchen ([id number]) -------------------------------------------------------------------------------- [place] Details Patient Name: Jacob [name], [name] Date of Service: 05/04/2015 2:45 PM Medical Record Number: [id number] Patient Account Number: [id number] Date of Birth/Sex: 1970/09/18 (45 y.o. Male) Treating RN: Jacob [name] Primary Care Physician: Jacob [name] Other Clinician: Referring Physician: Gayland [name] Treating Physician/Extender: Jacob [name] in Treatment: 7 Procedure Performed for: Wound #1 Left,Lateral Lower Leg Performed By: Physician Jacob Fudge, MD Post Procedure Diagnosis Same as Pre-procedure Notes he has got significant amount of epithelization but towards the center he has got some hypergranulation tissue which needs silver nitrate stick cauterization. Electronic Signature(s) Signed: 05/04/2015 3:27:37 PM By: Jacob Fudge MD, FACS Previous Signature: 05/04/2015 3:27:16 PM  Version By: Jacob Fudge MD, FACS Entered By: Jacob [name] on 05/04/2015 15:27:37 Jacob [name] ([id number]) -------------------------------------------------------------------------------- Physical Exam Details Patient Name: Jacob [name], [name] Date of Service: 05/04/2015 2:45 PM Medical Record Number: [id number] Patient Account Number: [id number] Date of Birth/Sex: 07-Jul-1970 (45 y.o. Male) Treating  RN: Jacob [name] Primary Care Physician: Jacob [name] Other Clinician: Referring Physician: Gayland [name] Treating Physician/Extender: Jacob [name] in Treatment: 7 Constitutional . Pulse regular. Respirations normal and unlabored. Afebrile. . Eyes Nonicteric. Reactive to light. Ears, Nose, Mouth, and Throat Lips, teeth, and gums WNL.Marland Kitchen Moist mucosa without lesions. Neck supple and nontender. No palpable supraclavicular or cervical adenopathy. Normal sized without goiter. Respiratory WNL. No retractions.. Cardiovascular Pedal Pulses WNL. No clubbing, cyanosis or edema. Lymphatic No adneopathy. No adenopathy. No adenopathy. Musculoskeletal Adexa without tenderness or enlargement.. Digits and nails w/o clubbing, cyanosis, infection, petechiae, ischemia, or inflammatory conditions.. Integumentary (Hair, Skin) No suspicious lesions. No crepitus or fluctuance. No peri-wound warmth or erythema. No masses.Marland Kitchen Psychiatric Judgement and insight Intact.. No evidence of depression, anxiety, or agitation.. Notes the wound has some hyper granulation tissue towards the center and I will cauterize this with a silver nitrate stick. Electronic Signature(s) Signed: 05/04/2015 3:28:55 PM By: Jacob Fudge MD, FACS Entered By: Jacob [name] on 05/04/2015 15:28:54 Jacob [name] ([id number]) -------------------------------------------------------------------------------- Physician Orders Details Patient Name: Jacob [name], [name] Date of Service: 05/04/2015 2:45 PM Medical  Record Number: [id number] Patient Account Number: [id number] Date of Birth/Sex: 08-12-1970 (45 y.o. Male) Treating RN: Jacob [name] Primary Care Physician: Jacob [name] Other Clinician: Referring Physician: Gayland [name] Treating Physician/Extender: Jacob [name] in Treatment: 7 Verbal / Phone Orders: Yes Clinician: Montey [name] Read Back and Verified: Yes Diagnosis Coding Wound Cleansing Wound #1 Left,Lateral Lower Leg o Clean wound with Normal Saline. Anesthetic Wound #1 Left,Lateral Lower Leg o Topical Lidocaine 4% cream applied to wound bed prior to debridement Primary Wound Dressing Wound #1 Left,Lateral Lower Leg o Aquacel Ag Secondary Dressing Wound #1 Left,Lateral Lower Leg o Boardered Foam Dressing Dressing Change Frequency Wound #1 Left,Lateral Lower Leg o Change dressing every other day. Follow-up Appointments Wound #1 Left,Lateral Lower Leg o Return Appointment in 1 week. Edema Control Wound #1 Left,Lateral Lower Leg o Elevate legs to the level of the heart and pump ankles as often as possible o Support Garment 20-30 mm/Hg pressure to: - get compression hose at local drug store Electronic Signature(s) Signed: 05/04/2015 5:56:46 PM By: Jacob [name] Signed: 05/05/2015 5:05:49 PM By: Jacob Fudge MD, FACS Entered By: Jacob [name] on 05/04/2015 15:10:09 Jacob [name] ([id number]) Jacob [name], Jacob AMarland Kitchen ([id number]) -------------------------------------------------------------------------------- Problem List Details Patient Name: Jacob [name], [name] Date of Service: 05/04/2015 2:45 PM Medical Record Number: [id number] Patient Account Number: [id number] Date of Birth/Sex: [birth year] (45 y.o. 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 Date of Service: 05/04/2015 2:45 PM Medical Record Number: [id number] Patient Account Number: [id number] Date of Birth/Sex: December 12, 1970 (45 y.o. Male) Treating RN: Jacob [name] Primary Care Physician: Jacob [name] Other Clinician: Referring Physician: Gayland [name] Treating Physician/Extender: Jacob [name] in Treatment: 7 Subjective Chief Complaint Information  obtained from Patient Patient presents to the wound care center for a consult due non healing wound of the left lower extremity which he has had for about a month and a half. History of Present Illness (HPI) The following HPI elements were documented for the patient's wound: Location: left lower extremity laterally Quality: Patient reports experiencing a dull pain to affected area(s). Severity: Patient states wound (s) are getting better. Duration: Patient has had the wound for < 6 weeks prior to presenting for treatment Timing: Pain in wound is Intermittent (comes and goes Context: The wound occurred when the patient had a shave biopsy of a lesion on that leg which was benign Modifying Factors: Other  treatment(s) tried include:was put on Keflex and Bactrim by his PCP Associated Signs and Symptoms: Patient reports presence of swelling Pleasant 45 year old gentleman who had a shave biopsy done from his left lower extremity on October 3 and the wound has not healed since then. This was a benign disease and he was concerned about the length of time it has taken to heal. He has past medical history of diabetes mellitus, gout, hypertension, sleep apnea and is status post tonsillectomy and adenoidectomy, cholecystectomy and uvulopalatopharyngoplasty. he was recently seen by his PCP Dr. Jodi [name] who had already given him Keflex for a week and then put him on Bactrim for 10 days. the patient was a smoker before and quit in 1995 but currently chews tobacco on a daily basis. 05/04/2015 -- he has been wearing compression stockings which we had ordered from a couple of weeks ago and has been doing his dressing as his advice and overall seems to be doing well Objective Constitutional Pulse regular. Respirations normal and unlabored. Afebrile. Jacob [name], Jacob AMarland Kitchen ([id number]) Vitals Time Taken: 2:50 PM, Height: 72 in, Weight: 360 lbs, BMI: 48.8, Temperature: 98.5 F, Pulse: 55 bpm, Respiratory Rate: 18 breaths/min, Blood Pressure: 148/60 mmHg. Eyes Nonicteric. Reactive to light. Ears, Nose, Mouth, and Throat Lips, teeth, and gums WNL.Marland Kitchen Moist mucosa without lesions. Neck supple and nontender. No palpable supraclavicular or cervical adenopathy. Normal sized without goiter. Respiratory WNL. No retractions.. Cardiovascular Pedal Pulses WNL. No clubbing, cyanosis or edema. Lymphatic No adneopathy. No adenopathy. No adenopathy. Musculoskeletal Adexa without tenderness or enlargement.. Digits and nails w/o clubbing, cyanosis, infection, petechiae, ischemia, or inflammatory conditions.Marland Kitchen Psychiatric Judgement and insight Intact.. No evidence of depression, anxiety, or agitation.. General Notes:  the wound has some hyper granulation tissue towards the center and I will cauterize this with a silver nitrate stick. Integumentary (Hair, Skin) No suspicious lesions. No crepitus or fluctuance. No peri-wound warmth or erythema. No masses.. Wound #1 status is Open. Original cause of wound was Surgical Injury. The wound is located on the Left,Lateral Lower Leg. The wound measures 0.4cm length x 0.5cm width x 0.1cm depth; 0.157cm^2 area and 0.016cm^3 volume. The wound is limited to skin breakdown. There is no tunneling or undermining noted. There is a large amount of serosanguineous drainage noted. The wound margin is flat and intact. There is large (67-100%) red granulation within the wound bed. There is no necrotic tissue within the wound bed. The periwound skin appearance exhibited: Localized Edema, Moist, Erythema. The periwound skin appearance did not exhibit: Callus, Crepitus, Excoriation, Fluctuance, Friable, Induration, Rash, Scarring, Dry/Scaly, Maceration, Atrophie Blanche, Cyanosis, Ecchymosis, Hemosiderin Staining, Mottled, Pallor, Rubor. The surrounding wound skin color is noted with erythema which is circumferential. Periwound temperature was noted as No Abnormality. The periwound has tenderness on palpation. Assessment Jacob [name], [name] 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 I anticipate closure of this wound soon. Procedures Wound #1 Wound #1  is an Open Surgical Wound located on the Left,Lateral Lower Leg . An CHEM CAUT GRANULATION TISS procedure was performed by Jacob Fudge, MD. Post procedure Diagnosis Wound #1: Same as Pre-Procedure Notes: he has got significant amount of epithelization but towards the center he has got some hypergranulation tissue which needs silver nitrate stick cauterization. Plan Wound Cleansing: Wound #1 Left,Lateral Lower Leg: Clean wound with Normal Saline. Anesthetic: Wound #1 Left,Lateral Lower Leg: Topical Lidocaine 4% cream applied to wound bed prior to debridement Primary Wound Dressing: Wound #1 Left,Lateral Lower Leg: Aquacel Ag Secondary Dressing: Wound #1 Left,Lateral Lower Leg: Boardered Foam Dressing Dressing Change Frequency: Wound #1 Left,Lateral Lower Leg: Ra, Trason A. ([id number]) Change dressing every other day. Follow-up Appointments: Wound #1 Left,Lateral Lower Leg: Return Appointment in 1 week. Edema Control: Wound #1 Left,Lateral Lower Leg: Elevate legs to the level of the heart and pump ankles as often as possible Support Garment 20-30 mm/Hg pressure to: - get compression hose at local drug store I have recommended the patient continue with Aquacel Ag to be changed every other day and continue to use his compression stockings everyday for all his waking hours. I anticipate closure of this wound soon. Electronic Signature(s) Signed: 05/04/2015 3:29:46 PM By: Jacob Fudge MD, FACS Entered By: Jacob [name] on 05/04/2015 15:29:46 Jacob [name] ([id number]) -------------------------------------------------------------------------------- [place] Details Patient Name: Jacob [name], Domnick A. Date of Service: 05/04/2015 Medical Record Number: [id number] Patient Account Number: [id number] Date of Birth/Sex: October 14, 1970 (45 y.o. Male) Treating RN: Jacob [name] Primary Care Physician: Jacob [name] Other Clinician: Referring Physician: Gayland [name] Treating Physician/Extender: Jacob [name] in Treatment: 7 Diagnosis Coding ICD-10 Codes Code Description E11.622 Type 2 diabetes mellitus with other skin ulcer Disruption of external operation (surgical) wound, not elsewhere classified, initial T81.31XA encounter L97.222 Non-pressure chronic ulcer of left calf with fat layer exposed F17.228 Nicotine dependence, chewing tobacco, with other nicotine-induced disorders E66.01 Morbid (severe) obesity due to excess calories Facility Procedures CPT4: Description Modifier Quantity Code [id number] - CHEM CAUT GRANULATION TISS 1 ICD-10 Description Diagnosis E11.622 Type 2 diabetes mellitus with other skin ulcer T81.31XA Disruption of external operation (surgical) wound, not elsewhere  classified, initial encounter L97.222 Non-pressure chronic ulcer of left calf with fat layer exposed F17.228 Nicotine dependence, chewing tobacco, with other nicotine-induced disorders Physician Procedures CPT4: Description Modifier Quantity Code [id number] - WC PHYS CHEM CAUT GRAN TISSUE 1 ICD-10 Description Diagnosis E11.622 Type 2 diabetes mellitus with other skin ulcer T81.31XA Disruption of external operation (surgical) wound, not elsewhere  classified, initial encounter L97.222 Non-pressure chronic ulcer of left calf with fat layer exposed F17.228 Nicotine dependence, chewing tobacco, with other nicotine-induced disorders UMAIR, GRADDICK ([id number]) Electronic Signature(s) Signed: 05/04/2015 3:29:59 PM By: Jacob Fudge MD, FACS Entered By: Jacob [name] on 05/04/2015 15:29:59

## 2015-05-11 ENCOUNTER — Encounter: Payer: BC Managed Care – PPO | Admitting: Surgery

## 2015-05-11 DIAGNOSIS — E11622 Type 2 diabetes mellitus with other skin ulcer: Secondary | ICD-10-CM | POA: Diagnosis not present

## 2015-05-12 NOTE — Progress Notes (Signed)
Jacob, [name] ([id number]) Visit Report for 05/11/2015 Arrival Information Details Patient Name: Jacob [name], [name] Date of Service: 05/11/2015 1:30 PM Medical Record Number: [id number] Patient Account Number: [id number] Date of Birth/Sex: [birth year] (45 y.o. Male) Treating RN: Macarthur Critchley Primary Care Physician: Gayland Curry Other Clinician: Referring Physician: Gayland Curry Treating Physician/Extender: Frann Rider in Treatment: 8 Visit Information History Since Last Visit All ordered tests and consults were completed: No Patient Arrived: Ambulatory Added or deleted any medications: No Arrival Time: 13:31 Any new allergies or adverse reactions: No Accompanied By: self Had a fall or experienced change in No Transfer Assistance: None activities of daily living that may affect Patient Identification Verified: Yes risk of falls: Secondary Verification Process Yes Signs or symptoms of abuse/neglect since last No Completed: visito Patient Requires Transmission-Based No Hospitalized since last visit: No Precautions: Has Dressing in Place as Prescribed: Yes Patient Has Alerts: Yes Has Compression in Place as Prescribed: Yes Patient Alerts: DMII Pain Present Now: No Electronic Signature(s) Signed: 05/11/2015 5:20:37 PM By: Regan Lemming BSN, RN Entered By: Regan Lemming on 05/11/2015 13:47:43 [name] ([id number]) -------------------------------------------------------------------------------- Clinic Level of Care Assessment Details Patient Name: Jacob [name], Ameet A. Date of Service: 05/11/2015 1:30 PM Medical Record Number: [id number] Patient Account Number: [id number] Date of Birth/Sex: [birth year] (45 y.o. Male) Treating RN: Baruch Gouty, RN, BSN, [place] Primary Care Physician: Gayland Curry Other Clinician: Referring Physician: Gayland Curry Treating Physician/Extender: Frann Rider in Treatment: 8 Clinic Level of Care Assessment  Items TOOL 4 Quantity Score []  - Use when only an EandM is performed on FOLLOW-UP visit 0 ASSESSMENTS - Nursing Assessment / Reassessment X - Reassessment of Co-morbidities (includes updates in patient status) 1 10 X - Reassessment of Adherence to Treatment Plan 1 5 ASSESSMENTS - Wound and Skin Assessment / Reassessment X - Simple Wound Assessment / Reassessment - one wound 1 5 []  - Complex Wound Assessment / Reassessment - multiple wounds 0 []  - Dermatologic / Skin Assessment (not related to wound area) 0 ASSESSMENTS - Focused Assessment []  - Circumferential Edema Measurements - multi extremities 0 []  - Nutritional Assessment / Counseling / Intervention 0 X - Lower Extremity Assessment (monofilament, tuning fork, pulses) 1 5 []  - Peripheral Arterial Disease Assessment (using hand held doppler) 0 ASSESSMENTS - Ostomy and/or Continence Assessment and Care []  - Incontinence Assessment and Management 0 []  - Ostomy Care Assessment and Management (repouching, etc.) 0 PROCESS - Coordination of Care X - Simple Patient / Family Education for ongoing care 1 15 []  - Complex (extensive) Patient / Family Education for ongoing care 0 []  - Staff obtains Programmer, systems, Records, Test Results / Process Orders 0 []  - Staff telephones HHA, Nursing Homes / Clarify orders / etc 0 []  - Routine Transfer to another Facility (non-emergent condition) 0 Wik, Tawan A. ([id number]) []  - Routine Hospital Admission (non-emergent condition) 0 []  - New Admissions / Biomedical engineer / Ordering NPWT, Apligraf, etc. 0 []  - Emergency Hospital Admission (emergent condition) 0 []  - Simple Discharge Coordination 0 []  - Complex (extensive) Discharge Coordination 0 PROCESS - Special Needs []  - Pediatric / Minor Patient Management 0 []  - Isolation Patient Management 0 []  - Hearing / Language / Visual special needs 0 []  - Assessment of Community assistance (transportation, D/C planning, etc.) 0 []  - Additional  assistance / Altered mentation 0 []  - Support Surface(s) Assessment (bed, cushion, seat, etc.) 0 INTERVENTIONS - Wound Cleansing / Measurement X - Simple Wound Cleansing - one wound 1 5 []  -  Complex Wound Cleansing - multiple wounds 0 X - Wound Imaging (photographs - any number of wounds) 1 5 []  - Wound Tracing (instead of photographs) 0 X - Simple Wound Measurement - one wound 1 5 []  - Complex Wound Measurement - multiple wounds 0 INTERVENTIONS - Wound Dressings X - Small Wound Dressing one or multiple wounds 1 10 []  - Medium Wound Dressing one or multiple wounds 0 []  - Large Wound Dressing one or multiple wounds 0 []  - Application of Medications - topical 0 []  - Application of Medications - injection 0 INTERVENTIONS - Miscellaneous []  - External ear exam 0 Seefeld, Emersen A. ([id number]) []  - Specimen Collection (cultures, biopsies, blood, body fluids, etc.) 0 []  - Specimen(s) / Culture(s) sent or taken to Lab for analysis 0 []  - Patient Transfer (multiple staff / Harrel Lemon Lift / Similar devices) 0 []  - Simple Staple / Suture removal (25 or less) 0 []  - Complex Staple / Suture removal (26 or more) 0 []  - Hypo / Hyperglycemic Management (close monitor of Blood Glucose) 0 []  - Ankle / Brachial Index (ABI) - do not check if billed separately 0 X - Vital Signs 1 5 Has the patient been seen at the hospital within the last three years: Yes Total Score: 70 Level Of Care: New/Established - Level 2 Electronic Signature(s) Signed: 05/11/2015 5:20:37 PM By: Regan Lemming BSN, RN Entered By: Regan Lemming on 05/11/2015 13:45:11 [name] ([id number]) -------------------------------------------------------------------------------- Encounter Discharge Information Details Patient Name: Jacob [name], [name] Date of Service: 05/11/2015 1:30 PM Medical Record Number: [id number] Patient Account Number: [id number] Date of Birth/Sex: [birth year] (45 y.o. Male) Treating RN: Baruch Gouty, RN, BSN,  [place] Primary Care Physician: Gayland Curry Other Clinician: Referring Physician: Gayland Curry Treating Physician/Extender: Frann Rider in Treatment: 8 Encounter Discharge Information Items Discharge Pain Level: 0 Discharge Condition: Stable Ambulatory Status: Ambulatory Discharge Destination: Home Transportation: Private Auto Accompanied By: self Schedule Follow-up Appointment: No Medication Reconciliation completed and provided to Patient/Care No Shanika Levings: Provided on Clinical Summary of Care: 05/11/2015 Form Type Recipient Paper Patient [facility] Electronic Signature(s) Signed: 05/11/2015 1:48:08 PM By: Ruthine Dose Entered By: Ruthine Dose on 05/11/2015 13:48:08 [name] ([id number]) -------------------------------------------------------------------------------- Lower Extremity Assessment Details Patient Name: Jacob [name], Foday A. Date of Service: 05/11/2015 1:30 PM Medical Record Number: [id number] Patient Account Number: [id number] Date of Birth/Sex: 05-Oct-1970 (45 y.o. Male) Treating RN: Baruch Gouty, RN, BSN, [place] Primary Care Physician: Gayland Curry Other Clinician: Referring Physician: Gayland Curry Treating Physician/Extender: Frann Rider in Treatment: 8 Edema Assessment Assessed: [Left: No] [Right: No] Edema: [Left: N] [Right: o] Vascular Assessment Pulses: Posterior Tibial Dorsalis Pedis Palpable: [Left:Yes] Extremity colors, hair growth, and conditions: Extremity Color: [Left:Normal] Hair Growth on Extremity: [Left:Yes] Temperature of Extremity: [Left:Warm] Capillary Refill: [Left:< 3 seconds] Electronic Signature(s) Signed: 05/11/2015 5:20:37 PM By: Regan Lemming BSN, RN Entered By: Regan Lemming on 05/11/2015 13:35:16 [name] ([id number]) -------------------------------------------------------------------------------- Multi Wound Chart Details Patient Name: Jacob [name], Reis A. Date of Service: 05/11/2015 1:30  PM Medical Record Number: [id number] Patient Account Number: [id number] Date of Birth/Sex: Jan 14, 1971 (45 y.o. Male) Treating RN: Baruch Gouty, RN, BSN, [place] Primary Care Physician: Gayland Curry Other Clinician: Referring Physician: Gayland Curry Treating Physician/Extender: Frann Rider in Treatment: 8 Vital Signs Height(in): 72 Pulse(bpm): 56 Weight(lbs): 360 Blood Pressure 139/70 (mmHg): Body Mass Index(BMI): 49 Temperature(F): 98.0 Respiratory Rate (breaths/min): Photos: [1:No Photos] [N/A:N/A] Wound Location: [1:Left Lower Leg - Lateral] [N/A:N/A] Wounding Event: [1:Surgical Injury] [N/A:N/A] Primary Etiology: [1:Open Surgical Wound] [N/A:N/A] Comorbid History: [1:Sleep  Apnea, Hypertension, Type II Diabetes, Gout] [N/A:N/A] Date Acquired: [1:01/30/2015] [N/A:N/A] Weeks of Treatment: [1:8] [N/A:N/A] Wound Status: [1:Open] [N/A:N/A] Measurements L x W x D 0.4x0.2x0.1 [N/A:N/A] (cm) Area (cm) : [1:0.063] [N/A:N/A] Volume (cm) : [1:0.006] [N/A:N/A] % Reduction in Area: [1:97.80%] [N/A:N/A] % Reduction in Volume: 97.90% [N/A:N/A] Classification: [1:Full Thickness Without Exposed Support Structures] [N/A:N/A] HBO Classification: [1:Grade 1] [N/A:N/A] Exudate Amount: [1:Small] [N/A:N/A] Exudate Type: [1:Serosanguineous] [N/A:N/A] Exudate Color: [1:red, brown] [N/A:N/A] Wound Margin: [1:Flat and Intact] [N/A:N/A] Granulation Amount: [1:Large (67-100%)] [N/A:N/A] Granulation Quality: [1:Red, Hyper-granulation] [N/A:N/A] Necrotic Amount: [1:None Present (0%)] [N/A:N/A] Exposed Structures: [1:Fascia: No Fat: No Tendon: No] [N/A:N/A] Muscle: No Joint: No Bone: No Limited to Skin Breakdown Epithelialization: Small (1-33%) N/A N/A Periwound Skin Texture: Edema: No N/A N/A Excoriation: No Induration: No Callus: No Crepitus: No Fluctuance: No Friable: No Rash: No Scarring: No Periwound Skin Maceration: No N/A N/A Moisture: Moist: No Dry/Scaly:  No Periwound Skin Color: Atrophie Blanche: No N/A N/A Cyanosis: No Ecchymosis: No Erythema: No Hemosiderin Staining: No Mottled: No Pallor: No Rubor: No Temperature: No Abnormality N/A N/A Tenderness on Yes N/A N/A Palpation: Wound Preparation: Ulcer Cleansing: N/A N/A Rinsed/Irrigated with Saline Topical Anesthetic Applied: Other: lidocaine 4% Treatment Notes Electronic Signature(s) Signed: 05/11/2015 5:20:37 PM By: Regan Lemming BSN, RN Entered By: Regan Lemming on 05/11/2015 13:42:58 [name] ([id number]) -------------------------------------------------------------------------------- [place] Details Patient Name: Jacob [name], Lamount A. Date of Service: 05/11/2015 1:30 PM Medical Record Number: [id number] Patient Account Number: [id number] Date of Birth/Sex: [birth year] (45 y.o. Male) Treating RN: Baruch Gouty, RN, BSN, [place] Har[place] Primary Care Physician: Gayland Curry Other Clinician: Referring Physician: Gayland Curry Treating Physician/Extender: Frann Rider in Treatment: 8 Active Inactive Orientation to the Wound Care Program Nursing Diagnoses: Knowledge deficit related to the wound healing center program Goals: Patient/caregiver will verbalize understanding of the [place] Program Date Initiated: 03/14/2015 Goal Status: Active Interventions: Provide education on orientation to the wound center Notes: Wound/Skin Impairment Nursing Diagnoses: Impaired tissue integrity Goals: Patient/caregiver will verbalize understanding of skin care regimen Date Initiated: 03/14/2015 Goal Status: Active Ulcer/skin breakdown will have a volume reduction of 30% by week 4 Date Initiated: 03/14/2015 Goal Status: Active Ulcer/skin breakdown will have a volume reduction of 50% by week 8 Date Initiated: 03/14/2015 Goal Status: Active Ulcer/skin breakdown will have a volume reduction of 80% by week 12 Date Initiated: 03/14/2015 Goal Status:  Active Ulcer/skin breakdown will heal within 14 weeks Date Initiated: 03/14/2015 Goal Status: Active BERNARDO, JODOIN ([id number]) Interventions: Assess patient/caregiver ability to perform ulcer/skin care regimen upon admission and as needed Assess ulceration(s) every visit Notes: Electronic Signature(s) Signed: 05/11/2015 5:20:37 PM By: Regan Lemming BSN, RN Entered By: Regan Lemming on 05/11/2015 13:42:47 [name] ([id number]) -------------------------------------------------------------------------------- Pain Assessment Details Patient Name: Jacob [name], Rhys A. Date of Service: 05/11/2015 1:30 PM Medical Record Number: [id number] Patient Account Number: [id number] Date of Birth/Sex: November 30, 1970 (45 y.o. Male) Treating RN: Baruch Gouty, RN, BSN, [place] Harman Primary Care Physician: Gayland Curry Other Clinician: Referring Physician: Gayland Curry Treating Physician/Extender: Frann Rider in Treatment: 8 Active Problems Location of Pain Severity and Description of Pain Patient Has Paino No Site Locations Rate the pain. Current Pain Level: 0 Pain Management and Medication Current Pain Management: Electronic Signature(s) Signed: 05/11/2015 5:20:37 PM By: Regan Lemming BSN, RN Entered By: Regan Lemming on 05/11/2015 13:32:33 [name] ([id number]) -------------------------------------------------------------------------------- Patient/Caregiver Education Details Patient Name: Jacob [name], Amire A. Date of Service: 05/11/2015 1:30 PM Medical Record Number: [id number] Patient Account Number: [id number] Date of  Birth/Gender: 29-Mar-1971 (45 y.o. Male) Treating RN: Macarthur Critchley Primary Care Physician: Gayland Curry Other Clinician: Referring Physician: Gayland Curry Treating Physician/Extender: Frann Rider in Treatment: 8 Education Assessment Education Provided To: Patient Education Topics Provided Wound/Skin Impairment: Handouts: Caring for Your  Ulcer, Skin Care Do's and Dont's Methods: Explain/Verbal Responses: State content correctly Electronic Signature(s) Signed: 05/11/2015 4:39:39 PM By: Rebecca Eaton RN, Sendra Entered By: Rebecca Eaton RN, Sendra on 05/11/2015 13:39:28 [name] ([id number]) -------------------------------------------------------------------------------- Wound Assessment Details Patient Name: Jacob [name], Criss A. Date of Service: 05/11/2015 1:30 PM Medical Record Number: [id number] Patient Account Number: [id number] Date of Birth/Sex: [birth year] (45 y.o. Male) Treating RN: Baruch Gouty, RN, BSN, [place] Primary Care Physician: Gayland Curry Other Clinician: Referring Physician: Gayland Curry Treating Physician/Extender: Frann Rider in Treatment: 8 Wound Status Wound Number: 1 Primary Open Surgical Wound Etiology: Wound Location: Left Lower Leg - Lateral Wound Status: Open Wounding Event: Surgical Injury Comorbid Sleep Apnea, Hypertension, Type II Date Acquired: 01/30/2015 History: Diabetes, Gout Weeks Of Treatment: 8 Clustered Wound: No Photos Photo Uploaded By: Alric Quan on 05/11/2015 16:26:07 Wound Measurements Length: (cm) 0.4 Width: (cm) 0.2 Depth: (cm) 0.1 Area: (cm) 0.063 Volume: (cm) 0.006 % Reduction in Area: 97.8% % Reduction in Volume: 97.9% Epithelialization: Small (1-33%) Tunneling: No Undermining: No Wound Description Full Thickness Without Exposed Foul Odor A Classification: Support Structures Diabetic Severity Grade 1 (Wagner): Wound Margin: Flat and Intact Exudate Amount: Small Exudate Type: Serosanguineous Exudate Color: red, brown fter Cleansing: No Wound Bed Granulation Amount: Large (67-100%) Exposed Structure Granulation Quality: Red, Hyper-granulation Fascia Exposed: No Mckeel, Rubel A. ([id number]) Necrotic Amount: None Present (0%) Fat Layer Exposed: No Tendon Exposed: No Muscle Exposed: No Joint Exposed: No Bone Exposed:  No Limited to Skin Breakdown Periwound Skin Texture Texture Color No Abnormalities Noted: No No Abnormalities Noted: No Callus: No Atrophie Blanche: No Crepitus: No Cyanosis: No Excoriation: No Ecchymosis: No Fluctuance: No Erythema: No Friable: No Hemosiderin Staining: No Induration: No Mottled: No Localized Edema: No Pallor: No Rash: No Rubor: No Scarring: No Temperature / Pain Moisture Temperature: No Abnormality No Abnormalities Noted: No Tenderness on Palpation: Yes Dry / Scaly: No Maceration: No Moist: No Wound Preparation Ulcer Cleansing: Rinsed/Irrigated with Saline Topical Anesthetic Applied: Other: lidocaine 4%, Treatment Notes Wound #1 (Left, Lateral Lower Leg) 1. Cleansed with: Clean wound with Normal Saline 4. Dressing Applied: Aquacel Ag 5. Secondary Dressing Applied Bordered Foam Dressing 7. Secured with Patient to wear own compression stockings Electronic Signature(s) Signed: 05/11/2015 5:20:37 PM By: Regan Lemming BSN, RN Entered By: Regan Lemming on 05/11/2015 13:37:28 [name] ([id number]) -------------------------------------------------------------------------------- Vitals Details Patient Name: Jacob [name], Taggart A. Date of Service: 05/11/2015 1:30 PM Medical Record Number: [id number] Patient Account Number: [id number] Date of Birth/Sex: [birth year] (45 y.o. Male) Treating RN: Afful, RN, BSN, [place] Primary Care Physician: Gayland Curry Other Clinician: Referring Physician: Gayland Curry Treating Physician/Extender: Frann Rider in Treatment: 8 Vital Signs Time Taken: 13:32 Temperature (F): 98.0 Height (in): 72 Pulse (bpm): 56 Weight (lbs): 360 Blood Pressure (mmHg): 139/70 Body Mass Index (BMI): 48.8 Reference Range: 80 - 120 mg / dl Electronic Signature(s) Signed: 05/11/2015 5:20:37 PM By: Regan Lemming BSN, RN Entered By: Regan Lemming on 05/11/2015 13:33:59

## 2015-05-12 NOTE — Progress Notes (Signed)
ANGELGABRIEL, KRANE ([id number]) Visit Report for 05/11/2015 Chief Complaint Document Details Patient Name: Jacob [name], [name] Date of Service: 05/11/2015 1:30 PM Medical Record Number: [id number] Patient Account Number: [id number] Date of Birth/Sex: July 05, 1970 (45 y.o. Male) Treating RN: Macarthur Critchley Primary Care Physician: Gayland Curry Other Clinician: Referring Physician: Gayland Curry Treating Physician/Extender: Frann Rider in Treatment: 8 Information Obtained from: Patient Chief Complaint Patient presents to the wound care center for a consult due non healing wound of the left lower extremity which he has had for about a month and a half. Electronic Signature(s) Signed: 05/11/2015 1:45:46 PM By: Christin Fudge MD, FACS Entered By: Christin Fudge on 05/11/2015 13:45:46 [name] ([id number]) -------------------------------------------------------------------------------- HPI Details Patient Name: Jacob [name], [name] Date of Service: 05/11/2015 1:30 PM Medical Record Number: [id number] Patient Account Number: [id number] Date of Birth/Sex: [birth year] (45 y.o. Male) Treating RN: Macarthur Critchley Primary Care Physician: Gayland Curry Other Clinician: Referring Physician: Gayland Curry Treating Physician/Extender: Frann Rider in Treatment: 8 History of Present Illness Location: left lower extremity laterally Quality: Patient reports experiencing a dull pain to affected area(s). Severity: Patient states wound (s) are getting better. Duration: Patient has had the wound for < 6 weeks prior to presenting for treatment Timing: Pain in wound is Intermittent (comes and goes Context: The wound occurred when the patient had a shave biopsy of a lesion on that leg which was benign Modifying Factors: Other treatment(s) tried include:was put on Keflex and Bactrim by his PCP Associated Signs and Symptoms: Patient reports presence of swelling HPI  Description: Pleasant 45 year old gentleman who had a shave biopsy done from his left lower extremity on October 3 and the wound has not healed since then. This was a benign disease and he was concerned about the length of time it has taken to heal. He has past medical history of diabetes mellitus, gout, hypertension, sleep apnea and is status post tonsillectomy and adenoidectomy, cholecystectomy and uvulopalatopharyngoplasty. he was recently seen by his PCP Dr. Jodi Mourning who had already given him Keflex for a week and then put him on Bactrim for 10 days. the patient was a smoker before and quit in 1995 but currently chews tobacco on a daily basis. 05/04/2015 -- he has been wearing compression stockings which we had ordered from a couple of weeks ago and has been doing his dressing as his advice and overall seems to be doing well Electronic Signature(s) Signed: 05/11/2015 1:45:51 PM By: Christin Fudge MD, FACS Entered By: Christin Fudge on 05/11/2015 13:45:51 [name] ([id number]) -------------------------------------------------------------------------------- Physical Exam Details Patient Name: Jacob [name], [name] Date of Service: 05/11/2015 1:30 PM Medical Record Number: [id number] Patient Account Number: [id number] Date of Birth/Sex: [birth year] (45 y.o. Male) Treating RN: Macarthur Critchley Primary Care Physician: Gayland Curry Other Clinician: Referring Physician: Gayland Curry Treating Physician/Extender: Frann Rider in Treatment: 8 Constitutional . Pulse regular. Respirations normal and unlabored. Afebrile. . Eyes Nonicteric. Reactive to light. Ears, Nose, Mouth, and Throat Lips, teeth, and gums WNL.Marland Kitchen Moist mucosa without lesions. Neck supple and nontender. No palpable supraclavicular or cervical adenopathy. Normal sized without goiter. Respiratory WNL. No retractions.. Cardiovascular Pedal Pulses WNL. No clubbing, cyanosis or edema. Lymphatic No  adneopathy. No adenopathy. No adenopathy. Musculoskeletal Adexa without tenderness or enlargement.. Digits and nails w/o clubbing, cyanosis, infection, petechiae, ischemia, or inflammatory conditions.. Integumentary (Hair, Skin) No suspicious lesions. No crepitus or fluctuance. No peri-wound warmth or erythema. No masses.Marland Kitchen Psychiatric Judgement and insight Intact.. No evidence of depression, anxiety, or agitation.Marland Kitchen  Notes the hypergranulation tissue has gone down and the wound is looking excellent with a minimal area open. Electronic Signature(s) Signed: 05/11/2015 1:46:21 PM By: Christin Fudge MD, FACS Entered By: Christin Fudge on 05/11/2015 13:46:20 [name] ([id number]) -------------------------------------------------------------------------------- Physician Orders Details Patient Name: Jacob [name], [name] Date of Service: 05/11/2015 1:30 PM Medical Record Number: [id number] Patient Account Number: [id number] Date of Birth/Sex: August 01, 1970 (45 y.o. Male) Treating RN: Baruch Gouty, RN, BSN, Velva Harman Primary Care Physician: Gayland Curry Other Clinician: Referring Physician: Gayland Curry Treating Physician/Extender: Frann Rider in Treatment: 8 Verbal / Phone Orders: Yes Clinician: Afful, RN, BSN, Rita Read Back and Verified: Yes Diagnosis Coding Wound Cleansing Wound #1 Left,Lateral Lower Leg o Clean wound with Normal Saline. Anesthetic Wound #1 Left,Lateral Lower Leg o Topical Lidocaine 4% cream applied to wound bed prior to debridement Primary Wound Dressing Wound #1 Left,Lateral Lower Leg o Aquacel Ag Secondary Dressing Wound #1 Left,Lateral Lower Leg o Boardered Foam Dressing Dressing Change Frequency Wound #1 Left,Lateral Lower Leg o Change dressing every other day. Follow-up Appointments Wound #1 Left,Lateral Lower Leg o Return Appointment in 1 week. Edema Control Wound #1 Left,Lateral Lower Leg o Patient to wear own compression  stockings o Elevate legs to the level of the heart and pump ankles as often as possible Electronic Signature(s) Signed: 05/11/2015 4:38:48 PM By: Christin Fudge MD, FACS Signed: 05/11/2015 5:20:37 PM By: Regan Lemming BSN, RN Entered By: Regan Lemming on 05/11/2015 13:44:37 [name] ([id number]) Jacob [name], [name] ([id number]) -------------------------------------------------------------------------------- Problem List Details Patient Name: Jacob [name], [name] Date of Service: 05/11/2015 1:30 PM Medical Record Number: [id number] Patient Account Number: [id number] Date of Birth/Sex: 1970/09/06 (44 y.o. 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 Date of Service: 05/11/2015 1:30 PM Medical Record Number: [id number] Patient Account Number: [id number] Date of Birth/Sex: 1970-10-14 (45 y.o. Male) Treating RN: Macarthur Critchley Primary Care Physician: Gayland Curry Other  Clinician: Referring Physician: Gayland Curry Treating Physician/Extender: Frann Rider in Treatment: 8 Subjective Chief Complaint Information obtained from Patient Patient presents to the wound care center for a consult due non healing wound of the left lower extremity which he has had for about a month and a half. History of Present Illness (HPI) The following HPI elements were documented for the patient's wound: Location: left lower extremity laterally Quality: Patient reports experiencing a dull pain to affected area(s). Severity: Patient states wound (s) are getting better. Duration: Patient has had the wound for < 6 weeks prior to presenting for treatment Timing: Pain in wound is Intermittent (comes and goes Context: The wound occurred when the patient had a shave biopsy of a lesion on that leg which was benign Modifying Factors: Other treatment(s) tried include:was put on Keflex and Bactrim by his PCP Associated Signs and Symptoms: Patient reports presence of  swelling Pleasant 45 year old gentleman who had a shave biopsy done from his left lower extremity on October 3 and the wound has not healed since then. This was a benign disease and he was concerned about the length of time it has taken to heal. He has past medical history of diabetes mellitus, gout, hypertension, sleep apnea and is status post tonsillectomy and adenoidectomy, cholecystectomy and uvulopalatopharyngoplasty. he was recently seen by his PCP Dr. Jodi Mourning who had already given him Keflex for a week and then put him on Bactrim for 10 days. the patient was a smoker before and quit in 1995 but currently chews tobacco on a daily basis. 05/04/2015 -- he has been wearing compression stockings which we had ordered from a couple of weeks ago and has been doing his dressing as his advice and overall seems to be doing well Objective Constitutional Pulse regular. Respirations normal and unlabored.  Afebrile. Jacob [name], Jacob AMarland Kitchen ([id number]) Vitals Time Taken: 1:32 PM, Height: 72 in, Weight: 360 lbs, BMI: 48.8, Temperature: 98.0 F, Pulse: 56 bpm, Blood Pressure: 139/70 mmHg. Eyes Nonicteric. Reactive to light. Ears, Nose, Mouth, and Throat Lips, teeth, and gums WNL.Marland Kitchen Moist mucosa without lesions. Neck supple and nontender. No palpable supraclavicular or cervical adenopathy. Normal sized without goiter. Respiratory WNL. No retractions.. Cardiovascular Pedal Pulses WNL. No clubbing, cyanosis or edema. Lymphatic No adneopathy. No adenopathy. No adenopathy. Musculoskeletal Adexa without tenderness or enlargement.. Digits and nails w/o clubbing, cyanosis, infection, petechiae, ischemia, or inflammatory conditions.Marland Kitchen Psychiatric Judgement and insight Intact.. No evidence of depression, anxiety, or agitation.. General Notes: the hypergranulation tissue has gone down and the wound is looking excellent with a minimal area open. Integumentary (Hair, Skin) No suspicious lesions. No crepitus or fluctuance. No peri-wound warmth or erythema. No masses.. Wound #1 status is Open. Original cause of wound was Surgical Injury. The wound is located on the Left,Lateral Lower Leg. The wound measures 0.4cm length x 0.2cm width x 0.1cm depth; 0.063cm^2 area and 0.006cm^3 volume. The wound is limited to skin breakdown. There is no tunneling or undermining noted. There is a small amount of serosanguineous drainage noted. The wound margin is flat and intact. There is large (67-100%) red granulation within the wound bed. There is no necrotic tissue within the wound bed. The periwound skin appearance did not exhibit: Callus, Crepitus, Excoriation, Fluctuance, Friable, Induration, Localized Edema, Rash, Scarring, Dry/Scaly, Maceration, Moist, Atrophie Blanche, Cyanosis, Ecchymosis, Hemosiderin Staining, Mottled, Pallor, Rubor, Erythema. Periwound temperature was noted as No Abnormality. The periwound has  tenderness on palpation. Jacob [name], Jacob [name] ([id number]) Assessment Active Problems ICD-10 E11.622 - Type 2 diabetes mellitus with other skin ulcer T81.31XA - Disruption of external operation (surgical) wound, not elsewhere classified, initial encounter L97.222 - Non-pressure chronic ulcer of left calf with fat layer exposed F17.228 - Nicotine dependence, chewing tobacco, with other nicotine-induced disorders E66.01 - Morbid (severe) obesity due to excess calories Plan Wound Cleansing: Wound #1 Left,Lateral Lower Leg: Clean wound with Normal Saline. Anesthetic: Wound #1 Left,Lateral Lower Leg: Topical Lidocaine 4% cream applied to wound bed prior to debridement Primary Wound Dressing: Wound #1 Left,Lateral Lower Leg: Aquacel Ag Secondary Dressing: Wound #1 Left,Lateral Lower Leg: Boardered Foam Dressing Dressing Change Frequency: Wound #1 Left,Lateral Lower Leg: Change dressing every other day. Follow-up Appointments: Wound #1 Left,Lateral Lower Leg: Return Appointment in 1 week. Edema Control: Wound #1 Left,Lateral Lower Leg: Patient to wear own compression stockings Elevate legs to the level of the heart and pump ankles as often as possible I have  recommended we continue silver alginate some compression with a bolster of cause and then uses compression stockings which he is wearing diligently. I anticipate the wound may be closed by next week Jacob [name], [name] ([id number]) Electronic Signature(s) Signed: 05/11/2015 1:46:55 PM By: Christin Fudge MD, FACS Entered By: Christin Fudge on 05/11/2015 13:46:55 [name] ([id number]) -------------------------------------------------------------------------------- [place] Details Patient Name: Jacob [name], [name] Date of Service: 05/11/2015 Medical Record Number: [id number] Patient Account Number: [id number] Date of Birth/Sex: July 22, 1970 (45 y.o. Male) Treating RN: Baruch Gouty, RN, BSN, [place] Primary Care Physician: Gayland Curry Other Clinician: Referring Physician: Gayland Curry Treating Physician/Extender: Frann Rider in Treatment: 8 Diagnosis Coding ICD-10 Codes Code Description E11.622 Type 2 diabetes mellitus with other skin ulcer Disruption of external operation (surgical) wound, not elsewhere classified, initial T81.31XA encounter L97.222 Non-pressure chronic ulcer of left calf with fat layer exposed F17.228 Nicotine dependence, chewing tobacco, with other nicotine-induced disorders E66.01 Morbid (severe) obesity due to excess calories Facility Procedures CPT4 Code: [id number] Description: [id number] - WOUND CARE VISIT-LEV 2 EST PT Modifier: Quantity: 1 Physician Procedures CPT4: Description Modifier Quantity Code [id number] - WC PHYS LEVEL 3 - EST PT 1 ICD-10 Description Diagnosis E11.622 Type 2 diabetes mellitus with other skin ulcer T81.31XA Disruption of external operation (surgical) wound, not elsewhere classified,  initial encounter L97.222 Non-pressure chronic ulcer of left calf with fat layer exposed E66.01 Morbid (severe) obesity due to excess calories Electronic Signature(s) Signed: 05/11/2015 1:47:12 PM By: Christin Fudge MD, FACS Entered By: Christin Fudge on 05/11/2015 13:47:12

## 2015-05-19 ENCOUNTER — Encounter: Payer: BC Managed Care – PPO | Admitting: Surgery

## 2015-05-19 DIAGNOSIS — E11622 Type 2 diabetes mellitus with other skin ulcer: Secondary | ICD-10-CM | POA: Diagnosis not present

## 2015-05-20 NOTE — Progress Notes (Signed)
CHRIS, GARCIA ([id number]) Visit Report for 05/19/2015 Arrival Information Details Patient Name: [name], [name] Date of Service: 05/19/2015 1:30 PM Medical Record Number: [id number] Patient Account Number: [id number] Date of Birth/Sex: [birth year] (45 y.o. Male) Treating RN: Ahmed Prima Primary Care Physician: Gayland Curry Other Clinician: Referring Physician: Gayland Curry Treating Physician/Extender: Frann Rider in Treatment: 9 Visit Information History Since Last Visit All ordered tests and consults were completed: No Patient Arrived: Ambulatory Added or deleted any medications: No Arrival Time: 13:38 Any new allergies or adverse reactions: No Accompanied By: self Had a fall or experienced change in No Transfer Assistance: None activities of daily living that may affect Patient Identification Verified: Yes risk of falls: Secondary Verification Process Yes Signs or symptoms of abuse/neglect since last No Completed: visito Patient Requires Transmission-Based No Hospitalized since last visit: No Precautions: Pain Present Now: No Patient Has Alerts: Yes Patient Alerts: DMII Electronic Signature(s) Signed: 05/19/2015 5:04:23 PM By: Alric Quan Entered By: Alric Quan on 05/19/2015 13:38:26 Notz, Jacob [name] ([id number]) -------------------------------------------------------------------------------- Clinic Level of Care Assessment Details Patient Name: Jacob Dee, [name] Date of Service: 05/19/2015 1:30 PM Medical Record Number: [id number] Patient Account Number: [id number] Date of Birth/Sex: Oct 13, 1970 (45 y.o. Male) Treating RN: Ahmed Prima Primary Care Physician: Gayland Curry Other Clinician: Referring Physician: Gayland Curry Treating Physician/Extender: Frann Rider in Treatment: 9 Clinic Level of Care Assessment Items TOOL 4 Quantity Score []  - Use when only an EandM is performed on FOLLOW-UP visit  0 ASSESSMENTS - Nursing Assessment / Reassessment []  - Reassessment of Co-morbidities (includes updates in patient status) 0 X - Reassessment of Adherence to Treatment Plan 1 5 ASSESSMENTS - Wound and Skin Assessment / Reassessment X - Simple Wound Assessment / Reassessment - one wound 1 5 []  - Complex Wound Assessment / Reassessment - multiple wounds 0 []  - Dermatologic / Skin Assessment (not related to wound area) 0 ASSESSMENTS - Focused Assessment []  - Circumferential Edema Measurements - multi extremities 0 []  - Nutritional Assessment / Counseling / Intervention 0 []  - Lower Extremity Assessment (monofilament, tuning fork, pulses) 0 []  - Peripheral Arterial Disease Assessment (using hand held doppler) 0 ASSESSMENTS - Ostomy and/or Continence Assessment and Care []  - Incontinence Assessment and Management 0 []  - Ostomy Care Assessment and Management (repouching, etc.) 0 PROCESS - Coordination of Care X - Simple Patient / Family Education for ongoing care 1 15 []  - Complex (extensive) Patient / Family Education for ongoing care 0 []  - Staff obtains Programmer, systems, Records, Test Results / Process Orders 0 []  - Staff telephones HHA, Nursing Homes / Clarify orders / etc 0 []  - Routine Transfer to another Facility (non-emergent condition) 0 [name], [name] ([id number]) []  - Routine Hospital Admission (non-emergent condition) 0 []  - New Admissions / Biomedical engineer / Ordering NPWT, Apligraf, etc. 0 []  - Emergency Hospital Admission (emergent condition) 0 X - Simple Discharge Coordination 1 10 []  - Complex (extensive) Discharge Coordination 0 PROCESS - Special Needs []  - Pediatric / Minor Patient Management 0 []  - Isolation Patient Management 0 []  - Hearing / Language / Visual special needs 0 []  - Assessment of Community assistance (transportation, D/C planning, etc.) 0 []  - Additional assistance / Altered mentation 0 []  - Support Surface(s) Assessment (bed, cushion, seat, etc.)  0 INTERVENTIONS - Wound Cleansing / Measurement X - Simple Wound Cleansing - one wound 1 5 []  - Complex Wound Cleansing - multiple wounds 0 X - Wound Imaging (photographs - any number of wounds) 1 5 []  -  Wound Tracing (instead of photographs) 0 X - Simple Wound Measurement - one wound 1 5 []  - Complex Wound Measurement - multiple wounds 0 INTERVENTIONS - Wound Dressings X - Small Wound Dressing one or multiple wounds 1 10 []  - Medium Wound Dressing one or multiple wounds 0 []  - Large Wound Dressing one or multiple wounds 0 X - Application of Medications - topical 1 5 []  - Application of Medications - injection 0 INTERVENTIONS - Miscellaneous []  - External ear exam 0 [name], [name] ([id number]) []  - Specimen Collection (cultures, biopsies, blood, body fluids, etc.) 0 []  - Specimen(s) / Culture(s) sent or taken to Lab for analysis 0 []  - Patient Transfer (multiple staff / Harrel Lemon Lift / Similar devices) 0 []  - Simple Staple / Suture removal (25 or less) 0 []  - Complex Staple / Suture removal (26 or more) 0 []  - Hypo / Hyperglycemic Management (close monitor of Blood Glucose) 0 []  - Ankle / Brachial Index (ABI) - do not check if billed separately 0 X - Vital Signs 1 5 Has the patient been seen at the hospital within the last three years: Yes Total Score: 70 Level Of Care: New/Established - Level 2 Electronic Signature(s) Signed: 05/19/2015 5:04:23 PM By: Alric Quan Entered By: Alric Quan on 05/19/2015 16:21:58 Jacob [name] ([id number]) -------------------------------------------------------------------------------- Encounter Discharge Information Details Patient Name: Jacob Dee, [name] Date of Service: 05/19/2015 1:30 PM Medical Record Number: [id number] Patient Account Number: [id number] Date of Birth/Sex: [birth year] (45 y.o. Male) Treating RN: Ahmed Prima Primary Care Physician: Gayland Curry Other Clinician: Referring Physician: Gayland Curry Treating Physician/Extender: Frann Rider in Treatment: 9 Encounter Discharge Information Items Discharge Pain Level: 0 Discharge Condition: Stable Ambulatory Status: Ambulatory Discharge Destination: Home Transportation: Private Auto Accompanied By: self Schedule Follow-up Appointment: Yes Medication Reconciliation completed and provided to Patient/Care Yes Crystal Scarberry: Provided on Clinical Summary of Care: 05/19/2015 Form Type Recipient Paper Patient [facility] Electronic Signature(s) Signed: 05/19/2015 2:15:44 PM By: Ruthine Dose Entered By: Ruthine Dose on 05/19/2015 14:15:44 Jacob [name] ([id number]) -------------------------------------------------------------------------------- Lower Extremity Assessment Details Patient Name: Jacob Dee, [name] Date of Service: 05/19/2015 1:30 PM Medical Record Number: [id number] Patient Account Number: [id number] Date of Birth/Sex: 1971-02-21 (45 y.o. Male) Treating RN: Ahmed Prima Primary Care Physician: Gayland Curry Other Clinician: Referring Physician: Gayland Curry Treating Physician/Extender: Frann Rider in Treatment: 9 Vascular Assessment Pulses: Posterior Tibial Dorsalis Pedis Palpable: [Left:Yes] Extremity colors, hair growth, and conditions: Extremity Color: [Left:Normal] Temperature of Extremity: [Left:Warm] Capillary Refill: [Left:< 3 seconds] Electronic Signature(s) Signed: 05/19/2015 5:04:23 PM By: Alric Quan Entered By: Alric Quan on 05/19/2015 13:44:45 Giuffre, [place] Loni [name] ([id number]) -------------------------------------------------------------------------------- Multi Wound Chart Details Patient Name: Jacob Dee, [name] Date of Service: 05/19/2015 1:30 PM Medical Record Number: [id number] Patient Account Number: [id number] Date of Birth/Sex: 1971/03/11 (45 y.o. Male) Treating RN: Ahmed Prima Primary Care Physician: Gayland Curry Other Clinician: Referring  Physician: Gayland Curry Treating Physician/Extender: Frann Rider in Treatment: 9 Vital Signs Height(in): 72 Pulse(bpm): 64 Weight(lbs): 360 Blood Pressure 177/85 (mmHg): Body Mass Index(BMI): 49 Temperature(F): 98.1 Respiratory Rate 20 (breaths/min): Photos: [1:No Photos] [N/A:N/A] Wound Location: [1:Left Lower Leg - Lateral] [N/A:N/A] Wounding Event: [1:Surgical Injury] [N/A:N/A] Primary Etiology: [1:Open Surgical Wound] [N/A:N/A] Comorbid History: [1:Sleep Apnea, Hypertension, Type II Diabetes, Gout] [N/A:N/A] Date Acquired: [1:01/30/2015] [N/A:N/A] Weeks of Treatment: [1:9] [N/A:N/A] Wound Status: [1:Open] [N/A:N/A] Measurements L x W x D 0.3x0.4x0.1 [N/A:N/A] (cm) Area (cm) : [1:0.094] [N/A:N/A] Volume (cm) : [1:0.009] [N/A:N/A] % Reduction in Area: [1:96.70%] [N/A:N/A] %  Reduction in Volume: 96.80% [N/A:N/A] Classification: [1:Full Thickness Without Exposed Support Structures] [N/A:N/A] HBO Classification: [1:Grade 1] [N/A:N/A] Exudate Amount: [1:Medium] [N/A:N/A] Exudate Type: [1:Serosanguineous] [N/A:N/A] Exudate Color: [1:red, brown] [N/A:N/A] Wound Margin: [1:Flat and Intact] [N/A:N/A] Granulation Amount: [1:Large (67-100%)] [N/A:N/A] Granulation Quality: [1:Red, Hyper-granulation] [N/A:N/A] Necrotic Amount: [1:None Present (0%)] [N/A:N/A] Exposed Structures: [1:Fascia: No Fat: No Tendon: No] [N/A:N/A] Muscle: No Joint: No Bone: No Limited to Skin Breakdown Epithelialization: Small (1-33%) N/A N/A Periwound Skin Texture: Edema: No N/A N/A Excoriation: No Induration: No Callus: No Crepitus: No Fluctuance: No Friable: No Rash: No Scarring: No Periwound Skin Maceration: No N/A N/A Moisture: Moist: No Dry/Scaly: No Periwound Skin Color: Atrophie Blanche: No N/A N/A Cyanosis: No Ecchymosis: No Erythema: No Hemosiderin Staining: No Mottled: No Pallor: No Rubor: No Temperature: No Abnormality N/A N/A Tenderness on Yes N/A  N/A Palpation: Wound Preparation: Ulcer Cleansing: N/A N/A Rinsed/Irrigated with Saline Topical Anesthetic Applied: Other: lidocaine 4% Treatment Notes Electronic Signature(s) Signed: 05/19/2015 5:04:23 PM By: Alric Quan Entered By: Alric Quan on 05/19/2015 13:47:43 Jacob [name] ([id number]) -------------------------------------------------------------------------------- [place] Details Patient Name: Jacob Dee, [name] Date of Service: 05/19/2015 1:30 PM Medical Record Number: [id number] Patient Account Number: [id number] Date of Birth/Sex: 1971-03-30 (45 y.o. Male) Treating RN: Ahmed Prima Primary Care Physician: Gayland Curry Other Clinician: Referring Physician: Gayland Curry Treating Physician/Extender: Frann Rider in Treatment: 9 Active Inactive Orientation to the Wound Care Program Nursing Diagnoses: Knowledge deficit related to the wound healing center program Goals: Patient/caregiver will verbalize understanding of the [place] Program Date Initiated: 03/14/2015 Goal Status: Active Interventions: Provide education on orientation to the wound center Notes: Wound/Skin Impairment Nursing Diagnoses: Impaired tissue integrity Goals: Patient/caregiver will verbalize understanding of skin care regimen Date Initiated: 03/14/2015 Goal Status: Active Ulcer/skin breakdown will have a volume reduction of 30% by week 4 Date Initiated: 03/14/2015 Goal Status: Active Ulcer/skin breakdown will have a volume reduction of 50% by week 8 Date Initiated: 03/14/2015 Goal Status: Active Ulcer/skin breakdown will have a volume reduction of 80% by week 12 Date Initiated: 03/14/2015 Goal Status: Active Ulcer/skin breakdown will heal within 14 weeks Date Initiated: 03/14/2015 Goal Status: Active GARRET, MERE ([id number]) Interventions: Assess patient/caregiver ability to perform ulcer/skin care regimen  upon admission and as needed Assess ulceration(s) every visit Notes: Electronic Signature(s) Signed: 05/19/2015 5:04:23 PM By: Alric Quan Entered By: Alric Quan on 05/19/2015 13:47:36 Kopera, Domingo AMarland Kitchen ([id number]) -------------------------------------------------------------------------------- Pain Assessment Details Patient Name: Jacob Dee, [name] Date of Service: 05/19/2015 1:30 PM Medical Record Number: [id number] Patient Account Number: [id number] Date of Birth/Sex: 01-16-1971 (45 y.o. Male) Treating RN: Ahmed Prima Primary Care Physician: Gayland Curry Other Clinician: Referring Physician: Gayland Curry Treating Physician/Extender: Frann Rider in Treatment: 9 Active Problems Location of Pain Severity and Description of Pain Patient Has Paino No Site Locations Pain Management and Medication Current Pain Management: Electronic Signature(s) Signed: 05/19/2015 5:04:23 PM By: Alric Quan Entered By: Alric Quan on 05/19/2015 13:38:32 Jacob [name] ([id number]) -------------------------------------------------------------------------------- Patient/Caregiver Education Details Patient Name: Jacob Dee, [name] Date of Service: 05/19/2015 1:30 PM Medical Record Number: [id number] Patient Account Number: [id number] Date of Birth/Gender: [birth year] (44 y.o. Male) Treating RN: Ahmed Prima Primary Care Physician: Gayland Curry Other Clinician: Referring Physician: Gayland Curry Treating Physician/Extender: Frann Rider in Treatment: 9 Education Assessment Education Provided To: Patient Education Topics Provided Wound/Skin Impairment: Handouts: Other: change dressing as ordered Methods: Demonstration, Explain/Verbal Responses: State content correctly Electronic Signature(s) Signed: 05/19/2015 5:04:23 PM By: Alric Quan Entered By:  Alric Quan on 05/19/2015 13:52:07 Jacob [name]  ([id number]) -------------------------------------------------------------------------------- Wound Assessment Details Patient Name: MCHAFFIE, [name] Date of Service: 05/19/2015 1:30 PM Medical Record Number: [id number] Patient Account Number: [id number] Date of Birth/Sex: December 07, 1970 (45 y.o. Male) Treating RN: Ahmed Prima Primary Care Physician: Gayland Curry Other Clinician: Referring Physician: Gayland Curry Treating Physician/Extender: Frann Rider in Treatment: 9 Wound Status Wound Number: 1 Primary Open Surgical Wound Etiology: Wound Location: Left Lower Leg - Lateral Wound Status: Open Wounding Event: Surgical Injury Comorbid Sleep Apnea, Hypertension, Type II Date Acquired: 01/30/2015 History: Diabetes, Gout Weeks Of Treatment: 9 Clustered Wound: No Photos Photo Uploaded By: Alric Quan on 05/19/2015 16:53:17 Wound Measurements Length: (cm) 0.3 Width: (cm) 0.4 Depth: (cm) 0.1 Area: (cm) 0.094 Volume: (cm) 0.009 % Reduction in Area: 96.7% % Reduction in Volume: 96.8% Epithelialization: Small (1-33%) Tunneling: No Undermining: No Wound Description Full Thickness Without Exposed Foul Odor A Classification: Support Structures Diabetic Severity Grade 1 (Wagner): Wound Margin: Flat and Intact Exudate Amount: Medium Exudate Type: Serosanguineous Exudate Color: red, brown fter Cleansing: No Wound Bed Granulation Amount: Large (67-100%) Exposed Structure Granulation Quality: Red, Hyper-granulation Fascia Exposed: No Croghan, Kameron A. ([id number]) Necrotic Amount: None Present (0%) Fat Layer Exposed: No Tendon Exposed: No Muscle Exposed: No Joint Exposed: No Bone Exposed: No Limited to Skin Breakdown Periwound Skin Texture Texture Color No Abnormalities Noted: No No Abnormalities Noted: No Callus: No Atrophie Blanche: No Crepitus: No Cyanosis: No Excoriation: No Ecchymosis: No Fluctuance: No Erythema: No Friable:  No Hemosiderin Staining: No Induration: No Mottled: No Localized Edema: No Pallor: No Rash: No Rubor: No Scarring: No Temperature / Pain Moisture Temperature: No Abnormality No Abnormalities Noted: No Tenderness on Palpation: Yes Dry / Scaly: No Maceration: No Moist: No Wound Preparation Ulcer Cleansing: Rinsed/Irrigated with Saline Topical Anesthetic Applied: Other: lidocaine 4%, Treatment Notes Wound #1 (Left, Lateral Lower Leg) 1. Cleansed with: Clean wound with Normal Saline 2. Anesthetic Topical Lidocaine 4% cream to wound bed prior to debridement 4. Dressing Applied: Other dressing (specify in notes) 5. Secondary Dressing Applied Gauze and Kerlix/Conform 7. Secured with Tape Notes Mepilex Lite Electronic Signature(s) Signed: 05/19/2015 5:04:23 PM By: Jacklynn Barnacle, Alfreddie A. ([id number]) Entered By: Alric Quan on 05/19/2015 13:46:49 Jacob [name] ([id number]) -------------------------------------------------------------------------------- [place] Details Patient Name: Jacob Dee, [name] Date of Service: 05/19/2015 1:30 PM Medical Record Number: [id number] Patient Account Number: [id number] Date of Birth/Sex: 1970-06-27 (45 y.o. Male) Treating RN: Ahmed Prima Primary Care Physician: Gayland Curry Other Clinician: Referring Physician: Gayland Curry Treating Physician/Extender: Frann Rider in Treatment: 9 Vital Signs Time Taken: 13:38 Temperature (F): 98.1 Height (in): 72 Pulse (bpm): 64 Weight (lbs): 360 Respiratory Rate (breaths/min): 20 Body Mass Index (BMI): 48.8 Blood Pressure (mmHg): 177/85 Reference Range: 80 - 120 mg / dl Electronic Signature(s) Signed: 05/19/2015 5:04:23 PM By: Alric Quan Entered By: Alric Quan on 05/19/2015 13:42:32

## 2015-05-20 NOTE — Progress Notes (Addendum)
JOHNATHAN, MOUND ([id number]) Visit Report for 05/19/2015 Chief Complaint Document Details Patient Name: CORTAZZO, Hakiem A. Date of Service: 05/19/2015 1:30 PM Medical Record Number: [id number] Patient Account Number: [id number] Date of Birth/Sex: [birth year] (45 y.o. Male) Treating RN: Ahmed Prima Primary Care Physician: Gayland Curry Other Clinician: Referring Physician: Gayland Curry Treating Physician/Extender: Frann Rider in Treatment: 9 Information Obtained from: Patient Chief Complaint Patient presents to the wound care center for a consult due non healing wound of the left lower extremity which he has had for about a month and a half. Electronic Signature(s) Signed: 05/19/2015 2:07:31 PM By: Christin Fudge MD, FACS Entered By: Christin Fudge on 05/19/2015 14:07:31 [name] ([id number]) -------------------------------------------------------------------------------- HPI Details Patient Name: [name], Brennon A. Date of Service: 05/19/2015 1:30 PM Medical Record Number: [id number] Patient Account Number: [id number] Date of Birth/Sex: 12-28-1970 (45 y.o. Male) Treating RN: Ahmed Prima Primary Care Physician: Gayland Curry Other Clinician: Referring Physician: Gayland Curry Treating Physician/Extender: Frann Rider in Treatment: 9 History of Present Illness Location: left lower extremity laterally Quality: Patient reports experiencing a dull pain to affected area(s). Severity: Patient states wound (s) are getting better. Duration: Patient has had the wound for < 6 weeks prior to presenting for treatment Timing: Pain in wound is Intermittent (comes and goes Context: The wound occurred when the patient had a shave biopsy of a lesion on that leg which was benign Modifying Factors: Other treatment(s) tried include:was put on Keflex and Bactrim by his PCP Associated Signs and Symptoms: Patient reports presence of swelling HPI  Description: Pleasant 45 year old gentleman who had a shave biopsy done from his left lower extremity on October 3 and the wound has not healed since then. This was a benign disease and he was concerned about the length of time it has taken to heal. He has past medical history of diabetes mellitus, gout, hypertension, sleep apnea and is status post tonsillectomy and adenoidectomy, cholecystectomy and uvulopalatopharyngoplasty. he was recently seen by his PCP Dr. Jodi Mourning who had already given him Keflex for a week and then put him on Bactrim for 10 days. the patient was a smoker before and quit in 1995 but currently chews tobacco on a daily basis. 05/04/2015 -- he has been wearing compression stockings which we had ordered from a couple of weeks ago and has been doing his dressing as his advice and overall seems to be doing well. 05/19/2015 -- for some reason when his dressing was removed today he has had a bit of reaction to the bordered foam and hence has some redness around the ulcerated area. He has given up chewing tobacco for the last week. Electronic Signature(s) Signed: 05/19/2015 2:08:09 PM By: Christin Fudge MD, FACS Entered By: Christin Fudge on 05/19/2015 14:08:09 [name] ([id number]) -------------------------------------------------------------------------------- Physical Exam Details Patient Name: [name], Davin A. Date of Service: 05/19/2015 1:30 PM Medical Record Number: [id number] Patient Account Number: [id number] Date of Birth/Sex: 1971/02/03 (45 y.o. Male) Treating RN: Ahmed Prima Primary Care Physician: Gayland Curry Other Clinician: Referring Physician: Gayland Curry Treating Physician/Extender: Frann Rider in Treatment: 9 Constitutional . Pulse regular. Respirations normal and unlabored. Afebrile. . Eyes Nonicteric. Reactive to light. Ears, Nose, Mouth, and Throat Lips, teeth, and gums WNL.Marland Kitchen Moist mucosa without  lesions. Neck supple and nontender. No palpable supraclavicular or cervical adenopathy. Normal sized without goiter. Respiratory WNL. No retractions.. Cardiovascular Pedal Pulses WNL. No clubbing, cyanosis or edema. Lymphatic No adneopathy. No adenopathy. No adenopathy. Musculoskeletal Adexa without tenderness or enlargement.Marland Kitchen  Digits and nails w/o clubbing, cyanosis, infection, petechiae, ischemia, or inflammatory conditions.. Integumentary (Hair, Skin) No suspicious lesions. No crepitus or fluctuance. No peri-wound warmth or erythema. No masses.Marland Kitchen Psychiatric Judgement and insight Intact.. No evidence of depression, anxiety, or agitation.. Notes the wound by itself is looking excellent with a very small area open but the surrounding skin shows some erythema possibly as a reaction to the formula was using. Electronic Signature(s) Signed: 05/19/2015 2:08:40 PM By: Christin Fudge MD, FACS Entered By: Christin Fudge on 05/19/2015 14:08:40 [name] ([id number]) -------------------------------------------------------------------------------- Physician Orders Details Patient Name: [name], Reco A. Date of Service: 05/19/2015 1:30 PM Medical Record Number: [id number] Patient Account Number: [id number] Date of Birth/Sex: 1971/02/25 (45 y.o. Male) Treating RN: Ahmed Prima Primary Care Physician: Gayland Curry Other Clinician: Referring Physician: Gayland Curry Treating Physician/Extender: Frann Rider in Treatment: 9 Verbal / Phone Orders: Yes Clinician: Carolyne Fiscal, Debi Read Back and Verified: Yes Diagnosis Coding ICD-10 Coding Code Description E11.622 Type 2 diabetes mellitus with other skin ulcer Disruption of external operation (surgical) wound, not elsewhere classified, initial T81.31XA encounter L97.222 Non-pressure chronic ulcer of left calf with fat layer exposed F17.228 Nicotine dependence, chewing tobacco, with other nicotine-induced  disorders E66.01 Morbid (severe) obesity due to excess calories Wound Cleansing Wound #1 Left,Lateral Lower Leg o Clean wound with Normal Saline. Anesthetic Wound #1 Left,Lateral Lower Leg o Topical Lidocaine 4% cream applied to wound bed prior to debridement Primary Wound Dressing Wound #1 Left,Lateral Lower Leg o Other: - Mepilex Lite Secondary Dressing Wound #1 Left,Lateral Lower Leg o Gauze and Kerlix/Conform Dressing Change Frequency Wound #1 Left,Lateral Lower Leg o Change dressing every other day. Follow-up Appointments Wound #1 Left,Lateral Lower Leg o Return Appointment in 1 week. Edema Control Widdowson, Kelten A. ([id number]) Wound #1 Left,Lateral Lower Leg o Patient to wear own compression stockings o Elevate legs to the level of the heart and pump ankles as often as possible Electronic Signature(s) Signed: 05/19/2015 4:37:54 PM By: Christin Fudge MD, FACS Signed: 05/19/2015 5:04:23 PM By: Alric Quan Entered By: Alric Quan on 05/19/2015 14:12:58 Peil, Sten Loni Muse ([id number]) -------------------------------------------------------------------------------- Problem List Details Patient Name: [name], Calin A. Date of Service: 05/19/2015 1:30 PM Medical Record Number: [id number] Patient Account Number: [id number] Date of Birth/Sex: December 07, 1970 (44 y.o. 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 mellitus with other skin ulcer 03/14/2015 Yes T81.31XA Disruption of external operation (surgical) wound, not 03/14/2015 Yes elsewhere classified, initial encounter L97.222 Non-pressure chronic ulcer of left calf with fat layer 03/14/2015 Yes exposed F17.228 Nicotine dependence, chewing tobacco,  with other 03/14/2015 Yes nicotine-induced disorders E66.01 Morbid (severe) obesity due to excess calories 03/14/2015 Yes Inactive Problems Resolved Problems Electronic Signature(s) Signed: 05/19/2015 2:07:23 PM By: Christin Fudge MD, FACS Entered By: Christin Fudge on 05/19/2015 14:07:23 [name] ([id number]) -------------------------------------------------------------------------------- Progress Note Details Patient Name: [name], Montgomery A. Date of Service: 05/19/2015 1:30 PM Medical Record Number: [id number] Patient Account Number: [id number] Date of Birth/Sex: [birth year] (45 y.o. Male) Treating RN: Ahmed Prima Primary Care Physician: Gayland Curry Other Clinician: Referring Physician: Gayland Curry Treating Physician/Extender: Frann Rider in Treatment: 9 Subjective Chief Complaint Information obtained from Patient Patient presents to the wound care center for a consult due non healing wound of the left lower extremity which he has had for about a month and a half. History of  Present Illness (HPI) The following HPI elements were documented for the patient's wound: Location: left lower extremity laterally Quality: Patient reports experiencing a dull pain to affected area(s). Severity: Patient states wound (s) are getting better. Duration: Patient has had the wound for < 6 weeks prior to presenting for treatment Timing: Pain in wound is Intermittent (comes and goes Context: The wound occurred when the patient had a shave biopsy of a lesion on that leg which was benign Modifying Factors: Other treatment(s) tried include:was put on Keflex and Bactrim by his PCP Associated Signs and Symptoms: Patient reports presence of swelling Pleasant 45 year old gentleman who had a shave biopsy done from his left lower extremity on October 3 and the wound has not healed since then. This was a benign disease and he was concerned about the length of time it has taken to  heal. He has past medical history of diabetes mellitus, gout, hypertension, sleep apnea and is status post tonsillectomy and adenoidectomy, cholecystectomy and uvulopalatopharyngoplasty. he was recently seen by his PCP Dr. Jodi Mourning who had already given him Keflex for a week and then put him on Bactrim for 10 days. the patient was a smoker before and quit in 1995 but currently chews tobacco on a daily basis. 05/04/2015 -- he has been wearing compression stockings which we had ordered from a couple of weeks ago and has been doing his dressing as his advice and overall seems to be doing well. 05/19/2015 -- for some reason when his dressing was removed today he has had a bit of reaction to the bordered foam and hence has some redness around the ulcerated area. He has given up chewing tobacco for the last week. Objective Eshleman, Eddi A. ([id number]) Constitutional Pulse regular. Respirations normal and unlabored. Afebrile. Vitals Time Taken: 1:38 PM, Height: 72 in, Weight: 360 lbs, BMI: 48.8, Temperature: 98.1 F, Pulse: 64 bpm, Respiratory Rate: 20 breaths/min, Blood Pressure: 177/85 mmHg. Eyes Nonicteric. Reactive to light. Ears, Nose, Mouth, and Throat Lips, teeth, and gums WNL.Marland Kitchen Moist mucosa without lesions. Neck supple and nontender. No palpable supraclavicular or cervical adenopathy. Normal sized without goiter. Respiratory WNL. No retractions.. Cardiovascular Pedal Pulses WNL. No clubbing, cyanosis or edema. Lymphatic No adneopathy. No adenopathy. No adenopathy. Musculoskeletal Adexa without tenderness or enlargement.. Digits and nails w/o clubbing, cyanosis, infection, petechiae, ischemia, or inflammatory conditions.Marland Kitchen Psychiatric Judgement and insight Intact.. No evidence of depression, anxiety, or agitation.. General Notes: the wound by itself is looking excellent with a very small area open but the surrounding skin shows some erythema possibly as a reaction to the  formula was using. Integumentary (Hair, Skin) No suspicious lesions. No crepitus or fluctuance. No peri-wound warmth or erythema. No masses.. Wound #1 status is Open. Original cause of wound was Surgical Injury. The wound is located on the Left,Lateral Lower Leg. The wound measures 0.3cm length x 0.4cm width x 0.1cm depth; 0.094cm^2 area and 0.009cm^3 volume. The wound is limited to skin breakdown. There is no tunneling or undermining noted. There is a medium amount of serosanguineous drainage noted. The wound margin is flat and intact. There is large (67-100%) red granulation within the wound bed. There is no necrotic tissue within the wound bed. The periwound skin appearance did not exhibit: Callus, Crepitus, Excoriation, Fluctuance, Friable, Induration, Localized Edema, Rash, Scarring, Dry/Scaly, Maceration, Moist, Atrophie Blanche, Cyanosis, Ecchymosis, Hemosiderin Staining, Mottled, Pallor, Rubor, Erythema. Periwound temperature was noted as No Abnormality. The periwound has tenderness on palpation. [name] ([id number]) Assessment Active Problems ICD-10  E11.622 - Type 2 diabetes mellitus with other skin ulcer T81.31XA - Disruption of external operation (surgical) wound, not elsewhere classified, initial encounter L97.222 - Non-pressure chronic ulcer of left calf with fat layer exposed F17.228 - Nicotine dependence, chewing tobacco, with other nicotine-induced disorders E66.01 - Morbid (severe) obesity due to excess calories Plan Wound Cleansing: Wound #1 Left,Lateral Lower Leg: Clean wound with Normal Saline. Anesthetic: Wound #1 Left,Lateral Lower Leg: Topical Lidocaine 4% cream applied to wound bed prior to debridement Primary Wound Dressing: Wound #1 Left,Lateral Lower Leg: Other: - Mepilex Lite Secondary Dressing: Wound #1 Left,Lateral Lower Leg: Gauze and Kerlix/Conform Dressing Change Frequency: Wound #1 Left,Lateral Lower Leg: Change dressing every other  day. Follow-up Appointments: Wound #1 Left,Lateral Lower Leg: Return Appointment in 1 week. Edema Control: Wound #1 Left,Lateral Lower Leg: Patient to wear own compression stockings Elevate legs to the level of the heart and pump ankles as often as possible Mcdermott, Giuliano A. ([id number]) I have commended him on giving up tobacco and have changed his dressing around to use a Mepilex light with a gauze dressing and continue to wear his compression stockings. I anticipate he'll be healed soon Electronic Signature(s) Signed: 05/19/2015 4:42:09 PM By: Christin Fudge MD, FACS Previous Signature: 05/19/2015 4:41:51 PM Version By: Christin Fudge MD, FACS Previous Signature: 05/19/2015 2:09:12 PM Version By: Christin Fudge MD, FACS Entered By: Christin Fudge on 05/19/2015 16:42:09 [name] ([id number]) -------------------------------------------------------------------------------- SuperBill Details Patient Name: [name], Asir A. Date of Service: 05/19/2015 Medical Record Number: [id number] Patient Account Number: [id number] Date of Birth/Sex: October 15, 1970 (45 y.o. Male) Treating RN: Ahmed Prima Primary Care Physician: Gayland Curry Other Clinician: Referring Physician: Gayland Curry Treating Physician/Extender: Frann Rider in Treatment: 9 Diagnosis Coding ICD-10 Codes Code Description E11.622 Type 2 diabetes mellitus with other skin ulcer Disruption of external operation (surgical) wound, not elsewhere classified, initial T81.31XA encounter L97.222 Non-pressure chronic ulcer of left calf with fat layer exposed F17.228 Nicotine dependence, chewing tobacco, with other nicotine-induced disorders E66.01 Morbid (severe) obesity due to excess calories Facility Procedures CPT4 Code: [id number] Description: [id number] - WOUND CARE VISIT-LEV 2 EST PT Modifier: Quantity: 1 Physician Procedures CPT4: Description Modifier Quantity Code [id number] - WC PHYS LEVEL 3 - EST PT  1 ICD-10 Description Diagnosis 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

## 2015-05-24 ENCOUNTER — Encounter: Payer: Self-pay | Admitting: *Deleted

## 2015-05-24 ENCOUNTER — Ambulatory Visit
Admission: EM | Admit: 2015-05-24 | Discharge: 2015-05-24 | Disposition: A | Discharge status: Home / Self Care | Payer: BC Managed Care – PPO | Attending: Family Medicine | Admitting: Family Medicine

## 2015-05-24 DIAGNOSIS — M25561 Pain in right knee: Secondary | ICD-10-CM | POA: Diagnosis not present

## 2015-05-24 DIAGNOSIS — L03032 Cellulitis of left toe: Secondary | ICD-10-CM

## 2015-05-24 MED ORDER — INDOMETHACIN 25 MG PO CAPS
25.0000 mg | ORAL_CAPSULE | Freq: Three times a day (TID) | ORAL | Status: DC | PRN
Start: 2015-05-24 — End: 2016-08-19

## 2015-05-24 MED ORDER — SULFAMETHOXAZOLE-TRIMETHOPRIM 800-160 MG PO TABS: 1.0000 | ORAL_TABLET | Freq: Two times a day (BID) | ORAL | Status: AC

## 2015-05-24 NOTE — ED Provider Notes (Signed)
Patient presents today with symptoms of left big toe swelling and redness with some drainage coming from the nail bed. Patient states that he's had the symptoms for the last few days. He also has had some right knee pain which started last week. He denies any redness warmth or swelling of the area. He denies any fever. He does have a history of gout. He also states that he believes he had a partial anterior cruciate ligament tear when he was younger in high school any played football. He had no surgery to repair this. Denies any history of meniscal tear. At times he feels the knee is tender along the medial aspect and states occasionally feels instability. He denies any clicking or locking of the knee. He has never had a gout attack in the knee. He has taken colchicine for a few days but symptoms in the toe have not improved.  ROS: Negative except mentioned above.  Vitals as per Epic.  GENERAL: NAD RESP: CTA B CARD: RRR MSK: Left Big Toe: +thick yellow nail, mild to moderate erythema of distal digit, tenderness localized to nailbed, minimal discharge expressed, FROM, nv intact  Right Knee: No effusion, no erythema or warmth, tenderness localized to medial joint line, full range of motion, negative McMurray, no laxity with varus or valgus stress, negative Drawer and Lachman NEURO: CN II-XII grossly intact   A/P: 1)L Big Toe Paronychia-keep area clean and dry and seek medical attention if symptoms worsen, will treat patient with Bactrim DS, patient given Indocin prn as well for pain however I do not feel that this represents a true gout attack, I have given him contact information for Dr. Vickki Muff to follow-up with.  2)R Knee Pain- recommend patient follow-up with orthopedics given his history of questional partial anterior cruciate ligament tear and symptoms of medial joint line pain with instability. X-rays and other imaging will be done by orthopedics.  Paulina Fusi, MD 05/24/15 1355

## 2015-05-24 NOTE — ED Notes (Signed)
Right knee pain started last Wednesday and left big toe started hurting this past Saturday.

## 2015-05-25 ENCOUNTER — Encounter: Payer: BC Managed Care – PPO | Admitting: Surgery

## 2015-05-25 DIAGNOSIS — E11622 Type 2 diabetes mellitus with other skin ulcer: Secondary | ICD-10-CM | POA: Diagnosis not present

## 2015-05-25 NOTE — Progress Notes (Addendum)
WYNTON, ELSTAD ([id number]) Visit Report for 05/25/2015 Chief Complaint Document Details Patient Name: Jacob [name], [name] Date of Service: 05/25/2015 2:15 PM Medical Record Number: [id number] Patient Account Number: [id number] Date of Birth/Sex: 17-May-1970 (45 y.o. Male) Treating RN: Macarthur Critchley Primary Care Physician: Gayland Curry Other Clinician: Referring Physician: Gayland Curry Treating Physician/Extender: Frann Rider in Treatment: 10 Information Obtained from: Patient Chief Complaint Patient presents to the wound care center for a consult due non healing wound of the left lower extremity which he has had for about a month and a half. Electronic Signature(s) Signed: 05/25/2015 2:52:44 PM By: Christin Fudge MD, FACS Entered By: Christin Fudge on 05/25/2015 14:52:44 [name] ([id number]) -------------------------------------------------------------------------------- HPI Details Patient Name: Jacob [name], [name] Date of Service: 05/25/2015 2:15 PM Medical Record Number: [id number] Patient Account Number: [id number] Date of Birth/Sex: 03-16-1971 (45 y.o. Male) Treating RN: Macarthur Critchley Primary Care Physician: Gayland Curry Other Clinician: Referring Physician: Gayland Curry Treating Physician/Extender: Frann Rider in Treatment: 10 History of Present Illness Location: left lower extremity laterally Quality: Patient reports experiencing a dull pain to affected area(s). Severity: Patient states wound (s) are getting better. Duration: Patient has had the wound for < 6 weeks prior to presenting for treatment Timing: Pain in wound is Intermittent (comes and goes Context: The wound occurred when the patient had a shave biopsy of a lesion on that leg which was benign Modifying Factors: Other treatment(s) tried include:was put on Keflex and Bactrim by his PCP Associated Signs and Symptoms: Patient reports presence of swelling HPI  Description: Pleasant 45 year old gentleman who had a shave biopsy done from his left lower extremity on October 3 and the wound has not healed since then. This was a benign disease and he was concerned about the length of time it has taken to heal. He has past medical history of diabetes mellitus, gout, hypertension, sleep apnea and is status post tonsillectomy and adenoidectomy, cholecystectomy and uvulopalatopharyngoplasty. he was recently seen by his PCP Dr. Jodi Mourning who had already given him Keflex for a week and then put him on Bactrim for 10 days. the patient was a smoker before and quit in 1995 but currently chews tobacco on a daily basis. 05/04/2015 -- he has been wearing compression stockings which we had ordered from a couple of weeks ago and has been doing his dressing as his advice and overall seems to be doing well. 05/19/2015 -- for some reason when his dressing was removed today he has had a bit of reaction to the bordered foam and hence has some redness around the ulcerated area. He has given up chewing tobacco for the last week. Electronic Signature(s) Signed: 05/25/2015 2:52:48 PM By: Christin Fudge MD, FACS Entered By: Christin Fudge on 05/25/2015 14:52:48 [name] ([id number]) -------------------------------------------------------------------------------- Physical Exam Details Patient Name: Jacob [name], [name] Date of Service: 05/25/2015 2:15 PM Medical Record Number: [id number] Patient Account Number: [id number] Date of Birth/Sex: [birth year] (45 y.o. Male) Treating RN: Macarthur Critchley Primary Care Physician: Gayland Curry Other Clinician: Referring Physician: Gayland Curry Treating Physician/Extender: Frann Rider in Treatment: 10 Constitutional . Pulse regular. Respirations normal and unlabored. Afebrile. . Eyes Nonicteric. Reactive to light. Ears, Nose, Mouth, and Throat Lips, teeth, and gums WNL.Marland Kitchen Moist mucosa without  lesions. Neck supple and nontender. No palpable supraclavicular or cervical adenopathy. Normal sized without goiter. Respiratory WNL. No retractions.. Cardiovascular Pedal Pulses WNL. No clubbing, cyanosis or edema. Lymphatic No adneopathy. No adenopathy. No adenopathy. Musculoskeletal Adexa without tenderness or enlargement.Marland Kitchen  Digits and nails w/o clubbing, cyanosis, infection, petechiae, ischemia, or inflammatory conditions.. Integumentary (Hair, Skin) No suspicious lesions. No crepitus or fluctuance. No peri-wound warmth or erythema. No masses.Marland Kitchen Psychiatric Judgement and insight Intact.. No evidence of depression, anxiety, or agitation.. Notes the wound in the left lower extremity is completely healed and but for some dry skin everything looks excellent Electronic Signature(s) Signed: 05/25/2015 2:53:35 PM By: Christin Fudge MD, FACS Previous Signature: 05/25/2015 2:53:15 PM Version By: Christin Fudge MD, FACS Entered By: Christin Fudge on 05/25/2015 14:53:34 [name] ([id number]) -------------------------------------------------------------------------------- Problem List Details Patient Name: Jacob [name], [name] Date of Service: 05/25/2015 2:15 PM Medical Record Number: [id number] Patient Account Number: [id number] Date of Birth/Sex: 29-Oct-1970 (45 y.o. 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 Date of Service: 05/25/2015 2:15 PM Medical Record Number: [id number] Patient Account Number: [id number] Date of Birth/Sex: 1970/05/22 (45 y.o. Male) Treating RN: Macarthur Critchley Primary Care Physician: Gayland Curry Other Clinician: Referring Physician: Gayland Curry Treating Physician/Extender: Frann Rider in Treatment: 10 Subjective Chief Complaint Information obtained from Patient Patient presents to the wound care center for a consult due non healing wound of the left lower extremity which he has had for about a month and a half. History of Present Illness (HPI) The following HPI elements were documented for the patient's wound: Location: left lower extremity laterally Quality: Patient reports experiencing a dull pain to affected area(s). Severity: Patient states wound (s) are getting better. Duration: Patient has had the wound for < 6 weeks prior to presenting for treatment Timing: Pain in wound is Intermittent (comes and goes Context: The wound occurred when the patient had a shave biopsy of a lesion on that leg which was benign Modifying Factors: Other treatment(s) tried include:was put on Keflex and Bactrim by his PCP Associated Signs and Symptoms: Patient reports presence of swelling Pleasant 45 year old gentleman who had a shave biopsy done from his left lower extremity on October 3 and the wound has not healed since then. This was a benign disease and he was concerned about the length of time it has taken to heal. He has past  medical history of diabetes mellitus, gout, hypertension, sleep apnea and is status post tonsillectomy and adenoidectomy, cholecystectomy and uvulopalatopharyngoplasty. he was recently seen by his PCP Dr. Jodi Mourning who had already given him Keflex for a week and then put him on Bactrim for 10 days. the patient was a smoker before and quit in 1995 but currently chews tobacco on a daily basis. 05/04/2015 -- he has been wearing compression stockings which we had ordered from a couple of weeks ago and has been doing his dressing as his advice and overall seems to be doing well. 05/19/2015 -- for some reason when his dressing was removed today he has had a bit  of reaction to the bordered foam and hence has some redness around the ulcerated area. He has given up chewing tobacco for the last week. Objective Jacob [name], [name] ([id number]) Constitutional Pulse regular. Respirations normal and unlabored. Afebrile. Vitals Time Taken: 2:31 PM, Height: 72 in, Weight: 360 lbs, BMI: 48.8, Temperature: 98.1 F, Pulse: 59 bpm, Blood Pressure: 137/77 mmHg. Eyes Nonicteric. Reactive to light. Ears, Nose, Mouth, and Throat Lips, teeth, and gums WNL.Marland Kitchen Moist mucosa without lesions. Neck supple and nontender. No palpable supraclavicular or cervical adenopathy. Normal sized without goiter. Respiratory WNL. No retractions.. Cardiovascular Pedal Pulses WNL. No clubbing, cyanosis or edema. Lymphatic No adneopathy. No adenopathy. No adenopathy. Musculoskeletal Adexa without tenderness or enlargement.. Digits and nails w/o clubbing, cyanosis, infection, petechiae, ischemia, or inflammatory conditions.Marland Kitchen Psychiatric Judgement and insight Intact.. No evidence of depression, anxiety, or agitation.. General Notes: the wound in the left lower extremity is completely healed and but for some dry skin everything looks excellent Integumentary (Hair, Skin) No suspicious lesions. No crepitus or fluctuance. No  peri-wound warmth or erythema. No masses.. Wound #1 status is Open. Original cause of wound was Surgical Injury. The wound is located on the Left,Lateral Lower Leg. The wound measures 0cm length x 0cm width x 0cm depth; 0cm^2 area and 0cm^3 volume. Wound #1 status is Healed - Epithelialized. Original cause of wound was Surgical Injury. The wound is located on the Left,Lateral Lower Leg. The wound measures 0cm length x 0cm width x 0cm depth; 0cm^2 area and 0cm^3 volume. Jacob [name], Jacob [name] ([id number]) Assessment Active Problems ICD-10 E11.622 - Type 2 diabetes mellitus with other skin ulcer T81.31XA - Disruption of external operation (surgical) wound, not elsewhere classified, initial encounter L97.222 - Non-pressure chronic ulcer of left calf with fat layer exposed F17.228 - Nicotine dependence, chewing tobacco, with other nicotine-induced disorders E66.01 - Morbid (severe) obesity due to excess calories His wound is healed and we have discussed care of skin including moisturizing lightly and continuing with his compression stockings and staying off tobacco. He is discharged from the wound care services and be seen back as needed. Plan His wound is healed and we have discussed care of skin including moisturizing lightly and continuing with his compression stockings and staying off tobacco. He is discharged from the wound care services and be seen back as needed. Electronic Signature(s) Signed: 05/26/2015 10:50:35 AM By: Christin Fudge MD, FACS Previous Signature: 05/25/2015 2:54:14 PM Version By: Christin Fudge MD, FACS Entered By: Christin Fudge on 05/26/2015 10:50:35 [name] ([id number]) -------------------------------------------------------------------------------- SuperBill Details Patient Name: Jacob [name], Kyal A. Date of Service: 05/25/2015 Medical Record Number: [id number] Patient Account Number: [id number] Date of Birth/Sex: Sep 10, 1970 (45 y.o. Male) Treating RN:  Macarthur Critchley Primary Care Physician: Gayland Curry Other Clinician: Referring Physician: Gayland Curry Treating Physician/Extender: Frann Rider in Treatment: 10 Diagnosis Coding ICD-10 Codes Code Description [phone] Type 2 diabetes mellitus with other skin ulcer Disruption of external operation (surgical) wound, not elsewhere classified, initial T81.31XA encounter L97.222 Non-pressure chronic ulcer of left calf with fat layer exposed F17.228 Nicotine dependence, chewing tobacco, with other nicotine-induced disorders E66.01 Morbid (severe) obesity due to excess calories Facility Procedures CPT4 Code: [id number] Description: 99213 - WOUND CARE VISIT-LEV 3 EST PT Modifier: Quantity: 1 Physician Procedures CPT4: Description Modifier Quantity Code [id number] - WC PHYS LEVEL 2 - EST PT 1 ICD-10 Description Diagnosis E11.622 Type 2 diabetes mellitus with other skin ulcer T81.31XA Disruption of external operation (surgical) wound, not elsewhere classified,  initial encounter L97.222 Non-pressure chronic  ulcer of left calf with fat layer exposed F17.228 Nicotine dependence, chewing tobacco, with other nicotine-induced disorders Electronic Signature(s) Signed: 05/25/2015 4:27:41 PM By: Christin Fudge MD, FACS Signed: 05/25/2015 4:29:37 PM By: Rebecca Eaton RN, [place] Previous Signature: 05/25/2015 2:54:29 PM Version By: Christin Fudge MD, FACS Entered By: Rebecca Eaton RN, [place] on 05/25/2015 15:03:15

## 2015-05-26 NOTE — Progress Notes (Signed)
GRAYLON, AMIE ([id number]) Visit Report for 05/25/2015 Arrival Information Details Patient Name: Jacob [name], [name] Date of Service: 05/25/2015 2:15 PM Medical Record Number: [id number] Patient Account Number: [id number] Date of Birth/Sex: 1970/08/22 (45 y.o. Male) Treating RN: Macarthur Critchley Primary Care Physician: Gayland Curry Other Clinician: Referring Physician: Gayland Curry Treating Physician/Extender: Frann Rider in Treatment: 10 Visit Information History Since Last Visit All ordered tests and consults were completed: No Patient Arrived: Ambulatory Added or deleted any medications: No Arrival Time: 14:29 Any new allergies or adverse reactions: No Accompanied By: self Had a fall or experienced change in No Transfer Assistance: None activities of daily living that may affect Patient Identification Verified: Yes risk of falls: Secondary Verification Process Yes Signs or symptoms of abuse/neglect since last No Completed: visito Patient Requires Transmission-Based No Hospitalized since last visit: No Precautions: Has Dressing in Place as Prescribed: No Patient Has Alerts: Yes Pain Present Now: No Patient Alerts: DMII Notes took dressing off this morning when taking bath Electronic Signature(s) Signed: 05/25/2015 4:29:37 PM By: Rebecca Eaton, RN, Sendra Entered By: Rebecca Eaton RN, Sendra on 05/25/2015 14:37:39 Jacob [name] ([id number]) -------------------------------------------------------------------------------- Clinic Level of Care Assessment Details Patient Name: Jacob [name], [name] Date of Service: 05/25/2015 2:15 PM Medical Record Number: [id number] Patient Account Number: [id number] Date of Birth/Sex: 12-Aug-1970 (45 y.o. Male) Treating RN: Macarthur Critchley Primary Care Physician: Gayland Curry Other Clinician: Referring Physician: Gayland Curry Treating Physician/Extender: Frann Rider in Treatment: 10 Clinic Level of Care  Assessment Items TOOL 4 Quantity Score X - Use when only an EandM is performed on FOLLOW-UP visit 1 0 ASSESSMENTS - Nursing Assessment / Reassessment X - Reassessment of Co-morbidities (includes updates in patient status) 1 10 X - Reassessment of Adherence to Treatment Plan 1 5 ASSESSMENTS - Wound and Skin Assessment / Reassessment X - Simple Wound Assessment / Reassessment - one wound 1 5 []  - Complex Wound Assessment / Reassessment - multiple wounds 0 []  - Dermatologic / Skin Assessment (not related to wound area) 0 ASSESSMENTS - Focused Assessment X - Circumferential Edema Measurements - multi extremities 1 5 []  - Nutritional Assessment / Counseling / Intervention 0 X - Lower Extremity Assessment (monofilament, tuning fork, pulses) 1 5 []  - Peripheral Arterial Disease Assessment (using hand held doppler) 0 ASSESSMENTS - Ostomy and/or Continence Assessment and Care []  - Incontinence Assessment and Management 0 []  - Ostomy Care Assessment and Management (repouching, etc.) 0 PROCESS - Coordination of Care X - Simple Patient / Family Education for ongoing care 1 15 []  - Complex (extensive) Patient / Family Education for ongoing care 0 X - Staff obtains Programmer, systems, Records, Test Results / Process Orders 1 10 []  - Staff telephones HHA, Nursing Homes / Clarify orders / etc 0 []  - Routine Transfer to another Facility (non-emergent condition) 0 Jacob [name], [name] ([id number]) []  - Routine Hospital Admission (non-emergent condition) 0 []  - New Admissions / Biomedical engineer / Ordering NPWT, Apligraf, etc. 0 []  - Emergency Hospital Admission (emergent condition) 0 X - Simple Discharge Coordination 1 10 []  - Complex (extensive) Discharge Coordination 0 PROCESS - Special Needs []  - Pediatric / Minor Patient Management 0 []  - Isolation Patient Management 0 []  - Hearing / Language / Visual special needs 0 []  - Assessment of Community assistance (transportation, D/C planning, etc.) 0 []  -  Additional assistance / Altered mentation 0 []  - Support Surface(s) Assessment (bed, cushion, seat, etc.) 0 INTERVENTIONS - Wound Cleansing / Measurement X - Simple Wound Cleansing - one wound  1 5 []  - Complex Wound Cleansing - multiple wounds 0 X - Wound Imaging (photographs - any number of wounds) 1 5 []  - Wound Tracing (instead of photographs) 0 X - Simple Wound Measurement - one wound 1 5 []  - Complex Wound Measurement - multiple wounds 0 INTERVENTIONS - Wound Dressings []  - Small Wound Dressing one or multiple wounds 0 []  - Medium Wound Dressing one or multiple wounds 0 []  - Large Wound Dressing one or multiple wounds 0 []  - Application of Medications - topical 0 []  - Application of Medications - injection 0 INTERVENTIONS - Miscellaneous []  - External ear exam 0 Jacob [name], [name] ([id number]) []  - Specimen Collection (cultures, biopsies, blood, body fluids, etc.) 0 []  - Specimen(s) / Culture(s) sent or taken to Lab for analysis 0 []  - Patient Transfer (multiple staff / Harrel Lemon Lift / Similar devices) 0 []  - Simple Staple / Suture removal (25 or less) 0 []  - Complex Staple / Suture removal (26 or more) 0 []  - Hypo / Hyperglycemic Management (close monitor of Blood Glucose) 0 []  - Ankle / Brachial Index (ABI) - do not check if billed separately 0 X - Vital Signs 1 5 Has the patient been seen at the hospital within the last three years: Yes Total Score: 85 Level Of Care: New/Established - Level 3 Electronic Signature(s) Signed: 05/25/2015 4:29:37 PM By: Rebecca Eaton, RN, Sendra Entered By: Rebecca Eaton RN, Sendra on 05/25/2015 15:03:06 Jacob [name] ([id number]) -------------------------------------------------------------------------------- Encounter Discharge Information Details Patient Name: Jacob [name], [name] Date of Service: 05/25/2015 2:15 PM Medical Record Number: [id number] Patient Account Number: [id number] Date of Birth/Sex: [birth year] (45 y.o. Male) Treating RN:  Macarthur Critchley Primary Care Physician: Gayland Curry Other Clinician: Referring Physician: Gayland Curry Treating Physician/Extender: Frann Rider in Treatment: 10 Encounter Discharge Information Items Discharge Pain Level: 0 Discharge Condition: Stable Ambulatory Status: Ambulatory Discharge Destination: Home Private Transportation: Auto Schedule Follow-up Appointment: No Medication Reconciliation completed and Yes provided to Patient/Care Jacob [name]: Clinical Summary of Care: Notes discharged Electronic Signature(s) Signed: 05/25/2015 4:29:37 PM By: Rebecca Eaton RN, Sendra Entered By: Rebecca Eaton RN, Sendra on 05/25/2015 14:42:00 Jacob [name] ([id number]) -------------------------------------------------------------------------------- Lower Extremity Assessment Details Patient Name: Jacob [name], [name] Date of Service: 05/25/2015 2:15 PM Medical Record Number: [id number] Patient Account Number: [id number] Date of Birth/Sex: 10-30-1970 (45 y.o. Male) Treating RN: Macarthur Critchley Primary Care Physician: Gayland Curry Other Clinician: Referring Physician: Gayland Curry Treating Physician/Extender: Frann Rider in Treatment: 10 Vascular Assessment Pulses: Posterior Tibial Dorsalis Pedis Palpable: [Left:Yes] Extremity colors, hair growth, and conditions: Extremity Color: [Left:Normal] Hair Growth on Extremity: [Left:Yes] Temperature of Extremity: [Left:Warm] Capillary Refill: [Left:< 3 seconds] Dependent Rubor: [Left:No] Blanched when Elevated: [Left:No] Lipodermatosclerosis: [Left:No] Toe Nail Assessment Left: Right: Thick: No Discolored: No Deformed: No Improper Length and Hygiene: No Electronic Signature(s) Signed: 05/25/2015 4:29:37 PM By: Rebecca Eaton, RN, Sendra Entered By: Rebecca Eaton RN, Sendra on 05/25/2015 14:32:33 Jacob [name]  ([id number]) -------------------------------------------------------------------------------- [place] Details Patient Name: Jacob [name], [name] Date of Service: 05/25/2015 2:15 PM Medical Record Number: [id number] Patient Account Number: [id number] Date of Birth/Sex: [birth year] (45 y.o. Male) Treating RN: Macarthur Critchley Primary Care Physician: Gayland Curry Other Clinician: Referring Physician: Gayland Curry Treating Physician/Extender: Frann Rider in Treatment: 10 Active Inactive Electronic Signature(s) Signed: 05/25/2015 4:29:37 PM By: Rebecca Eaton RN, Sendra Entered By: Rebecca Eaton RN, Sendra on 05/25/2015 15:02:36 Jacob [name] ([id number]) -------------------------------------------------------------------------------- Pain Assessment Details Patient Name: Jacob [name], [name] Date of Service: 05/25/2015 2:15 PM Medical Record Number: [id number] Patient Account  Number: [id number] Date of Birth/Sex: 1970/05/10 (45 y.o. Male) Treating RN: Macarthur Critchley Primary Care Physician: Gayland Curry Other Clinician: Referring Physician: Gayland Curry Treating Physician/Extender: Frann Rider in Treatment: 10 Active Problems Location of Pain Severity and Description of Pain Patient Has Paino No Site Locations Rate the pain. Current Pain Level: 0 Pain Management and Medication Current Pain Management: Electronic Signature(s) Signed: 05/25/2015 4:29:37 PM By: Rebecca Eaton, RN, Sendra Entered By: Rebecca Eaton RN, Sendra on 05/25/2015 14:30:49 Jacob [name] ([id number]) -------------------------------------------------------------------------------- Patient/Caregiver Education Details Patient Name: Jacob [name], [name] Date of Service: 05/25/2015 2:15 PM Medical Record Number: [id number] Patient Account Number: [id number] Date of Birth/Gender: June 01, 1970 (45 y.o. Male) Treating RN: Macarthur Critchley Primary Care Physician: Gayland Curry Other Clinician: Referring Physician: Gayland Curry Treating Physician/Extender: Frann Rider in Treatment: 10 Education Assessment Education Provided To: Patient Education Topics Provided Venous: Handouts: Other: compression hose therapy Methods: Explain/Verbal Responses: State content correctly Wound/Skin Impairment: Handouts: Skin Care Do's and Dont's Methods: Explain/Verbal Responses: State content correctly Electronic Signature(s) Signed: 05/25/2015 4:29:37 PM By: Rebecca Eaton, RN, Sendra Entered By: Rebecca Eaton RN, Sendra on 05/25/2015 14:36:42 Jacob [name] ([id number]) -------------------------------------------------------------------------------- Wound Assessment Details Patient Name: Jacob [name], [name] Date of Service: 05/25/2015 2:15 PM Medical Record Number: [id number] Patient Account Number: [id number] Date of Birth/Sex: 05-25-1970 (45 y.o. Male) Treating RN: Macarthur Critchley Primary Care Physician: Gayland Curry Other Clinician: Referring Physician: Gayland Curry Treating Physician/Extender: Frann Rider in Treatment: 10 Wound Status Wound Number: 1 Primary Etiology: Open Surgical Wound Wound Location: Left, Lateral Lower Leg Wound Status: Open Wounding Event: Surgical Injury Date Acquired: 01/30/2015 Weeks Of Treatment: 10 Clustered Wound: No Wound Measurements Length: (cm) Width: (cm) Depth: (cm) Area: (cm) Volume: (cm) 0 % Reduction in Area: 100% 0 % Reduction in Volume: 100% 0 0 0 Wound Description Full Thickness Without Exposed Classification: Support Structures Periwound Skin Texture Texture Color No Abnormalities Noted: No No Abnormalities Noted: No Moisture No Abnormalities Noted: No Electronic Signature(s) Signed: 05/25/2015 4:29:37 PM By: Rebecca Eaton, RN, Sendra Entered By: Rebecca Eaton RN, Sendra on 05/25/2015 14:35:44 Jacob [name]  ([id number]) -------------------------------------------------------------------------------- Wound Assessment Details Patient Name: Jacob [name], [name] Date of Service: 05/25/2015 2:15 PM Medical Record Number: [id number] Patient Account Number: [id number] Date of Birth/Sex: 31-Oct-1970 (45 y.o. Male) Treating RN: Macarthur Critchley Primary Care Physician: Gayland Curry Other Clinician: Referring Physician: Gayland Curry Treating Physician/Extender: Frann Rider in Treatment: 10 Wound Status Wound Number: 1 Primary Open Surgical Wound Etiology: Wound Location: Left Lower Leg - Lateral Wound Status: Healed - Epithelialized Wounding Event: Surgical Injury Comorbid Sleep Apnea, Hypertension, Type II Date Acquired: 01/30/2015 History: Diabetes, Gout Weeks Of Treatment: 10 Clustered Wound: No Photos Wound Measurements Length: (cm) 0 % Reduction i Width: (cm) 0 % Reduction i Depth: (cm) 0 Area: (cm) 0 Volume: (cm) 0 n Area: 100% n Volume: 100% Wound Description Full Thickness Without Classification: Exposed Support Structures Diabetic Severity Grade 0 (Wagner): Periwound Skin Texture Texture Color No Abnormalities Noted: No No Abnormalities Noted: No Moisture No Abnormalities Noted: No Electronic Signature(s) BRISCO, BOLEYN ([id number]) Signed: 05/25/2015 4:29:37 PM By: Rebecca Eaton RN, Sendra Entered By: Rebecca Eaton RN, Sendra on 05/25/2015 15:23:02 Jacob [name] ([id number]) -------------------------------------------------------------------------------- Vitals Details Patient Name: Jacob [name], [name] Date of Service: 05/25/2015 2:15 PM Medical Record Number: [id number] Patient Account Number: [id number] Date of Birth/Sex: 02-Nov-1970 (45 y.o. Male) Treating RN: Macarthur Critchley Primary Care Physician: Gayland Curry Other Clinician: Referring Physician: Gayland Curry Treating Physician/Extender: Frann Rider in Treatment: 10 Vital  Signs Time  Taken: 14:31 Temperature (F): 98.1 Height (in): 72 Pulse (bpm): 59 Weight (lbs): 360 Blood Pressure (mmHg): 137/77 Body Mass Index (BMI): 48.8 Reference Range: 80 - 120 mg / dl Electronic Signature(s) Signed: 05/25/2015 4:29:37 PM By: Rebecca Eaton RN, Sendra Entered By: Rebecca Eaton RN, Sendra on 05/25/2015 14:31:08

## 2015-07-17 ENCOUNTER — Other Ambulatory Visit
Admission: RE | Admit: 2015-07-17 | Discharge: 2015-07-17 | Disposition: A | Discharge status: Home / Self Care | Payer: BC Managed Care – PPO | Source: Ambulatory Visit | Attending: Unknown Physician Specialty | Admitting: Unknown Physician Specialty

## 2015-07-17 DIAGNOSIS — M25561 Pain in right knee: Secondary | ICD-10-CM | POA: Insufficient documentation

## 2015-07-17 LAB — SYNOVIAL FLUID, CRYSTAL: Crystals, Fluid: NONE SEEN

## 2015-07-18 ENCOUNTER — Other Ambulatory Visit: Payer: Self-pay | Admitting: Unknown Physician Specialty

## 2015-07-18 DIAGNOSIS — M25561 Pain in right knee: Secondary | ICD-10-CM

## 2015-07-31 ENCOUNTER — Ambulatory Visit (HOSPITAL_COMMUNITY)
Admission: RE | Admit: 2015-07-31 | Discharge: 2015-07-31 | Disposition: A | Discharge status: Home / Self Care | Payer: BC Managed Care – PPO | Source: Ambulatory Visit | Attending: Unknown Physician Specialty | Admitting: Unknown Physician Specialty

## 2015-07-31 DIAGNOSIS — S83241A Other tear of medial meniscus, current injury, right knee, initial encounter: Secondary | ICD-10-CM | POA: Diagnosis not present

## 2015-07-31 DIAGNOSIS — M25561 Pain in right knee: Secondary | ICD-10-CM | POA: Diagnosis not present

## 2015-07-31 DIAGNOSIS — X58XXXA Exposure to other specified factors, initial encounter: Secondary | ICD-10-CM | POA: Diagnosis not present

## 2015-08-03 DIAGNOSIS — L97911 Non-pressure chronic ulcer of unspecified part of right lower leg limited to breakdown of skin: Secondary | ICD-10-CM | POA: Insufficient documentation

## 2015-08-08 DIAGNOSIS — E66813 Obesity, class 3: Secondary | ICD-10-CM | POA: Insufficient documentation

## 2015-08-11 ENCOUNTER — Ambulatory Visit
Admission: EM | Admit: 2015-08-11 | Discharge: 2015-08-11 | Disposition: A | Discharge status: Home / Self Care | Payer: BC Managed Care – PPO | Attending: Family Medicine | Admitting: Family Medicine

## 2015-08-11 ENCOUNTER — Encounter: Payer: Self-pay | Admitting: *Deleted

## 2015-08-11 DIAGNOSIS — J40 Bronchitis, not specified as acute or chronic: Secondary | ICD-10-CM

## 2015-08-11 LAB — RAPID INFLUENZA A&B ANTIGENS
Influenza A (ARMC): NEGATIVE
Influenza B (ARMC): NEGATIVE

## 2015-08-11 LAB — RAPID STREP SCREEN (MED CTR MEBANE ONLY): STREPTOCOCCUS, GROUP A SCREEN (DIRECT): NEGATIVE

## 2015-08-11 MED ORDER — HYDROCOD POLST-CPM POLST ER 10-8 MG/5ML PO SUER
5.0000 mL | Freq: Every evening | ORAL | Status: DC | PRN
Start: 2015-08-11 — End: 2016-02-13

## 2015-08-11 MED ORDER — AZITHROMYCIN 250 MG PO TABS: ORAL_TABLET | ORAL | Status: DC

## 2015-08-11 MED ORDER — LIDOCAINE VISCOUS 2 % MT SOLN: 15.0000 mL | Freq: Three times a day (TID) | OROMUCOSAL | Status: DC | PRN

## 2015-08-11 MED ORDER — BENZONATATE 100 MG PO CAPS: 100.0000 mg | ORAL_CAPSULE | Freq: Three times a day (TID) | ORAL | Status: DC | PRN

## 2015-08-11 NOTE — ED Provider Notes (Signed)
[facility]  ____________________________________________  Time seen: Approximately 3:32 PM  I have reviewed the triage vital signs and the nursing notes.   HISTORY  Chief Complaint Cough; Sore Throat; Headache; Fever; and Chills  HPI Jacob [name] is a 45 y.o. male presents for the complaints of runny nose, nasal congestion, sore throat and cough 6 days. Patient reports that he's felt like he has had some intermittent fever but has not checked it. Reports some sick contacts at work. Denies home sick contacts. Reports continues to eat and drink well. States frequently blowing his nose and getting thick greenish mucus out. Also reports that he is coughing, with occasionally production of greenish mucus. States his sinuses feel clogged. Denies seasonal allergy history.  States sore throat is very scratchy and irritated. Denies pain eating and drinking. States sinuses feel clogged with achy pressure. Patient reports that the cough is during the day and at nighttime. Patient states that the cough woke him up multiple times at night. Patient reports that he was seen by his primary care physician's office on Monday and states that he was told it was likely a viral illness and was given Decadron. Patient states that the Decadron was for 3 days but did not help his sore throat.  Denies chest pain, shortness of breath, wheezing, abdominal pain, dysuria, neck pain, back pain, extremity pain or swelling, fall, injury, dizziness or weakness.  PCP: Astrid Divine   Past Medical History  Diagnosis Date  . Gout   . Diabetes ([place])   . Hypertension   . Depression   . Sleep apnea     uses cpap    Patient Active Problem List   Diagnosis Date Noted  . Non-pressure chronic ulcer of left ankle, limited to breakdown of skin ([place]) 04/25/2015  . HYPERTENSION 02/22/2008  . SLEEP APNEA 02/22/2008  . PITUITARY NEOPLASM, HX OF 02/22/2008    Past Surgical History  Procedure Laterality Date  .  Tonsillectomy and adenoidectomy    . Uvulopalatopharyngoplasty    . Cholecystectomy      Current Outpatient Rx  Name  Route  Sig  Dispense  Refill  . allopurinol (ZYLOPRIM) 100 MG tablet   Oral   Take 100 mg by mouth daily.         Marland Kitchen FLUoxetine (PROZAC) 20 MG capsule   Oral   Take 20 mg by mouth daily.         Marland Kitchen lisinopril (PRINIVIL,ZESTRIL) 5 MG tablet   Oral   Take 5 mg by mouth daily.         . metFORMIN (GLUMETZA) 500 MG (MOD) 24 hr tablet   Oral   Take 500 mg by mouth daily with breakfast.         .           .           . colchicine 0.6 MG tablet   Oral   Take 0.6 mg by mouth 2 (two) times daily.         . fluocinonide (LIDEX) 0.05 % external solution   Topical   Apply 1 application topically as needed.         . indomethacin (INDOCIN) 25 MG capsule   Oral   Take 1 capsule (25 mg total) by mouth 3 (three) times daily as needed.   30 capsule   0   . ketoconazole (NIZORAL) 2 % cream   Topical   Apply 1 application topically 2 (two) times  daily as needed for irritation.         . nicotine (NICODERM CQ - DOSED IN MG/24 HOURS) 21 mg/24hr patch   Transdermal   Place 21 mg onto the skin daily.         . nicotine polacrilex (NICORETTE) 2 MG gum   Oral   Take 2 mg by mouth as needed for smoking cessation.           Allergies Hydrocortisone  Family History  Problem Relation Age of Onset  . Lung cancer Father     Social History Social History  Substance Use Topics  . Smoking status: Former Smoker -- 1.00 packs/day for 10 years    Types: Cigarettes    Quit date: 04/29/1993  . Smokeless tobacco: Former Systems developer    Types: [place] date: 05/12/2015  . Alcohol Use: 0.0 oz/week    0 Standard drinks or equivalent per week     Comment: rarely    Review of Systems Constitutional: As above. Eyes: No visual changes. ENT:As above. Cardiovascular: Denies chest pain. Respiratory: Denies shortness of breath. Gastrointestinal: No abdominal  pain.  No nausea, no vomiting.  No diarrhea.  No constipation. Genitourinary: Negative for dysuria. Musculoskeletal: Negative for back pain. Skin: Negative for rash. Neurological: Negative for headaches, focal weakness or numbness.  10-point ROS otherwise negative.  ____________________________________________   PHYSICAL EXAM:  VITAL SIGNS: ED Triage Vitals  Enc Vitals Group     BP 08/11/15 1433 161/88 mmHg     Pulse Rate 08/11/15 1433 67     Resp 08/11/15 1433 16     Temp 08/11/15 1433 98 F (36.7 C)     Temp Source 08/11/15 1433 Oral     SpO2 08/11/15 1433 97 %     Weight 08/11/15 1433 362 lb (164.202 kg)     Height 08/11/15 1433 6' (1.829 m)     Head Cir --      Peak Flow --      Pain Score --      Pain Loc --      Pain Edu? --      Excl. in [place]? --   Constitutional: Alert and oriented. Well appearing and in no acute distress. Eyes: Conjunctivae are normal. PERRL. EOMI. Head: Atraumatic.No tenderness to palpation bilateral frontal or maxillary sinuses. No swelling. No erythema.   Ears: no erythema, normal TMs bilaterally.   Nose: nasal congestion with bilateral nasal turbinate erythema and edema.   Mouth/Throat: Mucous membranes are moist.  Oropharynx non-erythematous.No tonsillar swelling or exudate.  Neck: No stridor.  No cervical spine tenderness to palpation. Hematological/Lymphatic/Immunilogical: No cervical lymphadenopathy. Cardiovascular: Normal rate, regular rhythm. Grossly normal heart sounds.  Good peripheral circulation. Respiratory: Normal respiratory effort.  No retractions. Lungs CTAB. No wheezes, rales or rhonchi. Good air movement. Dry intermittent cough noted in room with very mild bronchospasm with cough. Gastrointestinal: Soft and nontender. Obese abdomen. Musculoskeletal: No lower or upper extremity tenderness nor edema.  Bilateral pedal pulses equal and easily palpated. No cervical, thoracic or lumbar tenderness to palpation.  Neurologic:  Normal  speech and language. No gross focal neurologic deficits are appreciated. No gait instability. Skin:  Skin is warm, dry and intact. No rash noted. Psychiatric: Mood and affect are normal. Speech and behavior are normal.  ____________________________________________   LABS (all labs ordered are listed, but only abnormal results are displayed)  Labs Reviewed  RAPID INFLUENZA A&B ANTIGENS ([facility] ONLY)  RAPID STREP SCREEN (NOT AT  [facility])  CULTURE, GROUP A STREP [facility])   _  INITIAL IMPRESSION / ASSESSMENT AND PLAN / ED COURSE  Pertinent labs & imaging results that were available during my care of the patient were reviewed by me and considered in my medical decision making (see chart for details).  Presents for the complaints of 6 days of runny nose, nasal congestion, sore throat, cough and chills. States cough is nonproductive as well as nasal drainage thick and green. Lungs clear throughout. Slight bronchospasm noted with cough only. Some sinus tenderness with drainage. Suspect sinusitis and bronchitis. Strep negative, will culture. Influenza negative. Will treat patient with oral azithromycin, when necessary Tessalon Perles, when necessary Tussionex at night and Viscous Lidocaine gargles. Encouraged rest, fluids, PCP follow up. Discussed indication, risks and benefits of medications with patient.  Discussed follow up with Primary care physician this week. Discussed follow up and return parameters including no resolution or any worsening concerns. Patient verbalized understanding and agreed to plan.   ____________________________________________   FINAL CLINICAL IMPRESSION(S) / ED DIAGNOSES  Final diagnoses:  Bronchitis      Note: This dictation was prepared with Dragon dictation along with smaller phrase technology. Any transcriptional errors that result from this process are unintentional.     Marylene Land, NP 08/11/15 1640

## 2015-08-11 NOTE — Discharge Instructions (Signed)
Take medication as prescribed. Rest. Drink plenty of fluids.  ° °Follow up with your primary care physician this week as needed. Return to Urgent care for new or worsening concerns.  ° °

## 2015-08-11 NOTE — ED Notes (Signed)
Productive cough- yellow/green, sore throat, fever, headache, chills, onset Sunday. Was seen at [facility] on Tuesday and dx with a viral illness and rx Dexamethasone. Pt states no improvement.

## 2015-08-13 LAB — CULTURE, GROUP A STREP (THRC)

## 2015-08-16 ENCOUNTER — Encounter
Admission: RE | Admit: 2015-08-16 | Discharge: 2015-08-16 | Disposition: A | Discharge status: Home / Self Care | Payer: BC Managed Care – PPO | Source: Ambulatory Visit | Attending: Unknown Physician Specialty | Admitting: Unknown Physician Specialty

## 2015-08-16 DIAGNOSIS — Z01812 Encounter for preprocedural laboratory examination: Secondary | ICD-10-CM | POA: Insufficient documentation

## 2015-08-16 HISTORY — DX: Anxiety disorder, unspecified: F41.9

## 2015-08-16 HISTORY — DX: Cardiac murmur, unspecified: R01.1

## 2015-08-16 HISTORY — DX: Obesity, unspecified: E66.9

## 2015-08-16 HISTORY — DX: Benign neoplasm of pituitary gland: D35.2

## 2015-08-16 LAB — CBC
HCT: 38.2 % — ABNORMAL LOW (ref 40.0–52.0)
HEMOGLOBIN: 13.5 g/dL (ref 13.0–18.0)
MCH: 30.6 pg (ref 26.0–34.0)
MCHC: 35.4 g/dL (ref 32.0–36.0)
MCV: 86.4 fL (ref 80.0–100.0)
Platelets: 206 10*3/uL (ref 150–440)
RBC: 4.43 MIL/uL (ref 4.40–5.90)
RDW: 13.4 % (ref 11.5–14.5)
WBC: 8.8 10*3/uL (ref 3.8–10.6)

## 2015-08-16 LAB — BASIC METABOLIC PANEL
Anion gap: 9 (ref 5–15)
BUN: 13 mg/dL (ref 6–20)
CO2: 24 mmol/L (ref 22–32)
CREATININE: 0.72 mg/dL (ref 0.61–1.24)
Calcium: 8.5 mg/dL — ABNORMAL LOW (ref 8.9–10.3)
Chloride: 107 mmol/L (ref 101–111)
GFR calc Af Amer: >60 mL/min (ref >60)
GFR calc non Af Amer: >60 mL/min (ref >60)
Glucose, Bld: 148 mg/dL — ABNORMAL HIGH (ref 65–99)
Potassium: 3.8 mmol/L (ref 3.5–5.1)
SODIUM: 140 mmol/L (ref 135–145)

## 2015-08-16 NOTE — Pre-Procedure Instructions (Signed)
EKG (08-03-15) . obtained from PCP called to Dr. Laureen Abrahams and stated "he should be ok".

## 2015-08-16 NOTE — Patient Instructions (Signed)
  Your procedure is scheduled on: August 23, 2015 (Wednesday) Report to Day Surgery.([facility]) SECOND FLOOR To find out your arrival time please call [phone] between 1PM - 3PM on August 22, 2015 (Tuesday).  Remember: Instructions that are not followed completely may result in serious medical risk, up to and including death, or upon the discretion of your surgeon and anesthesiologist your surgery may need to be rescheduled.    __x__ 1. Do not eat food or drink liquids after midnight. No gum chewing or hard candies.     __x__ 2. No Alcohol for 24 hours before or after surgery.   ____ 3. Bring all medications with you on the day of surgery if instructed.    __x__ 4. Notify your doctor if there is any change in your medical condition     (cold, fever, infections).     Do not wear jewelry, make-up, hairpins, clips or nail polish.  Do not wear lotions, powders, or perfumes. You may wear deodorant.  Do not shave 48 hours prior to surgery. Men may shave face and neck.  Do not bring valuables to the hospital.    [facility] is not responsible for any belongings or valuables.               Contacts, dentures or bridgework may not be worn into surgery.  Leave your suitcase in the car. After surgery it may be brought to your room.  For patients admitted to the hospital, discharge time is determined by your                treatment team.   Patients discharged the day of surgery will not be allowed to drive home.   Please read over the following fact sheets that you were given:   Surgical Site Infection Prevention   _x___ Take these medicines the morning of surgery with A SIP OF WATER:    1. Lisinopril  2. Prozac  3.   4.  5.  6.  ____ Fleet Enema (as directed)   __x__ Use CHG Soap as directed  ____ Use inhalers on the day of surgery  __x__ Stop metformin 2 days prior to surgery (STOP METFORMIN ON April 24)  ___ Take 1/2 of usual insulin dose the night before surgery and  none on the morning of surgery.   __x_ Stop Coumadin/Plavix/aspirin on (N/A)  _x___ Stop Anti-inflammatories on (NO NSAIDS) TYLENOL OK TO TAKE FOR PAIN IF NEEDED   ____ Stop supplements until after surgery.    _x___ Bring C-Pap to the hospital.

## 2015-08-23 ENCOUNTER — Ambulatory Visit
Admission: RE | Admit: 2015-08-23 | Discharge: 2015-08-23 | Disposition: A | Discharge status: Home / Self Care | Payer: BC Managed Care – PPO | Source: Ambulatory Visit | Attending: Unknown Physician Specialty | Admitting: Unknown Physician Specialty

## 2015-08-23 ENCOUNTER — Encounter
Admission: RE | Disposition: A | Discharge status: Home / Self Care | Payer: Self-pay | Source: Ambulatory Visit | Attending: Unknown Physician Specialty

## 2015-08-23 ENCOUNTER — Ambulatory Visit: Payer: BC Managed Care – PPO | Admitting: Anesthesiology

## 2015-08-23 ENCOUNTER — Encounter: Payer: Self-pay | Admitting: *Deleted

## 2015-08-23 DIAGNOSIS — Z888 Allergy status to other drugs, medicaments and biological substances status: Secondary | ICD-10-CM | POA: Diagnosis not present

## 2015-08-23 DIAGNOSIS — I10 Essential (primary) hypertension: Secondary | ICD-10-CM | POA: Diagnosis not present

## 2015-08-23 DIAGNOSIS — Z8249 Family history of ischemic heart disease and other diseases of the circulatory system: Secondary | ICD-10-CM | POA: Insufficient documentation

## 2015-08-23 DIAGNOSIS — M25561 Pain in right knee: Secondary | ICD-10-CM | POA: Diagnosis present

## 2015-08-23 DIAGNOSIS — E669 Obesity, unspecified: Secondary | ICD-10-CM | POA: Diagnosis not present

## 2015-08-23 DIAGNOSIS — Z808 Family history of malignant neoplasm of other organs or systems: Secondary | ICD-10-CM | POA: Diagnosis not present

## 2015-08-23 DIAGNOSIS — G4730 Sleep apnea, unspecified: Secondary | ICD-10-CM | POA: Diagnosis not present

## 2015-08-23 DIAGNOSIS — Z79899 Other long term (current) drug therapy: Secondary | ICD-10-CM | POA: Insufficient documentation

## 2015-08-23 DIAGNOSIS — Z801 Family history of malignant neoplasm of trachea, bronchus and lung: Secondary | ICD-10-CM | POA: Insufficient documentation

## 2015-08-23 DIAGNOSIS — M109 Gout, unspecified: Secondary | ICD-10-CM | POA: Insufficient documentation

## 2015-08-23 DIAGNOSIS — S83241A Other tear of medial meniscus, current injury, right knee, initial encounter: Secondary | ICD-10-CM | POA: Insufficient documentation

## 2015-08-23 DIAGNOSIS — E119 Type 2 diabetes mellitus without complications: Secondary | ICD-10-CM | POA: Diagnosis not present

## 2015-08-23 DIAGNOSIS — Z6841 Body Mass Index (BMI) 40.0 and over, adult: Secondary | ICD-10-CM | POA: Insufficient documentation

## 2015-08-23 DIAGNOSIS — Z7984 Long term (current) use of oral hypoglycemic drugs: Secondary | ICD-10-CM | POA: Diagnosis not present

## 2015-08-23 DIAGNOSIS — D352 Benign neoplasm of pituitary gland: Secondary | ICD-10-CM | POA: Insufficient documentation

## 2015-08-23 DIAGNOSIS — X58XXXA Exposure to other specified factors, initial encounter: Secondary | ICD-10-CM | POA: Insufficient documentation

## 2015-08-23 DIAGNOSIS — Z7951 Long term (current) use of inhaled steroids: Secondary | ICD-10-CM | POA: Insufficient documentation

## 2015-08-23 HISTORY — PX: KNEE ARTHROSCOPY: SHX127

## 2015-08-23 LAB — GLUCOSE, CAPILLARY
Glucose-Capillary: 157 mg/dL — ABNORMAL HIGH (ref 65–99)
Glucose-Capillary: 166 mg/dL — ABNORMAL HIGH (ref 65–99)

## 2015-08-23 SURGERY — ARTHROSCOPY, KNEE
Anesthesia: General | Site: Knee | Laterality: Right | Wound class: Clean

## 2015-08-23 MED ORDER — HYDROCODONE-ACETAMINOPHEN 5-325 MG PO TABS
ORAL_TABLET | ORAL | Status: DC
Start: 2015-08-23 — End: 2015-08-23
  Filled 2015-08-23: qty 1

## 2015-08-23 MED ORDER — KETOROLAC TROMETHAMINE 30 MG/ML IJ SOLN
INTRAMUSCULAR | Status: DC | PRN
  Administered 2015-08-23: 30 mg via INTRAVENOUS

## 2015-08-23 MED ORDER — HYDROCODONE-ACETAMINOPHEN 5-325 MG PO TABS
ORAL_TABLET | ORAL | Status: AC
  Filled 2015-08-23: qty 1

## 2015-08-23 MED ORDER — HYDROCODONE-ACETAMINOPHEN 5-325 MG PO TABS
1.0000 | ORAL_TABLET | Freq: Four times a day (QID) | ORAL | Status: DC | PRN
  Administered 2015-08-23: 1 via ORAL

## 2015-08-23 MED ORDER — DEXAMETHASONE SODIUM PHOSPHATE 10 MG/ML IJ SOLN
INTRAMUSCULAR | Status: DC | PRN
  Administered 2015-08-23: 5 mg via INTRAVENOUS

## 2015-08-23 MED ORDER — ONDANSETRON HCL 4 MG/2ML IJ SOLN
INTRAMUSCULAR | Status: DC | PRN
  Administered 2015-08-23: 4 mg via INTRAVENOUS

## 2015-08-23 MED ORDER — GLYCOPYRROLATE 0.2 MG/ML IJ SOLN
INTRAMUSCULAR | Status: DC | PRN
  Administered 2015-08-23: 0.2 mg via INTRAVENOUS

## 2015-08-23 MED ORDER — ACETAMINOPHEN 10 MG/ML IV SOLN
INTRAVENOUS | Status: DC | PRN
  Administered 2015-08-23: 1000 mg via INTRAVENOUS

## 2015-08-23 MED ORDER — KETAMINE HCL 50 MG/ML IJ SOLN
INTRAMUSCULAR | Status: DC | PRN
Start: 2015-08-23 — End: 2015-08-23
  Administered 2015-08-23: 25 mg via INTRAMUSCULAR

## 2015-08-23 MED ORDER — ONDANSETRON HCL 4 MG/2ML IJ SOLN: 4.0000 mg | Freq: Once | INTRAMUSCULAR | Status: DC | PRN

## 2015-08-23 MED ORDER — FAMOTIDINE 20 MG PO TABS
ORAL_TABLET | ORAL | Status: AC
  Administered 2015-08-23: 20 mg via ORAL
  Filled 2015-08-23: qty 1

## 2015-08-23 MED ORDER — LIDOCAINE HCL (CARDIAC) 20 MG/ML IV SOLN
INTRAVENOUS | Status: DC | PRN
  Administered 2015-08-23: 100 mg via INTRAVENOUS

## 2015-08-23 MED ORDER — ALBUTEROL SULFATE HFA 108 (90 BASE) MCG/ACT IN AERS
INHALATION_SPRAY | RESPIRATORY_TRACT | Status: DC | PRN
  Administered 2015-08-23: 12 via RESPIRATORY_TRACT

## 2015-08-23 MED ORDER — SODIUM CHLORIDE 0.9 % IV SOLN
INTRAVENOUS | Status: DC
  Administered 2015-08-23 (×2): via INTRAVENOUS

## 2015-08-23 MED ORDER — FENTANYL CITRATE (PF) 100 MCG/2ML IJ SOLN
25.0000 ug | INTRAMUSCULAR | Status: AC | PRN
  Administered 2015-08-23 (×6): 25 ug via INTRAVENOUS

## 2015-08-23 MED ORDER — FENTANYL CITRATE (PF) 100 MCG/2ML IJ SOLN
INTRAMUSCULAR | Status: AC
  Administered 2015-08-23: 25 ug via INTRAVENOUS
  Filled 2015-08-23: qty 2

## 2015-08-23 MED ORDER — BUPIVACAINE HCL (PF) 0.5 % IJ SOLN
INTRAMUSCULAR | Status: AC
  Filled 2015-08-23: qty 30

## 2015-08-23 MED ORDER — BUPIVACAINE HCL (PF) 0.5 % IJ SOLN
INTRAMUSCULAR | Status: DC | PRN
  Administered 2015-08-23: 20 mL

## 2015-08-23 MED ORDER — ACETAMINOPHEN 10 MG/ML IV SOLN
INTRAVENOUS | Status: AC
  Filled 2015-08-23: qty 100

## 2015-08-23 MED ORDER — HYDROCODONE-ACETAMINOPHEN 5-325 MG PO TABS
ORAL_TABLET | ORAL | Status: AC
  Administered 2015-08-23: 1 via ORAL
  Filled 2015-08-23: qty 1

## 2015-08-23 MED ORDER — FENTANYL CITRATE (PF) 100 MCG/2ML IJ SOLN
INTRAMUSCULAR | Status: DC | PRN
  Administered 2015-08-23 (×2): 50 ug via INTRAVENOUS
  Administered 2015-08-23 (×2): 25 ug via INTRAVENOUS

## 2015-08-23 MED ORDER — SUCCINYLCHOLINE CHLORIDE 20 MG/ML IJ SOLN
INTRAMUSCULAR | Status: DC | PRN
  Administered 2015-08-23 (×2): 100 mg via INTRAVENOUS

## 2015-08-23 MED ORDER — NORCO 5-325 MG PO TABS: 1.0000 | ORAL_TABLET | Freq: Four times a day (QID) | ORAL | Status: DC | PRN

## 2015-08-23 MED ORDER — FAMOTIDINE 20 MG PO TABS
20.0000 mg | ORAL_TABLET | Freq: Once | ORAL | Status: AC
Start: 2015-08-23 — End: 2015-08-23
  Administered 2015-08-23: 20 mg via ORAL

## 2015-08-23 MED ORDER — PROPOFOL 10 MG/ML IV BOLUS
INTRAVENOUS | Status: DC | PRN
  Administered 2015-08-23: 300 mg via INTRAVENOUS
  Administered 2015-08-23: 100 mg via INTRAVENOUS
  Administered 2015-08-23: 50 mg via INTRAVENOUS

## 2015-08-23 SURGICAL SUPPLY — 38 items
ARTHROWAND PARAGON T2 (SURGICAL WAND)
BLADE ABRADER 4.5 (BLADE) ×3 IMPLANT
BLADE FULL RADIUS 3.5 (BLADE) ×3 IMPLANT
BLADE SHAVER 4.5X7 STR FR (MISCELLANEOUS) ×3 IMPLANT
BNDG ESMARK 6X12 TAN STRL LF (GAUZE/BANDAGES/DRESSINGS) ×3 IMPLANT
BUR ABRADER 4.0 W/FLUTE AQUA (MISCELLANEOUS) IMPLANT
BURR ABRADER 4.0 W/FLUTE AQUA (MISCELLANEOUS)
CHLORAPREP W/TINT 26ML (MISCELLANEOUS) ×3 IMPLANT
DRAPE LEGGINS SURG 28X43 STRL (DRAPES) ×3 IMPLANT
GAUZE SPONGE 4X4 12PLY STRL (GAUZE/BANDAGES/DRESSINGS) ×3 IMPLANT
GLOVE BIO SURGEON STRL SZ7.5 (GLOVE) ×3 IMPLANT
GLOVE BIO SURGEON STRL SZ8 (GLOVE) ×3 IMPLANT
GLOVE INDICATOR 8.0 STRL GRN (GLOVE) ×3 IMPLANT
GOWN STRL REUS W/ TWL LRG LVL3 (GOWN DISPOSABLE) ×1 IMPLANT
GOWN STRL REUS W/TWL LRG LVL3 (GOWN DISPOSABLE) ×2
GOWN STRL REUS W/TWL LRG LVL4 (GOWN DISPOSABLE) ×3 IMPLANT
IV LACTATED RINGER IRRG 3000ML (IV SOLUTION) ×4
IV LR IRRIG 3000ML ARTHROMATIC (IV SOLUTION) ×2 IMPLANT
KIT RM TURNOVER STRD PROC AR (KITS) ×3 IMPLANT
MANIFOLD 4PT FOR NEPTUNE1 (MISCELLANEOUS) ×3 IMPLANT
PACK ARTHROSCOPY KNEE (MISCELLANEOUS) ×3 IMPLANT
PADDING CAST 4IN STRL (MISCELLANEOUS)
PADDING CAST BLEND 4X4 STRL (MISCELLANEOUS) IMPLANT
SET TUBE SUCT SHAVER OUTFL 24K (TUBING) ×3 IMPLANT
SET TUBE TIP INTRA-ARTICULAR (MISCELLANEOUS) ×3 IMPLANT
SOL PREP PVP 2OZ (MISCELLANEOUS) ×3
SOLUTION PREP PVP 2OZ (MISCELLANEOUS) ×1 IMPLANT
SUT ETH BLK MONO 3 0 FS 1 12/B (SUTURE) ×3 IMPLANT
SUT ETHILON 3-0 FS-10 30 BLK (SUTURE) ×3
SUTURE EHLN 3-0 FS-10 30 BLK (SUTURE) ×1 IMPLANT
TAPE MICROFOAM 4IN (TAPE) ×3 IMPLANT
TUBING ARTHRO INFLOW-ONLY STRL (TUBING) ×3 IMPLANT
WAND 30 DEG SABER W/CORD (SURGICAL WAND) IMPLANT
WAND ARTHRO PARAGON T2 (SURGICAL WAND) IMPLANT
WAND COVAC 50 IFS (MISCELLANEOUS) IMPLANT
WAND HAND CNTRL MULTIVAC 50 (MISCELLANEOUS) ×3 IMPLANT
WAND HAND CNTRL MULTIVAC 90 (MISCELLANEOUS) IMPLANT
WRAP KNEE W/COLD PACKS 25.5X14 (SOFTGOODS) ×3 IMPLANT

## 2015-08-23 NOTE — H&P (Signed)
  H and P reviewed. No changes. Uploaded at later date. 

## 2015-08-23 NOTE — Op Note (Signed)
Patient: Jacob [name], Jacob [name]  Preoperative diagnosis: Torn medial meniscus plus bilateral femoral chondral lesion  Postop diagnosis: Torn medial meniscus plus medial and lateral femoral chondral lesions  Operation: Arthroscopic partial medial meniscectomy plus debridement and microfracture of a medial femoral chondral lesion and a lateral femoral chondral lesion  Surgeon: Vilinda Flake, MD  Anesthesia: Gen.   History: Patient's had a long history of right knee pain.  The plain films revealed no significant joint space narrowing .  The patient had an MRI which revealed torn medial meniscus plus lateral femoral chondral lesion along with some early chondral changes in the medial compartment.The patient was scheduled for surgery due to persistent discomfort despite conservative treatment.  The patient was taken the operating room where satisfactory general anesthesia was achieved. A tourniquet and leg holder were was applied to the right thigh. A well leg support was applied to the nonoperative extremity. The right knee was prepped and draped in usual fashion for an arthroscopic procedure. An inflow cannula was introduced superomedially. The joint was distended with lactated Ringer's. Scope was introduced through an inferolateral puncture wound and a probe through an inferomedial puncture wound. Inspection of the medial compartment revealed  a posterior root tear of the medial meniscus along with a grade 3, almost grade 4 medial femoral chondral lesion. The lesion was located in the mid weight-bearing portion of the medial femoral condyle and measured about 8 mm in width and probably 12 mm in length.I resected the torn portion of the medial meniscus using a combination of basket biters and motorized resector. The remaining rim was contoured with an angled ArthroCare thermal wand. I debrided the calcified cartilage layer of the medial femoral chondral lesion and then microfractured the lesion with a 45  angled awl. Inspection of the intercondylar notch revealed intact cruciates. Inspection of the the lateral compartment revealed about an 8 mm in diameter grade 3, almost grade 4 chondral lesion in the mid weight-bearing portion of the lateral femoral condyle.. This lesion was also abraded and microfractured with a 30 angled awl.  Trochlear groove was inspected and appeared to be fairly smooth.  Retropatellar surface was also smooth. IThe patella seemed to track fairly well.  The instruments were removed from the joint at this time. The puncture wounds were closed with 3-0 nylon in vertical mattress fashion. I injected each puncture wound with several cc of half percent Marcaine without epinephrine. Betadine was applied the wounds followed by sterile dressing. An ice pack was applied to the right knee. The patient was awakened and transferred to the stretcher bed. The patient was taken to the recovery room in satisfactory condition.  The tourniquet was not inflated during the course of the procedure. Blood loss was negligible.

## 2015-08-23 NOTE — Anesthesia Postprocedure Evaluation (Signed)
Anesthesia Post Note  Patient: Jacob [name]  Procedure(s) Performed: Procedure(s) (LRB): ARTHROSCOPY KNEE, partial medial and lateral menisectomy, medial and lateral chondolar microfracture. (Right)  Patient location during evaluation: PACU Anesthesia Type: General Level of consciousness: awake and alert Pain management: pain level controlled Vital Signs Assessment: post-procedure vital signs reviewed and stable Respiratory status: spontaneous breathing, nonlabored ventilation, respiratory function stable and patient connected to nasal cannula oxygen Cardiovascular status: blood pressure returned to baseline and stable Postop Assessment: no signs of nausea or vomiting Anesthetic complications: no    Last Vitals:  Filed Vitals:   08/23/15 1442 08/23/15 1502  BP: 150/83 164/89  Pulse: 65 89  Temp:  36.3 C  Resp: 17 13    Last Pain:  Filed Vitals:   08/23/15 1505  PainSc: 3                  Martha Clan

## 2015-08-23 NOTE — Anesthesia Procedure Notes (Signed)
Procedure Name: Intubation Performed by: Demetrius Charity Pre-anesthesia Checklist: Patient identified, Patient being monitored, Timeout performed, Emergency Drugs available and Suction available Patient Re-evaluated:Patient Re-evaluated prior to inductionOxygen Delivery Method: Circle system utilized Preoxygenation: Pre-oxygenation with 100% oxygen Intubation Type: IV induction Ventilation: Mask ventilation with difficulty, Two handed mask ventilation required and Oral airway inserted - appropriate to patient size Laryngoscope Size: 4 and Glidescope Grade View: Grade III Tube type: Oral Tube size: 7.0 mm Number of attempts: 2 Airway Equipment and Method: Rigid stylet and Video-laryngoscopy Placement Confirmation: ETT inserted through vocal cords under direct vision,  positive ETCO2 and breath sounds checked- equal and bilateral Secured at: 24 cm Tube secured with: Tape Dental Injury: Teeth and Oropharynx as per pre-operative assessment  Difficulty Due To: Difficult Airway- due to anterior larynx and Difficult Airway- due to reduced neck mobility Future Recommendations: Recommend- induction with short-acting agent, and alternative techniques readily available

## 2015-08-23 NOTE — Discharge Instructions (Signed)
[facility]  Kathrene Alu., M.D. [phone]   KNEE ARTHROSCOPY POST OPERATION INSTRUCTIONS:  PLEASE READ THESE INSTRUCTIONS ABOUT POST OPERATION CARE. THEY WILL ANSWER MOST OF YOUR QUESTIONS.  You have been given a prescription for pain. Please take as directed for pain.  You can walk, keeping the knee slightly stiff-avoid doing too much bending the first day. (if ACL reconstruction is performed, keep brace locked in extension when walking.)  You will use crutches or cane if needed. Can weight bear as tolerated  Plan to take three to four days off from work. You can resume work when you are comfortable. (This can be a week or more, depending on the type of work you do.)  To reduce pain and swelling, place one to two pillows under the knee the first two or three days when sitting or lying. An ice pack may be placed on top of the area over the dressing. Instructions for making homemade icepack are as follow:  Flexible homemade alcohol water ice pack  2 cups water  1 cup rubbing alcohol  food coloring for the blue tint (optional)  2 zip-top bags - gallon-size  Mix the water and alcohol together in one of your zip-top bags and add food coloring. Release as much air as possible and seal the bag. Place in freezer for at least 12 hours.  The small incisions in your knee are closed with nylon stitches. They will be removed in the office.  The bulky dressing may be removed in the third day after surgery. (If ACL surgery-DO NOT REMOVE BANDAGES). Put a waterproof band-aid over each stitch. Do not put any creams or ointments on wounds. You may shower at this time, but change waterproof band-aids after showering. KEEP INCISIONS CLEAN AND DRY UNTIL YOU RETURN TO THE OFFICE.  Sometimes the operative area remains somewhat painful and swollen for several weeks. This is usually nothing to worry about, but call if you have any excessive symptoms, especially  fever. It is not unusual to have a low grade fever of 99 degrees for the first few days. If persist after 3-4 days call the office. It is not uncommon for the pain to be a little worse on the third day after surgery.  Begin doing gentle exercises right away. They will be limited by the amount of pain and swelling you have.  Exercising will reduce the swelling, increase motion, and prevent muscle weakness. Exercises: Straight leg raising and gentle knee bending.  Take 81 milligram aspirin twice a day for 2 weeks after meals or milk. This along with elevation will help reduce the possibility of phlebitis in your operated leg.  Avoid strenuous athletics for a minimum of 4 to 6 weeks after arthroscopic surgery (approximately five months if ACL surgery).  If the surgery included ACL reconstruction the brace that is supplied to the extremity post surgery is to be locked in extension when you are asleep and is to be locked in extension when you are ambulating. It can be unlocked for exercises or sitting.  Keep your post surgery appointment that has been made for you. If you do not remember the date call [phone]. Your follow up appointment should be between 7-10 days.       AMBULATORY SURGERY  DISCHARGE INSTRUCTIONS   The drugs that you were given will stay in your system until tomorrow so for the next 24 hours you should not:  Drive an automobile Make any legal  decisions Drink any alcoholic beverage   You may resume regular meals tomorrow.  Today it is better to start with liquids and gradually work up to solid foods.  You may eat anything you prefer, but it is better to start with liquids, then soup and crackers, and gradually work up to solid foods.   Please notify your doctor immediately if you have any unusual bleeding, trouble breathing, redness and pain at the surgery site, drainage, fever, or pain not relieved by medication.   Your post-operative visit with Dr.                                       is: Date:                        Time:    Please call to schedule your post-operative visit.  Additional Instructions:

## 2015-08-23 NOTE — Anesthesia Preprocedure Evaluation (Signed)
Anesthesia Evaluation  Patient identified by MRN, date of birth, ID band Patient awake    Reviewed: Allergy & Precautions, H&P , NPO status , Patient's Chart, lab work & pertinent test results, reviewed documented beta blocker date and time   History of Anesthesia Complications Negative for: history of anesthetic complications  Airway Mallampati: II  TM Distance: >3 FB Neck ROM: full    Dental  (+) Chipped, Caps, Teeth Intact Bottom left central incisor is chipped from a previous fall.:   Pulmonary neg shortness of breath, sleep apnea and Continuous Positive Airway Pressure Ventilation , neg COPD, neg recent URI, former smoker,    Pulmonary exam normal breath sounds clear to auscultation       Cardiovascular Exercise Tolerance: Good hypertension, (-) angina(-) CAD, (-) Past MI, (-) Cardiac Stents and (-) CABG Normal cardiovascular exam(-) dysrhythmias + Valvular Problems/Murmurs  Rhythm:regular Rate:Normal     Neuro/Psych neg Seizures PSYCHIATRIC DISORDERS (Depression) Non-secreting pituitary adenoma, stable size since 1999.  Patient does note some decreased peripheral vision that has been stable, but no other symptoms.    GI/Hepatic negative GI ROS, Neg liver ROS,   Endo/Other  diabetes, Oral Hypoglycemic AgentsMorbid obesity  Renal/GU Renal disease (kidney stones)  negative genitourinary   Musculoskeletal   Abdominal   Peds  Hematology negative hematology ROS (+)   Anesthesia Other Findings Past Medical History:   Gout                                                         Diabetes (HCC)                                               Hypertension                                                 Depression                                                   Sleep apnea                                                    Comment:uses cpap   Anxiety                                                      Heart murmur                                                    Obesity                                                      Pituitary adenoma (HCC)                                      Reproductive/Obstetrics negative OB ROS                             Anesthesia Physical  Anesthesia Plan  ASA: III  Anesthesia Plan: General   Post-op Pain Management:    Induction:   Airway Management Planned:   Additional Equipment:   Intra-op Plan:   Post-operative Plan:   Informed Consent: I have reviewed the patients History and Physical, chart, labs and discussed the procedure including the risks, benefits and alternatives for the proposed anesthesia with the patient or authorized representative who has indicated his/her understanding and acceptance.   Dental Advisory Given  Plan Discussed with: Anesthesiologist, CRNA and Surgeon  Anesthesia Plan Comments:         Anesthesia Quick Evaluation  

## 2015-08-23 NOTE — Transfer of Care (Signed)
Immediate Anesthesia Transfer of Care Note  Patient: Jacob [name]  Procedure(s) Performed: Procedure(s): ARTHROSCOPY KNEE, partial medial and lateral menisectomy, medial and lateral chondolar microfracture. (Right)  Patient Location: PACU  Anesthesia Type:General  Level of Consciousness: awake, alert , oriented and patient cooperative  Airway & Oxygen Therapy: Patient Spontanous Breathing and Patient connected to face mask oxygen  Post-op Assessment: Report given to RN, Post -op Vital signs reviewed and stable and Patient moving all extremities  Post vital signs: Reviewed and stable  Last Vitals:  Filed Vitals:   08/23/15 1044 08/23/15 1342  BP: 143/74 162/87  Pulse: 64 76  Temp: 36.6 C 36.4 C  Resp: 16 20    Last Pain:  Filed Vitals:   08/23/15 1343  PainSc: 6          Complications: No apparent anesthesia complications

## 2015-08-24 ENCOUNTER — Encounter: Payer: Self-pay | Admitting: Unknown Physician Specialty

## 2015-11-06 ENCOUNTER — Ambulatory Visit
Admission: EM | Admit: 2015-11-06 | Discharge: 2015-11-06 | Disposition: A | Discharge status: Home / Self Care | Payer: BC Managed Care – PPO | Attending: Family Medicine | Admitting: Family Medicine

## 2015-11-06 ENCOUNTER — Encounter: Payer: Self-pay | Admitting: *Deleted

## 2015-11-06 DIAGNOSIS — S8392XA Sprain of unspecified site of left knee, initial encounter: Secondary | ICD-10-CM | POA: Diagnosis not present

## 2015-11-06 MED ORDER — HYDROCODONE-ACETAMINOPHEN 5-325 MG PO TABS: ORAL_TABLET | ORAL | Status: DC

## 2015-11-06 NOTE — ED Notes (Signed)
Patient reports that his left knee started hurting him 1 week ago. Patient had his right knee repaired 3 months ago and thinks that his left knee is hurting due to compensating for right knee.

## 2015-11-06 NOTE — ED Provider Notes (Signed)
CSN: [id number]     Arrival date & time 11/06/15  1252 History   None    Chief Complaint  Patient presents with  . Knee Pain   (Consider location/radiation/quality/duration/timing/severity/associated sxs/prior Treatment) HPI Comments: 45 yo male with a 1 week h/o left knee pain and swelling. Patient denies any falls, injuries, trauma, fevers, chills, redness, skin lesions. States had right knee surgery several months ago and since then had been compensating with the left knee (putting more weight and pressure).   Patient is a 45 y.o. male presenting with knee pain. The history is provided by the patient.  Knee Pain   Past Medical History  Diagnosis Date  . Gout   . Diabetes ([place])   . Hypertension   . Depression   . Sleep apnea     uses cpap  . Anxiety   . Heart murmur   . Obesity   . Pituitary adenoma [facility])    Past Surgical History  Procedure Laterality Date  . Tonsillectomy and adenoidectomy    . Uvulopalatopharyngoplasty    . Cholecystectomy    . Tonsillectomy    . Wisdom tooth extraction    . Knee arthroscopy Right 08/23/2015    Procedure: ARTHROSCOPY KNEE, partial medial and lateral menisectomy, medial and lateral chondolar microfracture.;  Surgeon: Leanor Kail, MD;  Location: [facility] ORS;  Service: Orthopedics;  Laterality: Right;   Family History  Problem Relation Age of Onset  . Lung cancer Father    Social History  Substance Use Topics  . Smoking status: Former Smoker -- 1.00 packs/day for 10 years    Types: Cigarettes    Quit date: 04/29/1993  . Smokeless tobacco: Former Systems developer    Types: [place] date: 05/12/2015  . Alcohol Use: 0.0 oz/week    0 Standard drinks or equivalent per week     Comment: rarely    Review of Systems  Allergies  Hydrocortisone  Home Medications   Prior to Admission medications   Medication Sig Start Date End Date Taking? Authorizing Provider  allopurinol (ZYLOPRIM) 100 MG tablet Take 100 mg by mouth daily.   Yes  Historical Provider, MD  colchicine 0.6 MG tablet Take 0.6 mg by mouth daily.    Yes Historical Provider, MD  FLUoxetine (PROZAC) 20 MG capsule Take 20 mg by mouth daily.   Yes Historical Provider, MD  ketoconazole (NIZORAL) 2 % cream Apply 1 application topically 2 (two) times daily as needed for irritation.   Yes Historical Provider, MD  lisinopril (PRINIVIL,ZESTRIL) 5 MG tablet Take 5 mg by mouth daily.   Yes Historical Provider, MD  metFORMIN (GLUMETZA) 500 MG (MOD) 24 hr tablet Take 500 mg by mouth daily with breakfast.   Yes Historical Provider, MD  benzonatate (TESSALON PERLES) 100 MG capsule Take 1 capsule (100 mg total) by mouth 3 (three) times daily as needed for cough. 08/11/15   Marylene Land, NP  chlorpheniramine-HYDROcodone [facility] PENNKINETIC ER) 10-8 MG/5ML SUER Take 5 mLs by mouth at bedtime as needed for cough (do not drive or operate machinery while taking as can cause drowsiness.). do not drive or operate machinery while taking as can cause drowsiness. 08/11/15   Marylene Land, NP  fluocinonide (LIDEX) 0.05 % external solution Apply 1 application topically as needed.    Historical Provider, MD  HYDROcodone-acetaminophen (NORCO/VICODIN) 5-325 MG tablet 1-2 tabs po q 8 hours prn 11/06/15   Norval Gable, MD  indomethacin (INDOCIN) 25 MG capsule Take 1 capsule (25 mg total)  by mouth 3 (three) times daily as needed. 05/24/15   Paulina Fusi, MD  lidocaine (XYLOCAINE) 2 % solution Use as directed 15 mLs in the mouth or throat every 8 (eight) hours as needed (sore throat. gargle and spit as needed for sore throat.). 08/11/15   Marylene Land, NP  nicotine (NICODERM CQ - DOSED IN MG/24 HOURS) 21 mg/24hr patch Place 21 mg onto the skin as needed.     Historical Provider, MD  nicotine polacrilex (NICORETTE) 2 MG gum Take 2 mg by mouth as needed for smoking cessation.    Historical Provider, MD   Meds Ordered and Administered this Visit  Medications - No data to display  BP 150/81 mmHg   Pulse 70  Temp(Src) 98 F (36.7 C) (Oral)  Resp 18  Ht 6' (1.829 m)  Wt 365 lb (165.563 kg)  BMI 49.49 kg/m2  SpO2 98% No data found.   Physical Exam  Constitutional: He appears well-developed and well-nourished. No distress.  Musculoskeletal:       Left knee: He exhibits swelling. He exhibits normal range of motion, no ecchymosis, no deformity, no laceration, no erythema, normal alignment, no LCL laxity, normal patellar mobility, no bony tenderness, normal meniscus and no MCL laxity. Tenderness found. Lateral joint line tenderness noted.  Skin: He is not diaphoretic.  Nursing note and vitals reviewed.   ED Course  Procedures (including critical care time)  Labs Review Labs Reviewed - No data to display  Imaging Review No results found.   Visual Acuity Review  Right Eye Distance:   Left Eye Distance:   Bilateral Distance:    Right Eye Near:   Left Eye Near:    Bilateral Near:         MDM   1. Knee sprain, left, initial encounter    Discharge Medication List as of 11/06/2015  3:12 PM     1.diagnosis reviewed with patient 2. rx as per orders above; reviewed possible side effects, interactions, risks and benefits; rx for vicodin as per orders 3. Recommend supportive treatment with rest, ice, elevation 4. Follow-up with his orthopedist as needed for further evaluation     Norval Gable, MD 11/06/15 1614

## 2015-11-06 NOTE — Discharge Instructions (Signed)

## 2015-12-11 ENCOUNTER — Other Ambulatory Visit: Payer: Self-pay | Admitting: Unknown Physician Specialty

## 2015-12-11 DIAGNOSIS — G8929 Other chronic pain: Secondary | ICD-10-CM

## 2015-12-11 DIAGNOSIS — M25562 Pain in left knee: Principal | ICD-10-CM

## 2015-12-15 ENCOUNTER — Ambulatory Visit: Payer: BC Managed Care – PPO

## 2016-01-12 ENCOUNTER — Ambulatory Visit (HOSPITAL_COMMUNITY): Payer: BC Managed Care – PPO

## 2016-01-14 ENCOUNTER — Ambulatory Visit
Admission: EM | Admit: 2016-01-14 | Discharge: 2016-01-14 | Disposition: A | Discharge status: Home / Self Care | Payer: BC Managed Care – PPO | Attending: Family Medicine | Admitting: Family Medicine

## 2016-01-14 ENCOUNTER — Encounter: Payer: Self-pay | Admitting: Gynecology

## 2016-01-14 DIAGNOSIS — J40 Bronchitis, not specified as acute or chronic: Secondary | ICD-10-CM

## 2016-01-14 DIAGNOSIS — J0110 Acute frontal sinusitis, unspecified: Secondary | ICD-10-CM | POA: Diagnosis not present

## 2016-01-14 DIAGNOSIS — R05 Cough: Secondary | ICD-10-CM

## 2016-01-14 DIAGNOSIS — R059 Cough, unspecified: Secondary | ICD-10-CM

## 2016-01-14 MED ORDER — AZITHROMYCIN 250 MG PO TABS: ORAL_TABLET | ORAL | 0 refills | Status: DC

## 2016-01-14 MED ORDER — PREDNISONE 10 MG (21) PO TBPK: 10.0000 mg | ORAL_TABLET | Freq: Every day | ORAL | 0 refills | Status: DC

## 2016-01-14 NOTE — ED Provider Notes (Signed)
CSN: [id number]     Arrival date & time 01/14/16  1019 History   First MD Initiated Contact with Patient 01/14/16 1058     Chief Complaint  Patient presents with  . Cough   (Consider location/radiation/quality/duration/timing/severity/associated sxs/prior Treatment) Pt was seen at his pcp last week and was given hydrocodone cough meds for bronchitis and he is not feeling any better. Began also having sx of facial pressure more so on the rt side nasal drainage thick green in color. Cough is productive at times with clear/white in color. Not taking anything else for sx. Denies any chest pain, no sob, alert x4 sx worse at night.       Past Medical History:  Diagnosis Date  . Anxiety   . Depression   . Diabetes ([place])   . Gout   . Heart murmur   . Hypertension   . Obesity   . Pituitary adenoma ([place])   . Sleep apnea    uses cpap   Past Surgical History:  Procedure Laterality Date  . CHOLECYSTECTOMY    . KNEE ARTHROSCOPY Right 08/23/2015   Procedure: ARTHROSCOPY KNEE, partial medial and lateral menisectomy, medial and lateral chondolar microfracture.;  Surgeon: Leanor Kail, MD;  Location: [facility] ORS;  Service: Orthopedics;  Laterality: Right;  . TONSILLECTOMY    . TONSILLECTOMY AND ADENOIDECTOMY    . UVULOPALATOPHARYNGOPLASTY    . WISDOM TOOTH EXTRACTION     Family History  Problem Relation Age of Onset  . Lung cancer Father    Social History  Substance Use Topics  . Smoking status: Former Smoker    Packs/day: 1.00    Years: 10.00    Types: Cigarettes    Quit date: 04/29/1993  . Smokeless tobacco: Former Systems developer    Types: Chew    Quit date: 05/12/2015  . Alcohol use 0.0 oz/week     Comment: rarely    Review of Systems  Constitutional: Negative.   HENT: Positive for postnasal drip, rhinorrhea, sinus pressure and sore throat.   Eyes: Negative.   Respiratory: Positive for cough.   Cardiovascular: Negative.   Gastrointestinal: Negative.   Musculoskeletal: Negative.    Neurological: Negative.     Allergies  Hydrocortisone  Home Medications   Prior to Admission medications   Medication Sig Start Date End Date Taking? Authorizing Provider  allopurinol (ZYLOPRIM) 100 MG tablet Take 100 mg by mouth daily.   Yes Historical Provider, MD  aspirin EC 81 MG tablet Take 81 mg by mouth daily.   Yes Historical Provider, MD  colchicine 0.6 MG tablet Take 0.6 mg by mouth daily.    Yes Historical Provider, MD  fluocinonide (LIDEX) 0.05 % external solution Apply 1 application topically as needed.   Yes Historical Provider, MD  FLUoxetine (PROZAC) 20 MG capsule Take 20 mg by mouth daily.   Yes Historical Provider, MD  fluticasone (FLONASE) 50 MCG/ACT nasal spray Place into both nostrils daily.   Yes Historical Provider, MD  indomethacin (INDOCIN) 25 MG capsule Take 1 capsule (25 mg total) by mouth 3 (three) times daily as needed. 05/24/15  Yes Paulina Fusi, MD  ketoconazole (NIZORAL) 2 % cream Apply 1 application topically 2 (two) times daily as needed for irritation.   Yes Historical Provider, MD  lisinopril (PRINIVIL,ZESTRIL) 5 MG tablet Take 5 mg by mouth daily.   Yes Historical Provider, MD  metFORMIN (GLUMETZA) 500 MG (MOD) 24 hr tablet Take 500 mg by mouth daily with breakfast.   Yes Historical Provider, MD  azithromycin (ZITHROMAX Z-PAK) 250 MG tablet Take as prescribed 01/14/16   Melanee Left, NP  benzonatate (TESSALON PERLES) 100 MG capsule Take 1 capsule (100 mg total) by mouth 3 (three) times daily as needed for cough. 08/11/15   Marylene Land, NP  chlorpheniramine-HYDROcodone [facility] PENNKINETIC ER) 10-8 MG/5ML SUER Take 5 mLs by mouth at bedtime as needed for cough (do not drive or operate machinery while taking as can cause drowsiness.). do not drive or operate machinery while taking as can cause drowsiness. 08/11/15   Marylene Land, NP  HYDROcodone-acetaminophen (NORCO/VICODIN) 5-325 MG tablet 1-2 tabs po q 8 hours prn 11/06/15   Norval Gable, MD   lidocaine (XYLOCAINE) 2 % solution Use as directed 15 mLs in the mouth or throat every 8 (eight) hours as needed (sore throat. gargle and spit as needed for sore throat.). 08/11/15   Marylene Land, NP  nicotine (NICODERM CQ - DOSED IN MG/24 HOURS) 21 mg/24hr patch Place 21 mg onto the skin as needed.     Historical Provider, MD  nicotine polacrilex (NICORETTE) 2 MG gum Take 2 mg by mouth as needed for smoking cessation.    Historical Provider, MD  predniSONE (STERAPRED UNI-PAK 21 TAB) 10 MG (21) TBPK tablet Take 1 tablet (10 mg total) by mouth daily. Take 6 tabs by mouth daily  for 2 days, then 5 tabs for 2 days, then 4 tabs for 2 days, then 3 tabs for 2 days, 2 tabs for 2 days, then 1 tab by mouth daily for 2 days 01/14/16   Melanee Left, NP   Meds Ordered and Administered this Visit  Medications - No data to display  BP (!) 160/73 (BP Location: Left Arm)   Pulse 63   Temp (!) 96.8 F (36 C) (Tympanic)   Resp 18   Ht 6' (1.829 m)   Wt (!) 365 lb (165.6 kg)   SpO2 98%   BMI 49.50 kg/m  No data found.   Physical Exam  Constitutional: He appears well-developed and well-nourished.  Eyes: Pupils are equal, round, and reactive to light.    Urgent Care Course   Clinical Course    Procedures (including critical care time)  Labs Review Labs Reviewed - No data to display  Imaging Review No results found.   Visual Acuity Review  Right Eye Distance:   Left Eye Distance:   Bilateral Distance:    Right Eye Near:   Left Eye Near:    Bilateral Near:         MDM   1. Bronchitis   2. Cough   3. Acute frontal sinusitis, recurrence not specified     Pt denies any reaction to prednisone states that he has taken this in the past.  Take full dose of ABX  May use humidifier and nasal spray to help with nasal pressure. May take tylenol and motrin prn for pain. Cough may continue for 2 weeks post treatment. Can use an over the counter cough meds but avoid any with DS.   Pt states that he has not take his BP meds this morning yet but will take when he leaves.    Melanee Left, NP 01/14/16 1116

## 2016-01-14 NOTE — ED Triage Notes (Signed)
Patient c/o cough over 1 week. Per patient was seen by his PCP on Monday, prescribe cough syrup but not feeling any better.

## 2016-02-06 DIAGNOSIS — M2392 Unspecified internal derangement of left knee: Secondary | ICD-10-CM | POA: Insufficient documentation

## 2016-02-13 NOTE — Pre-Procedure Instructions (Signed)
ECG 12-lead4/09/2015 [place] Component Name Value Ref Range  Vent Rate (bpm) 60   PR Interval (msec) 210   QRS Interval (msec) 94   QT Interval (msec) 420   QTc (msec) 420   Result Narrative  Sinus rhythm 1st degree AV block Baseline artifact  Otherwise normal ECG  When compared with ECG of 07-May-2013 09:35, 1st degree AV block is now present I reviewed and concur with this report. Electronically signed [id number], MD, Nathaneil Canary [id number]) on 08/03/2015 11:42:05 PM  Status Results Details    Office Visit on 08/03/2015 [place]")' href="[email or web address]/">Encounter Summary

## 2016-02-13 NOTE — Patient Instructions (Signed)
  Your procedure is scheduled on: 02-21-16 [facility]) Report to Same Day Surgery 2nd floor medical mall To find out your arrival time please call [phone] between 1PM - 3PM on 02-20-16 (TUESDAY)  Remember: Instructions that are not followed completely may result in serious medical risk, up to and including death, or upon the discretion of your surgeon and anesthesiologist your surgery may need to be rescheduled.    _x___ 1. Do not eat food or drink liquids after midnight. No gum chewing or hard candies.     __x__ 2. No Alcohol for 24 hours before or after surgery.   __x__3. No Smoking for 24 prior to surgery.   ____  4. Bring all medications with you on the day of surgery if instructed.    __x__ 5. Notify your doctor if there is any change in your medical condition     (cold, fever, infections).     Do not wear jewelry, make-up, hairpins, clips or nail polish.  Do not wear lotions, powders, or perfumes. You may wear deodorant.  Do not shave 48 hours prior to surgery. Men may shave face and neck.  Do not bring valuables to the hospital.    [facility] is not responsible for any belongings or valuables.               Contacts, dentures or bridgework may not be worn into surgery.  Leave your suitcase in the car. After surgery it may be brought to your room.  For patients admitted to the hospital, discharge time is determined by your treatment team.   Patients discharged the day of surgery will not be allowed to drive home.    Please read over the following fact sheets that you were given:   [facility] Preparing for Surgery and or MRSA Information   ____ Take these medicines the morning of surgery with A SIP OF WATER:    1. NONE  2.  3.  4.  5.  6.  ____Fleets enema or Magnesium Citrate as directed.   _x___ Use CHG Soap or sage wipes as directed on instruction sheet   ____ Use inhalers on the day of surgery and bring to hospital day of surgery  _X___ Stop metformin  2 days prior to surgery-LAST DOSE ON Sunday, October 22nd    ____ Take 1/2 of usual insulin dose the night before surgery and none on the morning of surgery.   _x___ Stop aspirin or coumadin, or plavix-STOP ASPIRIN NOW  x__ Stop Anti-inflammatories such as Advil, Aleve, Ibuprofen, Motrin, Naproxen,          Naprosyn, Goodies powders or aspirin products. Ok to take Tylenol.   ____ Stop supplements until after surgery.    _X___ Bring C-Pap to the hospital.

## 2016-02-14 ENCOUNTER — Encounter
Admission: RE | Admit: 2016-02-14 | Discharge: 2016-02-14 | Disposition: A | Discharge status: Home / Self Care | Payer: BC Managed Care – PPO | Source: Ambulatory Visit | Attending: Unknown Physician Specialty | Admitting: Unknown Physician Specialty

## 2016-02-14 DIAGNOSIS — M25562 Pain in left knee: Secondary | ICD-10-CM | POA: Insufficient documentation

## 2016-02-14 DIAGNOSIS — Z01812 Encounter for preprocedural laboratory examination: Secondary | ICD-10-CM | POA: Diagnosis not present

## 2016-02-14 LAB — BASIC METABOLIC PANEL
Anion gap: 6 (ref 5–15)
BUN: 13 mg/dL (ref 6–20)
CHLORIDE: 104 mmol/L (ref 101–111)
CO2: 27 mmol/L (ref 22–32)
Calcium: 8.9 mg/dL (ref 8.9–10.3)
Creatinine, Ser: 0.56 mg/dL — ABNORMAL LOW (ref 0.61–1.24)
GFR calc Af Amer: >60 mL/min (ref >60)
GFR calc non Af Amer: >60 mL/min (ref >60)
GLUCOSE: 211 mg/dL — AB (ref 65–99)
POTASSIUM: 3.9 mmol/L (ref 3.5–5.1)
Sodium: 137 mmol/L (ref 135–145)

## 2016-02-21 ENCOUNTER — Ambulatory Visit: Payer: BC Managed Care – PPO | Admitting: Certified Registered Nurse Anesthetist

## 2016-02-21 ENCOUNTER — Encounter
Admission: RE | Disposition: A | Discharge status: Home / Self Care | Payer: Self-pay | Source: Ambulatory Visit | Attending: Unknown Physician Specialty

## 2016-02-21 ENCOUNTER — Encounter: Payer: Self-pay | Admitting: *Deleted

## 2016-02-21 ENCOUNTER — Ambulatory Visit
Admission: RE | Admit: 2016-02-21 | Discharge: 2016-02-21 | Disposition: A | Discharge status: Home / Self Care | Payer: BC Managed Care – PPO | Source: Ambulatory Visit | Attending: Unknown Physician Specialty | Admitting: Unknown Physician Specialty

## 2016-02-21 DIAGNOSIS — Z87891 Personal history of nicotine dependence: Secondary | ICD-10-CM | POA: Insufficient documentation

## 2016-02-21 DIAGNOSIS — E119 Type 2 diabetes mellitus without complications: Secondary | ICD-10-CM | POA: Insufficient documentation

## 2016-02-21 DIAGNOSIS — Z7984 Long term (current) use of oral hypoglycemic drugs: Secondary | ICD-10-CM | POA: Diagnosis not present

## 2016-02-21 DIAGNOSIS — M23222 Derangement of posterior horn of medial meniscus due to old tear or injury, left knee: Secondary | ICD-10-CM | POA: Diagnosis present

## 2016-02-21 DIAGNOSIS — Z6841 Body Mass Index (BMI) 40.0 and over, adult: Secondary | ICD-10-CM | POA: Diagnosis not present

## 2016-02-21 DIAGNOSIS — R011 Cardiac murmur, unspecified: Secondary | ICD-10-CM | POA: Insufficient documentation

## 2016-02-21 DIAGNOSIS — M898X6 Other specified disorders of bone, lower leg: Secondary | ICD-10-CM | POA: Insufficient documentation

## 2016-02-21 DIAGNOSIS — Z7982 Long term (current) use of aspirin: Secondary | ICD-10-CM | POA: Insufficient documentation

## 2016-02-21 DIAGNOSIS — G4730 Sleep apnea, unspecified: Secondary | ICD-10-CM | POA: Insufficient documentation

## 2016-02-21 DIAGNOSIS — I10 Essential (primary) hypertension: Secondary | ICD-10-CM | POA: Diagnosis not present

## 2016-02-21 HISTORY — PX: KNEE ARTHROSCOPY: SHX127

## 2016-02-21 LAB — GLUCOSE, CAPILLARY
Glucose-Capillary: 219 mg/dL — ABNORMAL HIGH (ref 65–99)
Glucose-Capillary: 226 mg/dL — ABNORMAL HIGH (ref 65–99)

## 2016-02-21 SURGERY — ARTHROSCOPY, KNEE
Anesthesia: General | Laterality: Left

## 2016-02-21 MED ORDER — FAMOTIDINE 20 MG PO TABS
ORAL_TABLET | ORAL | Status: AC
  Filled 2016-02-21: qty 1

## 2016-02-21 MED ORDER — KETAMINE HCL 50 MG/ML IJ SOLN
INTRAMUSCULAR | Status: DC | PRN
  Administered 2016-02-21: 50 mg via INTRAVENOUS

## 2016-02-21 MED ORDER — FENTANYL CITRATE (PF) 100 MCG/2ML IJ SOLN
INTRAMUSCULAR | Status: DC | PRN
  Administered 2016-02-21: 50 ug via INTRAVENOUS
  Administered 2016-02-21 (×4): 25 ug via INTRAVENOUS

## 2016-02-21 MED ORDER — DEXMEDETOMIDINE HCL IN NACL 200 MCG/50ML IV SOLN
INTRAVENOUS | Status: DC | PRN
  Administered 2016-02-21: 12 ug via INTRAVENOUS

## 2016-02-21 MED ORDER — FENTANYL CITRATE (PF) 100 MCG/2ML IJ SOLN
INTRAMUSCULAR | Status: AC
  Administered 2016-02-21: 50 ug via INTRAVENOUS
  Filled 2016-02-21: qty 2

## 2016-02-21 MED ORDER — BUPIVACAINE HCL (PF) 0.5 % IJ SOLN
INTRAMUSCULAR | Status: DC | PRN
  Administered 2016-02-21: 20 mL

## 2016-02-21 MED ORDER — HYDROCODONE-ACETAMINOPHEN 5-325 MG PO TABS
ORAL_TABLET | ORAL | Status: AC
  Filled 2016-02-21: qty 2

## 2016-02-21 MED ORDER — MIDAZOLAM HCL 2 MG/2ML IJ SOLN
INTRAMUSCULAR | Status: DC | PRN
  Administered 2016-02-21: 2 mg via INTRAVENOUS

## 2016-02-21 MED ORDER — PROPOFOL 10 MG/ML IV BOLUS
INTRAVENOUS | Status: DC | PRN
  Administered 2016-02-21: 200 mg via INTRAVENOUS

## 2016-02-21 MED ORDER — LIDOCAINE HCL (CARDIAC) 20 MG/ML IV SOLN
INTRAVENOUS | Status: DC | PRN
  Administered 2016-02-21: 100 mg via INTRAVENOUS

## 2016-02-21 MED ORDER — ONDANSETRON HCL 4 MG/2ML IJ SOLN
INTRAMUSCULAR | Status: DC | PRN
  Administered 2016-02-21: 4 mg via INTRAVENOUS

## 2016-02-21 MED ORDER — FENTANYL CITRATE (PF) 100 MCG/2ML IJ SOLN
25.0000 ug | INTRAMUSCULAR | Status: DC | PRN
  Administered 2016-02-21 (×2): 50 ug via INTRAVENOUS

## 2016-02-21 MED ORDER — SUCCINYLCHOLINE CHLORIDE 20 MG/ML IJ SOLN
INTRAMUSCULAR | Status: DC | PRN
  Administered 2016-02-21: 120 mg via INTRAVENOUS

## 2016-02-21 MED ORDER — DEXMEDETOMIDINE HCL IN NACL 400 MCG/100ML IV SOLN
INTRAVENOUS | Status: DC | PRN
  Administered 2016-02-21: .3 ug/kg/h via INTRAVENOUS

## 2016-02-21 MED ORDER — NORCO 5-325 MG PO TABS: 1.0000 | ORAL_TABLET | ORAL | 0 refills | Status: DC | PRN

## 2016-02-21 MED ORDER — PROMETHAZINE HCL 25 MG/ML IJ SOLN: 6.2500 mg | INTRAMUSCULAR | Status: DC | PRN

## 2016-02-21 MED ORDER — BUPIVACAINE HCL (PF) 0.5 % IJ SOLN
INTRAMUSCULAR | Status: AC
  Filled 2016-02-21: qty 30

## 2016-02-21 MED ORDER — GLYCOPYRROLATE 0.2 MG/ML IJ SOLN
INTRAMUSCULAR | Status: DC | PRN
  Administered 2016-02-21: 0.3 mg via INTRAVENOUS
  Administered 2016-02-21: 0.1 mg via INTRAVENOUS

## 2016-02-21 MED ORDER — HYDROCODONE-ACETAMINOPHEN 5-325 MG PO TABS
2.0000 | ORAL_TABLET | Freq: Once | ORAL | Status: DC
Start: 2016-02-21 — End: 2016-02-21

## 2016-02-21 MED ORDER — LABETALOL HCL 5 MG/ML IV SOLN
INTRAVENOUS | Status: DC | PRN
  Administered 2016-02-21: 10 mg via INTRAVENOUS
  Administered 2016-02-21: 15 mg via INTRAVENOUS

## 2016-02-21 MED ORDER — SODIUM CHLORIDE 0.9 % IV SOLN
INTRAVENOUS | Status: DC
  Administered 2016-02-21: 07:00:00 via INTRAVENOUS

## 2016-02-21 MED ORDER — FAMOTIDINE 20 MG PO TABS
20.0000 mg | ORAL_TABLET | Freq: Once | ORAL | Status: AC
  Administered 2016-02-21: 20 mg via ORAL

## 2016-02-21 SURGICAL SUPPLY — 41 items
ARTHROWAND PARAGON T2 (SURGICAL WAND)
BLADE ABRADER 4.5 (BLADE) ×3 IMPLANT
BLADE FULL RADIUS 3.5 (BLADE) ×3 IMPLANT
BLADE SHAVER 4.5X7 STR FR (MISCELLANEOUS) ×3 IMPLANT
BNDG ESMARK 6X12 TAN STRL LF (GAUZE/BANDAGES/DRESSINGS) ×3 IMPLANT
BUR ABRADER 4.0 W/FLUTE AQUA (MISCELLANEOUS) ×1 IMPLANT
BURR ABRADER 4.0 W/FLUTE AQUA (MISCELLANEOUS) ×3
CHLORAPREP W/TINT 26ML (MISCELLANEOUS) ×3 IMPLANT
CUFF TOURN 24 STER (MISCELLANEOUS) IMPLANT
CUFF TOURN 34 STER (MISCELLANEOUS) ×3 IMPLANT
DRAPE LEGGINS SURG 28X43 STRL (DRAPES) ×3 IMPLANT
GAUZE SPONGE 4X4 12PLY STRL (GAUZE/BANDAGES/DRESSINGS) ×3 IMPLANT
GLOVE BIO SURGEON STRL SZ7.5 (GLOVE) ×3 IMPLANT
GLOVE BIO SURGEON STRL SZ8 (GLOVE) ×3 IMPLANT
GLOVE INDICATOR 8.0 STRL GRN (GLOVE) ×3 IMPLANT
GOWN STRL REUS W/ TWL LRG LVL3 (GOWN DISPOSABLE) ×1 IMPLANT
GOWN STRL REUS W/TWL LRG LVL3 (GOWN DISPOSABLE) ×2
GOWN STRL REUS W/TWL LRG LVL4 (GOWN DISPOSABLE) ×3 IMPLANT
IV LACTATED RINGER IRRG 3000ML (IV SOLUTION) ×4
IV LR IRRIG 3000ML ARTHROMATIC (IV SOLUTION) ×2 IMPLANT
KIT RM TURNOVER STRD PROC AR (KITS) ×3 IMPLANT
MANIFOLD 4PT FOR NEPTUNE1 (MISCELLANEOUS) ×3 IMPLANT
PACK ARTHROSCOPY KNEE (MISCELLANEOUS) ×3 IMPLANT
PADDING CAST 4IN STRL (MISCELLANEOUS)
PADDING CAST BLEND 4X4 STRL (MISCELLANEOUS) IMPLANT
SET TUBE SUCT SHAVER OUTFL 24K (TUBING) ×3 IMPLANT
SET TUBE TIP INTRA-ARTICULAR (MISCELLANEOUS) ×3 IMPLANT
SOL PREP PVP 2OZ (MISCELLANEOUS) ×3
SOLUTION PREP PVP 2OZ (MISCELLANEOUS) ×1 IMPLANT
SUT ETH BLK MONO 3 0 FS 1 12/B (SUTURE) ×3 IMPLANT
SUT ETHILON 3-0 FS-10 30 BLK (SUTURE) ×3
SUTURE EHLN 3-0 FS-10 30 BLK (SUTURE) ×1 IMPLANT
TAPE MICROFOAM 4IN (TAPE) ×3 IMPLANT
TUBING ARTHRO INFLOW-ONLY STRL (TUBING) ×3 IMPLANT
WAND 30 DEG SABER W/CORD (SURGICAL WAND) ×3 IMPLANT
WAND ARTHRO PARAGON T2 (SURGICAL WAND) IMPLANT
WAND COBLATION FLOW 50 (SURGICAL WAND) ×3 IMPLANT
WAND COVAC 50 IFS (MISCELLANEOUS) IMPLANT
WAND HAND CNTRL MULTIVAC 50 (MISCELLANEOUS) ×3 IMPLANT
WAND HAND CNTRL MULTIVAC 90 (MISCELLANEOUS) IMPLANT
WRAP KNEE W/COLD PACKS 25.5X14 (SOFTGOODS) ×3 IMPLANT

## 2016-02-21 NOTE — Anesthesia Procedure Notes (Signed)
Procedure Name: LMA Insertion Performed by: Rosaria Ferries, Hank Pre-anesthesia Checklist: Patient identified, Emergency Drugs available, Suction available and Patient being monitored Patient Re-evaluated:Patient Re-evaluated prior to inductionOxygen Delivery Method: Circle system utilized Preoxygenation: Pre-oxygenation with 100% oxygen Intubation Type: IV induction Ventilation: Two handed mask ventilation required LMA Size: 4.5 Laryngoscope Size: Mac and 4 Grade View: Grade III Tube type: failed intubation. Tube size: 7.0 mm Number of attempts: 3 Placement Confirmation: positive ETCO2 and breath sounds checked- equal and bilateral

## 2016-02-21 NOTE — Anesthesia Preprocedure Evaluation (Signed)
Anesthesia Evaluation  Patient identified by MRN, date of birth, ID band Patient awake    Reviewed: Allergy & Precautions, H&P , NPO status , Patient's Chart, lab work & pertinent test results, reviewed documented beta blocker date and time   History of Anesthesia Complications Negative for: history of anesthetic complications  Airway Mallampati: II  TM Distance: >3 FB Neck ROM: full    Dental  (+) Chipped, Caps, Teeth Intact Bottom left central incisor is chipped from a previous fall.:   Pulmonary neg shortness of breath, sleep apnea and Continuous Positive Airway Pressure Ventilation , neg COPD, neg recent URI, former smoker,    Pulmonary exam normal breath sounds clear to auscultation       Cardiovascular Exercise Tolerance: Good hypertension, (-) angina(-) CAD, (-) Past MI, (-) Cardiac Stents and (-) CABG Normal cardiovascular exam(-) dysrhythmias + Valvular Problems/Murmurs  Rhythm:regular Rate:Normal     Neuro/Psych neg Seizures PSYCHIATRIC DISORDERS (Depression) Non-secreting pituitary adenoma, stable size since 1999.  Patient does note some decreased peripheral vision that has been stable, but no other symptoms.    GI/Hepatic negative GI ROS, Neg liver ROS,   Endo/Other  diabetes, Oral Hypoglycemic AgentsMorbid obesity  Renal/GU Renal disease (kidney stones)  negative genitourinary   Musculoskeletal   Abdominal   Peds  Hematology negative hematology ROS (+)   Anesthesia Other Findings Past Medical History:   Gout                                                         Diabetes ([place])                                               Hypertension                                                 Depression                                                   Sleep apnea                                                    Comment:uses cpap   Anxiety                                                      Heart murmur  Obesity                                                      Pituitary adenoma ([place])                                      Reproductive/Obstetrics negative OB ROS                             Anesthesia Physical  Anesthesia Plan  ASA: III  Anesthesia Plan: General   Post-op Pain Management:    Induction:   Airway Management Planned:   Additional Equipment:   Intra-op Plan:   Post-operative Plan:   Informed Consent: I have reviewed the patients History and Physical, chart, labs and discussed the procedure including the risks, benefits and alternatives for the proposed anesthesia with the patient or authorized representative who has indicated his/her understanding and acceptance.   Dental Advisory Given  Plan Discussed with: Anesthesiologist, CRNA and Surgeon  Anesthesia Plan Comments:         Anesthesia Quick Evaluation

## 2016-02-21 NOTE — Anesthesia Postprocedure Evaluation (Signed)
Anesthesia Post Note  Patient: Jacob [name]  Procedure(s) Performed: Procedure(s) (LRB): ARTHROSCOPY KNEE (Left)  Patient location during evaluation: PACU Anesthesia Type: General Level of consciousness: awake and alert Pain management: pain level controlled Vital Signs Assessment: post-procedure vital signs reviewed and stable Respiratory status: spontaneous breathing, nonlabored ventilation and respiratory function stable Cardiovascular status: blood pressure returned to baseline and stable Postop Assessment: no signs of nausea or vomiting Anesthetic complications: no    Last Vitals:  Vitals:   02/21/16 1008 02/21/16 1022  BP: (!) 141/78 121/61  Pulse: 72 62  Resp: 13 14  Temp: 36.5 C 36.5 C    Last Pain:  Vitals:   02/21/16 1022  TempSrc: Oral  PainSc: 6                  Martha Clan

## 2016-02-21 NOTE — Transfer of Care (Signed)
Immediate Anesthesia Transfer of Care Note  Patient: Jacob [name]  Procedure(s) Performed: Procedure(s): ARTHROSCOPY KNEE (Left)  Patient Location: PACU  Anesthesia Type:General  Level of Consciousness: awake and patient cooperative  Airway & Oxygen Therapy: Patient Spontanous Breathing and Patient connected to face mask oxygen  Post-op Assessment: Report given to RN and Post -op Vital signs reviewed and stable  Post vital signs: Reviewed and stable  Last Vitals:  Vitals:   02/21/16 0620 02/21/16 0923  BP: (!) 183/102 (!) 149/81  Pulse: 84   Resp: 18   Temp: 36.8 C 36.1 C    Last Pain:  Vitals:   02/21/16 0620  TempSrc: Oral  PainSc: 4          Complications: No apparent anesthesia complications

## 2016-02-21 NOTE — Op Note (Signed)
Patient: Jacob [name], Jacob [name].  Preoperative diagnosis: Torn medial meniscus left knee with early medial compartment chondral changes  Postop diagnosis: Same  Operation: Arthroscopic partial medial meniscectomy left knee plus chondral debridement and Coblation  Surgeon: Vilinda Flake, MD  Anesthesia: Gen.   History: Patient's had a long history of left knee pain.  The plain films revealed very minimal narrowing medial compartment left knee. It was felt that the patient had a torn medial meniscus with early medial compartment chondral pathology. He had a previous right knee arthroscopy with findings of a torn medial meniscus plus early medial compartment pathology. He had an arthroscopic partial medial meniscectomy on the right plus chondral debridement. The patient done well from that procedure. The patient was scheduled for surgery on his left knee due to persistent discomfort despite conservative treatment.  The patient was taken the operating room where satisfactory general anesthesia was achieved. A tourniquet and leg holder were was applied to the left thigh. A well leg support was applied to the nonoperative extremity. The left knee was prepped and draped in usual fashion for an arthroscopic procedure. An inflow cannula was introduced superomedially. The joint was distended with lactated Ringer's. Scope was introduced through an inferolateral puncture wound and a probe through an inferomedial puncture wound. Inspection of the medial compartment revealed a radial tear of the posterior horn of the medial meniscus. The patient also had  grade 3 chondral changes in the mid weightbearing portion of the medial femoral condyle. I went ahead and performed an arthroscopic partial medial meniscectomy using a combination of basket biters and a motorized resector. The remaining rim was contoured with an angled ArthroCare wand. The grade 3 chondral lesions in the mid weightbearing portion of the medial  femoral condyle  were debrided with a turbo whisker and then coblated with an angled ArthroCare wand. Inspection of the intercondylar notch revealed intact cruciates. Inspection of the the lateral compartment revealed very mild mid lateral femoral chondral fibrillation.   Trochlear groove inspection revealed grade 2-3 chondral patholog  Inspection of the retropatellar surface revealed grade 2 chondral fraying.   The patella seemed to track fairly well.  The instruments were removed from the joint at this time. The puncture wounds were closed with 3-0 nylon in vertical mattress fashion. I injected each puncture wound with several cc of half percent Marcaine without epinephrine. Betadine was applied the wounds followed by sterile dressing. An ice pack was applied to the right knee. The patient was awakened and transferred to the stretcher bed. The patient was taken to the recovery room in satisfactory condition.  The tourniquet was inflated during the latter course of the procedure. It was up for about 32 minutes. It was deflated at the conclusion of the procedure. Blood loss was negligible.

## 2016-02-21 NOTE — Discharge Instructions (Signed)
AMBULATORY SURGERY  DISCHARGE INSTRUCTIONS   1) The drugs that you were given will stay in your system until tomorrow so for the next 24 hours you should not:  A) Drive an automobile B) Make any legal decisions C) Drink any alcoholic beverage   2) You may resume regular meals tomorrow.  Today it is better to start with liquids and gradually work up to solid foods.  You may eat anything you prefer, but it is better to start with liquids, then soup and crackers, and gradually work up to solid foods.   3) Please notify your doctor immediately if you have any unusual bleeding, trouble breathing, redness and pain at the surgery site, drainage, fever, or pain not relieved by medication.    4) Additional Instructions:        Please contact your physician with any problems or Same Day Surgery at [phone], Monday through Friday 6 am to 4 pm, or [place] at [facility] number at [phone].  [facility]  Kathrene Alu., M.D. [phone]   KNEE ARTHROSCOPY POST OPERATION INSTRUCTIONS:  PLEASE READ THESE INSTRUCTIONS ABOUT POST OPERATION CARE. THEY WILL ANSWER MOST OF YOUR QUESTIONS.  You have been given a prescription for pain. Please take as directed for pain.  You can walk, keeping the knee slightly stiff-avoid doing too much bending the first day. (if ACL reconstruction is performed, keep brace locked in extension when walking.)  You will use crutches or cane if needed. Can weight bear as tolerated  Plan to take three to four days off from work. You can resume work when you are comfortable. (This can be a week or more, depending on the type of work you do.)  To reduce pain and swelling, place one to two pillows under the knee the first two or three days when sitting or lying. An ice pack may be placed on top of the area over the dressing. Instructions for making homemade icepack are as follow:  Flexible homemade alcohol  water ice pack  2 cups water  1 cup rubbing alcohol  food coloring for the blue tint (optional)  2 zip-top bags - gallon-size  Mix the water and alcohol together in one of your zip-top bags and add food coloring. Release as much air as possible and seal the bag. Place in freezer for at least 12 hours.  The small incisions in your knee are closed with nylon stitches. They will be removed in the office.  The bulky dressing may be removed in the third day after surgery. (If ACL surgery-DO NOT REMOVE BANDAGES). Put a waterproof band-aid over each stitch. Do not put any creams or ointments on wounds. You may shower at this time, but change waterproof band-aids after showering. KEEP INCISIONS CLEAN AND DRY UNTIL YOU RETURN TO THE OFFICE.  Sometimes the operative area remains somewhat painful and swollen for several weeks. This is usually nothing to worry about, but call if you have any excessive symptoms, especially fever. It is not unusual to have a low grade fever of 99 degrees for the first few days. If persist after 3-4 days call the office. It is not uncommon for the pain to be a little worse on the third day after surgery.  Begin doing gentle exercises right away. They will be limited by the amount of pain and swelling you have.  Exercising will reduce the swelling, increase motion, and prevent muscle weakness. Exercises: Straight leg raising and gentle knee  bending.  Take 81 milligram aspirin twice a day for 2 weeks after meals or milk. This along with elevation will help reduce the possibility of phlebitis in your operated leg.  Avoid strenuous athletics for a minimum of 4 to 6 weeks after arthroscopic surgery (approximately five months if ACL surgery).  If the surgery included ACL reconstruction the brace that is supplied to the extremity post surgery is to be locked in extension when you are asleep and is to be locked in extension when you are ambulating. It can be unlocked for exercises or sitting.   Keep your post surgery appointment that has been made for you. If you do not remember the date call [phone]. Your follow up appointment should be between 7-10 days.

## 2016-02-21 NOTE — H&P (Signed)
  H and P reviewed. No changes. Uploaded at later date. 

## 2016-02-23 ENCOUNTER — Encounter: Payer: Self-pay | Admitting: Unknown Physician Specialty

## 2016-04-12 DIAGNOSIS — S83232A Complex tear of medial meniscus, current injury, left knee, initial encounter: Secondary | ICD-10-CM | POA: Insufficient documentation

## 2016-04-13 ENCOUNTER — Encounter: Payer: Self-pay | Admitting: Emergency Medicine

## 2016-04-13 ENCOUNTER — Ambulatory Visit
Admission: EM | Admit: 2016-04-13 | Discharge: 2016-04-13 | Disposition: A | Discharge status: Home / Self Care | Payer: BC Managed Care – PPO | Attending: Emergency Medicine | Admitting: Emergency Medicine

## 2016-04-13 DIAGNOSIS — J0110 Acute frontal sinusitis, unspecified: Secondary | ICD-10-CM

## 2016-04-13 MED ORDER — AZITHROMYCIN 250 MG PO TABS: 250.0000 mg | ORAL_TABLET | Freq: Every day | ORAL | 0 refills | Status: DC

## 2016-04-13 MED ORDER — PREDNISONE 10 MG (21) PO TBPK: 10.0000 mg | ORAL_TABLET | Freq: Every day | ORAL | 0 refills | Status: DC

## 2016-04-13 NOTE — ED Triage Notes (Signed)
Patient c/o sinus pain and congestion, HAs, and cough since Wed night.  Patient denies fevers.

## 2016-04-13 NOTE — Discharge Instructions (Signed)
Check sugar daily due to steroid can causes an elevation in levels.  Take full dose abx May take tylenol or motrin as needed for pain

## 2016-04-13 NOTE — ED Provider Notes (Signed)
CSN: [id number]     Arrival date & time 04/13/16  0803 History   First MD Initiated Contact with Patient 04/13/16 [phone]     Chief Complaint  Patient presents with  . Cough  . Nasal Congestion  . Facial Pain   (Consider location/radiation/quality/duration/timing/severity/associated sxs/prior Treatment) Pt states that he has had frontal nasal pressure and cough for 1 week gradually getting worse. Causing intermit headaches none today. Pt has tried several otc that has not helped. Denies any n/v/d intermit low grade fevers.       Past Medical History:  Diagnosis Date  . Anxiety   . Depression   . Diabetes ([place])   . Gout   . Heart murmur   . Hypertension   . Obesity   . Pituitary adenoma ([place])   . Sleep apnea    uses cpap   Past Surgical History:  Procedure Laterality Date  . CHOLECYSTECTOMY    . KNEE ARTHROSCOPY Right 08/23/2015   Procedure: ARTHROSCOPY KNEE, partial medial and lateral menisectomy, medial and lateral chondolar microfracture.;  Surgeon: Leanor Kail, MD;  Location: [facility];  Service: Orthopedics;  Laterality: Right;  . KNEE ARTHROSCOPY Left 02/21/2016   Procedure: ARTHROSCOPY KNEE;  Surgeon: Leanor Kail, MD;  Location: [facility];  Service: Orthopedics;  Laterality: Left;  . TONSILLECTOMY    . TONSILLECTOMY AND ADENOIDECTOMY    . UVULOPALATOPHARYNGOPLASTY    . WISDOM TOOTH EXTRACTION     Family History  Problem Relation Age of Onset  . Lung cancer Father    Social History  Substance Use Topics  . Smoking status: Former Smoker    Packs/day: 1.00    Years: 10.00    Types: Cigarettes    Quit date: 04/29/1993  . Smokeless tobacco: Former Systems developer    Types: Chew    Quit date: 05/12/2015  . Alcohol use 0.0 oz/week     Comment: rarely    Review of Systems  Allergies  Hydrocortisone  Home Medications   Prior to Admission medications   Medication Sig Start Date End Date Taking? Authorizing Provider  allopurinol (ZYLOPRIM) 100 MG tablet Take 100 mg  by mouth at bedtime.     Historical Provider, MD  aspirin EC 81 MG tablet Take 81 mg by mouth daily.    Historical Provider, MD  azithromycin (ZITHROMAX) 250 MG tablet Take 1 tablet (250 mg total) by mouth daily. Take first 2 tablets together, then 1 every day until finished. 04/13/16   Melanee Left, NP  colchicine 0.6 MG tablet Take 0.6 mg by mouth at bedtime.     Historical Provider, MD  fluocinonide (LIDEX) 0.05 % external solution Apply 1 application topically as needed.    Historical Provider, MD  FLUoxetine (PROZAC) 20 MG capsule Take 20 mg by mouth at bedtime.     Historical Provider, MD  fluticasone (FLONASE) 50 MCG/ACT nasal spray Place into both nostrils daily.    Historical Provider, MD  indomethacin (INDOCIN) 25 MG capsule Take 1 capsule (25 mg total) by mouth 3 (three) times daily as needed. 05/24/15   Paulina Fusi, MD  ketoconazole (NIZORAL) 2 % cream Apply 1 application topically 2 (two) times daily as needed for irritation.    Historical Provider, MD  lisinopril (PRINIVIL,ZESTRIL) 5 MG tablet Take 5 mg by mouth at bedtime.     Historical Provider, MD  metFORMIN (GLUMETZA) 500 MG (MOD) 24 hr tablet Take 1,000 mg by mouth daily with breakfast.     Historical Provider, MD  predniSONE (  STERAPRED UNI-PAK 21 TAB) 10 MG (21) TBPK tablet Take 1 tablet (10 mg total) by mouth daily. Take 6 tabs by mouth daily  for 2 days, then 5 tabs for 2 days, then 4 tabs for 2 days, then 3 tabs for 2 days, 2 tabs for 2 days, then 1 tab by mouth daily for 2 days 04/13/16   Melanee Left, NP   Meds Ordered and Administered this Visit  Medications - No data to display  BP (!) 155/77 (BP Location: Left Arm)   Pulse 75   Temp 98.1 F (36.7 C) (Oral)   Resp 18   Ht 6' (1.829 m)   Wt (!) 362 lb (164.2 kg)   SpO2 97%   BMI 49.10 kg/m  No data found.   Physical Exam  Urgent Care Course   Clinical Course     Procedures (including critical care time)  Labs Review Labs Reviewed - No  data to display  Imaging Review No results found.        MDM   1. Acute non-recurrent frontal sinusitis    Check sugar daily due to steroid can causes an elevation in levels.  Take full dose abx May take tylenol or motrin as needed for pain  Follow up with pcp and your ent     Melanee Left, NP 04/13/16 1358

## 2016-05-14 DIAGNOSIS — R7989 Other specified abnormal findings of blood chemistry: Secondary | ICD-10-CM | POA: Insufficient documentation

## 2016-05-14 DIAGNOSIS — E221 Hyperprolactinemia: Secondary | ICD-10-CM | POA: Insufficient documentation

## 2016-05-29 DIAGNOSIS — M94262 Chondromalacia, left knee: Secondary | ICD-10-CM | POA: Insufficient documentation

## 2016-08-06 ENCOUNTER — Ambulatory Visit
Admission: EM | Admit: 2016-08-06 | Discharge: 2016-08-06 | Disposition: A | Discharge status: Home / Self Care | Payer: BC Managed Care – PPO | Attending: Emergency Medicine | Admitting: Emergency Medicine

## 2016-08-06 DIAGNOSIS — L255 Unspecified contact dermatitis due to plants, except food: Secondary | ICD-10-CM | POA: Diagnosis not present

## 2016-08-06 MED ORDER — PREDNISONE 10 MG (21) PO TBPK: ORAL_TABLET | Freq: Every day | ORAL | 0 refills | Status: DC

## 2016-08-06 MED ORDER — MUPIROCIN 2 % EX OINT: 1.0000 "application " | TOPICAL_OINTMENT | Freq: Three times a day (TID) | CUTANEOUS | 0 refills | Status: DC

## 2016-08-06 NOTE — ED Triage Notes (Signed)
Patient complains of poison ivy or oak that started 10 days ago. Patient states that areas are itchy. Patient states that he noticed areas spreading.

## 2016-08-06 NOTE — ED Provider Notes (Signed)
CSN: [id number]     Arrival date & time 08/06/16  4656 History   None    Chief Complaint  Patient presents with  . Poison Ivy   (Consider location/radiation/quality/duration/timing/severity/associated sxs/prior Treatment) HPI  This a 46 year old male who presents with exposure to poison ivy or O this started about 10 days ago. Now he has areas are itchy mostly on his right upper extremity in his lower extremities. He has one area over the lateral left ankle that become slightly infected. He states that he is very susceptible to poison ivy and has in the past had numerous episodes with it.      Past Medical History:  Diagnosis Date  . Anxiety   . Depression   . Diabetes ([place])   . Gout   . Heart murmur   . Hypertension   . Obesity   . Pituitary adenoma ([place])   . Sleep apnea    uses cpap   Past Surgical History:  Procedure Laterality Date  . CHOLECYSTECTOMY    . KNEE ARTHROSCOPY Right 08/23/2015   Procedure: ARTHROSCOPY KNEE, partial medial and lateral menisectomy, medial and lateral chondolar microfracture.;  Surgeon: Leanor Kail, MD;  Location: [facility];  Service: Orthopedics;  Laterality: Right;  . KNEE ARTHROSCOPY Left 02/21/2016   Procedure: ARTHROSCOPY KNEE;  Surgeon: Leanor Kail, MD;  Location: [facility];  Service: Orthopedics;  Laterality: Left;  . TONSILLECTOMY    . TONSILLECTOMY AND ADENOIDECTOMY    . UVULOPALATOPHARYNGOPLASTY    . WISDOM TOOTH EXTRACTION     Family History  Problem Relation Age of Onset  . Lung cancer Father    Social History  Substance Use Topics  . Smoking status: Former Smoker    Packs/day: 1.00    Years: 10.00    Types: Cigarettes    Quit date: 04/29/1993  . Smokeless tobacco: Former Systems developer    Types: Chew    Quit date: 05/12/2015  . Alcohol use 0.0 oz/week     Comment: rarely    Review of Systems  Constitutional: Positive for activity change. Negative for chills, fatigue and fever.  Skin: Positive for rash.  All other systems  reviewed and are negative.   Allergies  Hydrocortisone  Home Medications   Prior to Admission medications   Medication Sig Start Date End Date Taking? Authorizing Provider  allopurinol (ZYLOPRIM) 100 MG tablet Take 100 mg by mouth at bedtime.    Yes Historical Provider, MD  aspirin EC 81 MG tablet Take 81 mg by mouth daily.   Yes Historical Provider, MD  colchicine 0.6 MG tablet Take 0.6 mg by mouth at bedtime.    Yes Historical Provider, MD  fluocinonide (LIDEX) 0.05 % external solution Apply 1 application topically as needed.   Yes Historical Provider, MD  FLUoxetine (PROZAC) 20 MG capsule Take 20 mg by mouth at bedtime.    Yes Historical Provider, MD  fluticasone (FLONASE) 50 MCG/ACT nasal spray Place into both nostrils daily.   Yes Historical Provider, MD  indomethacin (INDOCIN) 25 MG capsule Take 1 capsule (25 mg total) by mouth 3 (three) times daily as needed. 05/24/15  Yes Paulina Fusi, MD  ketoconazole (NIZORAL) 2 % cream Apply 1 application topically 2 (two) times daily as needed for irritation.   Yes Historical Provider, MD  lisinopril (PRINIVIL,ZESTRIL) 5 MG tablet Take 5 mg by mouth at bedtime.    Yes Historical Provider, MD  metFORMIN (GLUMETZA) 500 MG (MOD) 24 hr tablet Take 1,000 mg by mouth daily with breakfast.  Yes Historical Provider, MD  mupirocin ointment (BACTROBAN) 2 % Apply 1 application topically 3 (three) times daily. 08/06/16   Lorin Picket, PA-C  predniSONE (STERAPRED UNI-PAK 21 TAB) 10 MG (21) TBPK tablet Take by mouth daily. Take 6 tabs by mouth daily  for 2 days, then 5 tabs for 2 days, then 4 tabs for 2 days, then 3 tabs for 2 days, 2 tabs for 2 days, then 1 tab by mouth daily for 2 days 08/06/16   Lorin Picket, PA-C   Meds Ordered and Administered this Visit  Medications - No data to display  BP (!) 143/76   Pulse 65   Temp 97.8 F (36.6 C) (Oral)   Resp 16   Ht 6' (1.829 m)   Wt (!) 365 lb (165.6 kg)   SpO2 96%   BMI 49.50 kg/m  No data  found.   Physical Exam  Constitutional: He appears well-developed and well-nourished. No distress.  HENT:  Head: Normocephalic and atraumatic.  Eyes: Pupils are equal, round, and reactive to light.  Neck: Normal range of motion.  Musculoskeletal: Normal range of motion.  Neurological: He is alert.  Skin: Rash noted. He is not diaphoretic.  Refer to pictures for description. The lesion on his left ankle is from an area where he had normal pigmented skin for removal of a mole although it is much more angry looking. It does blanch to pressure. Is no induration and no discharge present.  Psychiatric: He has a normal mood and affect. His behavior is normal. Judgment and thought content normal.  Nursing note and vitals reviewed.       Urgent Care Course     Procedures (including critical care time)  Labs Review Labs Reviewed - No data to display  Imaging Review No results found.   Visual Acuity Review  Right Eye Distance:   Left Eye Distance:   Bilateral Distance:    Right Eye Near:   Left Eye Near:    Bilateral Near:         MDM   1. Dermatitis due to plants, including poison ivy, sumac, and oak    Discharge Medication List as of 08/06/2016  9:30 AM    START taking these medications   Details  mupirocin ointment (BACTROBAN) 2 % Apply 1 application topically 3 (three) times daily., Starting Tue 08/06/2016, Normal      Plan: 1. Test/x-ray results and diagnosis reviewed with patient 2. rx as per orders; risks, benefits, potential side effects reviewed with patient 3. Recommend supportive treatment with Using Bactroban on the wound on his ankle 3 times daily after cleansing I will soap and water. Follow-up with primary care is not improving. 4. F/u prn if symptoms worsen or don't improve     Lorin Picket, PA-C 08/06/16 [phone]

## 2016-08-19 ENCOUNTER — Encounter: Payer: Self-pay | Admitting: Oncology

## 2016-08-19 ENCOUNTER — Inpatient Hospital Stay: Payer: BC Managed Care – PPO | Attending: Oncology | Admitting: Oncology

## 2016-08-19 ENCOUNTER — Inpatient Hospital Stay: Payer: BC Managed Care – PPO

## 2016-08-19 VITALS — BP 147/90 | HR 71 | Temp 98.0°F | Resp 20 | Ht 72.05 in | Wt 351.9 lb

## 2016-08-19 DIAGNOSIS — Z79899 Other long term (current) drug therapy: Secondary | ICD-10-CM | POA: Diagnosis not present

## 2016-08-19 DIAGNOSIS — D649 Anemia, unspecified: Secondary | ICD-10-CM | POA: Diagnosis not present

## 2016-08-19 DIAGNOSIS — E119 Type 2 diabetes mellitus without complications: Secondary | ICD-10-CM | POA: Diagnosis not present

## 2016-08-19 DIAGNOSIS — I10 Essential (primary) hypertension: Secondary | ICD-10-CM | POA: Insufficient documentation

## 2016-08-19 DIAGNOSIS — Z7982 Long term (current) use of aspirin: Secondary | ICD-10-CM | POA: Diagnosis not present

## 2016-08-19 DIAGNOSIS — E8881 Metabolic syndrome: Secondary | ICD-10-CM | POA: Insufficient documentation

## 2016-08-19 DIAGNOSIS — M25562 Pain in left knee: Secondary | ICD-10-CM | POA: Insufficient documentation

## 2016-08-19 DIAGNOSIS — R7989 Other specified abnormal findings of blood chemistry: Secondary | ICD-10-CM | POA: Insufficient documentation

## 2016-08-19 DIAGNOSIS — Z87891 Personal history of nicotine dependence: Secondary | ICD-10-CM

## 2016-08-19 DIAGNOSIS — M25561 Pain in right knee: Secondary | ICD-10-CM

## 2016-08-19 DIAGNOSIS — Z7984 Long term (current) use of oral hypoglycemic drugs: Secondary | ICD-10-CM | POA: Insufficient documentation

## 2016-08-19 DIAGNOSIS — G8929 Other chronic pain: Secondary | ICD-10-CM

## 2016-08-19 DIAGNOSIS — E221 Hyperprolactinemia: Secondary | ICD-10-CM | POA: Diagnosis not present

## 2016-08-19 LAB — CBC WITH DIFFERENTIAL/PLATELET
BASOS ABS: 0.1 10*3/uL (ref 0–0.1)
BASOS PCT: 1 %
EOS ABS: 0.3 10*3/uL (ref 0–0.7)
Eosinophils Relative: 2 %
HEMATOCRIT: 42.1 % (ref 40.0–52.0)
HEMOGLOBIN: 14.5 g/dL (ref 13.0–18.0)
Lymphocytes Relative: 16 %
Lymphs Abs: 1.9 10*3/uL (ref 1.0–3.6)
MCH: 30.5 pg (ref 26.0–34.0)
MCHC: 34.4 g/dL (ref 32.0–36.0)
MCV: 88.6 fL (ref 80.0–100.0)
MONO ABS: 0.6 10*3/uL (ref 0.2–1.0)
Monocytes Relative: 5 %
NEUTROS PCT: 76 %
Neutro Abs: 8.9 10*3/uL — ABNORMAL HIGH (ref 1.4–6.5)
Platelets: 219 10*3/uL (ref 150–440)
RBC: 4.75 MIL/uL (ref 4.40–5.90)
RDW: 13.5 % (ref 11.5–14.5)
WBC: 11.7 10*3/uL — ABNORMAL HIGH (ref 3.8–10.6)

## 2016-08-19 LAB — COMPREHENSIVE METABOLIC PANEL
ALBUMIN: 3.7 g/dL (ref 3.5–5.0)
ALT: 28 U/L (ref 17–63)
AST: 18 U/L (ref 15–41)
Alkaline Phosphatase: 88 U/L (ref 38–126)
Anion gap: 7 (ref 5–15)
BILIRUBIN TOTAL: 0.8 mg/dL (ref 0.3–1.2)
BUN: 12 mg/dL (ref 6–20)
CALCIUM: 8.5 mg/dL — AB (ref 8.9–10.3)
CO2: 26 mmol/L (ref 22–32)
Chloride: 103 mmol/L (ref 101–111)
Creatinine, Ser: 0.73 mg/dL (ref 0.61–1.24)
GFR calc Af Amer: >60 mL/min (ref >60)
GLUCOSE: 258 mg/dL — AB (ref 65–99)
POTASSIUM: 3.5 mmol/L (ref 3.5–5.1)
SODIUM: 136 mmol/L (ref 135–145)
TOTAL PROTEIN: 7.4 g/dL (ref 6.5–8.1)

## 2016-08-19 NOTE — Progress Notes (Signed)
Hematology/Oncology Consult note [facility] Telephone:([phone] [phone]  Patient Care Team: Gayland Curry, MD as PCP - General (Family Medicine)   Name of the patient: Jacob [name]  [id number]  [birth year]    Reason for referral- elevated ferritin/ anemia   Referring physician- Dr. Astrid Divine  Date of visit: 08/19/16   History of presenting illness- Patient is a 46 yr old male with pmh significant for HTN, type 2 DM, obesity and hyperprolactinemia. He has been referred to [place] for elevated ferritin and anemia. Patient has chronically elevated ferritin ranging between 200-335 over last 2 years. Recent ferritin was 335. Transferrin saturation has always been between 22-30%. No family h/o blood disorders/ hemochromatosis. Recent cbc from 06/13/16 showed wbc of 10.2, H/H of 13.6/38.5 and platelet count of 225. CMP was WNL except for elevated blood sugar of 163. Overall he is doing well. Reports chronic b/l knee pain. He has been trying to lose weight. No h/o cardiac issues. Denies other complaints. Patient drinks occasional alcohol about 4-5 times a month  ECOG PS- 0  Pain scale- 2   Review of systems- Review of Systems  Constitutional: Negative for chills, fever, malaise/fatigue and weight loss.  HENT: Negative for congestion, ear discharge and nosebleeds.   Eyes: Negative for blurred vision.  Respiratory: Negative for cough, hemoptysis, sputum production, shortness of breath and wheezing.   Cardiovascular: Negative for chest pain, palpitations, orthopnea and claudication.  Gastrointestinal: Negative for abdominal pain, blood in stool, constipation, diarrhea, heartburn, melena, nausea and vomiting.  Genitourinary: Negative for dysuria, flank pain, frequency, hematuria and urgency.  Musculoskeletal: Positive for joint pain. Negative for back pain and myalgias.  Skin: Negative for rash.  Neurological: Negative for dizziness, tingling, focal  weakness, seizures, weakness and headaches.  Endo/Heme/Allergies: Does not bruise/bleed easily.  Psychiatric/Behavioral: Negative for depression and suicidal ideas. The patient does not have insomnia.     Allergies  Allergen Reactions  . Hydrocortisone Rash and Dermatitis    Topical Creams Topical Creams Topical Creams Topical Creams Topical Creams    Patient Active Problem List   Diagnosis Date Noted  . Non-pressure chronic ulcer of left ankle, limited to breakdown of skin ([place]) 04/25/2015  . HYPERTENSION 02/22/2008  . SLEEP APNEA 02/22/2008  . PITUITARY NEOPLASM, HX OF 02/22/2008     Past Medical History:  Diagnosis Date  . Adenoma of pituitary ([place]) 1998   was to have removed -surgeon changed his mind per pt  . Anxiety   . Depression   . Diabetes ([place])   . Gout   . Heart murmur   . Hypertension   . Obesity   . Sleep apnea    uses cpap     Past Surgical History:  Procedure Laterality Date  . CHOLECYSTECTOMY    . KNEE ARTHROSCOPY Right 08/23/2015   Procedure: ARTHROSCOPY KNEE, partial medial and lateral menisectomy, medial and lateral chondolar microfracture.;  Surgeon: Leanor Kail, MD;  Location: [facility];  Service: Orthopedics;  Laterality: Right;  . KNEE ARTHROSCOPY Left 02/21/2016   Procedure: ARTHROSCOPY KNEE;  Surgeon: Leanor Kail, MD;  Location: [facility];  Service: Orthopedics;  Laterality: Left;  . TONSILLECTOMY    . TONSILLECTOMY AND ADENOIDECTOMY    . UVULOPALATOPHARYNGOPLASTY    . WISDOM TOOTH EXTRACTION      Social History   Social History  . Marital status: Married    Spouse name: N/A  . Number of children: N/A  . Years of education: N/A   Occupational History  .  Not on file.   Social History Main Topics  . Smoking status: Former Smoker    Packs/day: 1.00    Years: 10.00    Types: Cigarettes    Quit date: 04/29/1993  . Smokeless tobacco: Former Systems developer    Types: Chew    Quit date: 05/12/2015  . Alcohol use 0.0 oz/week      Comment: rarely  . Drug use: No  . Sexual activity: Yes   Other Topics Concern  . Not on file   Social History Narrative  . No narrative on file     Family History  Problem Relation Age of Onset  . Lung cancer Father   . Thyroid disease Mother   . Thyroid disease Sister      Current Outpatient Prescriptions:  .  allopurinol (ZYLOPRIM) 100 MG tablet, Take 100 mg by mouth at bedtime. , Disp: , Rfl:  .  aspirin EC 81 MG tablet, Take 81 mg by mouth daily., Disp: , Rfl:  .  atorvastatin (LIPITOR) 20 MG tablet, , Disp: , Rfl:  .  FLUoxetine (PROZAC) 20 MG capsule, Take 20 mg by mouth at bedtime. , Disp: , Rfl:  .  fluticasone (FLONASE) 50 MCG/ACT nasal spray, Place into both nostrils daily., Disp: , Rfl:  .  glucose blood (ONE TOUCH ULTRA TEST) test strip, , Disp: , Rfl:  .  lisinopril (PRINIVIL,ZESTRIL) 5 MG tablet, Take 5 mg by mouth at bedtime. , Disp: , Rfl:  .  metFORMIN (GLUMETZA) 500 MG (MOD) 24 hr tablet, Take 1,000 mg by mouth daily with breakfast. , Disp: , Rfl:  .  ONETOUCH DELICA LANCETS FINE MISC, , Disp: , Rfl:  .  predniSONE (STERAPRED UNI-PAK 21 TAB) 10 MG (21) TBPK tablet, Take by mouth daily. Take 6 tabs by mouth daily  for 2 days, then 5 tabs for 2 days, then 4 tabs for 2 days, then 3 tabs for 2 days, 2 tabs for 2 days, then 1 tab by mouth daily for 2 days, Disp: 42 tablet, Rfl: 0 .  colchicine 0.6 MG tablet, Take 0.6 mg by mouth at bedtime. , Disp: , Rfl:  .  fluocinonide (LIDEX) 0.05 % external solution, Apply 1 application topically as needed., Disp: , Rfl:  .  ketoconazole (NIZORAL) 2 % cream, Apply 1 application topically 2 (two) times daily as needed for irritation., Disp: , Rfl:  .  mupirocin ointment (BACTROBAN) 2 %, Apply 1 application topically 3 (three) times daily. (Patient not taking: Reported on 08/19/2016), Disp: 22 g, Rfl: 0   Physical exam:  Vitals:   08/19/16 1408  BP: (!) 147/90  Pulse: 71  Resp: 20  Temp: 98 F (36.7 C)  TempSrc: Tympanic    Weight: (!) 351 lb 13.7 oz (159.6 kg)  Height: 6' 0.05" (1.83 m)   Physical Exam  Constitutional: He is oriented to person, place, and time.  Morbidly obese, no acute distress  HENT:  Head: Normocephalic and atraumatic.  Eyes: EOM are normal. Pupils are equal, round, and reactive to light.  Neck: Normal range of motion.  Cardiovascular: Normal rate, regular rhythm and normal heart sounds.   Pulmonary/Chest: Effort normal and breath sounds normal.  Abdominal: Soft. Bowel sounds are normal.  Neurological: He is alert and oriented to person, place, and time.  Skin: Skin is warm and dry.       CMP Latest Ref Rng & Units 02/14/2016  Glucose 65 - 99 mg/dL 211(H)  BUN 6 - 20 mg/dL 13  Creatinine 0.61 - 1.24 mg/dL 0.56(L)  Sodium 135 - 145 mmol/L 137  Potassium 3.5 - 5.1 mmol/L 3.9  Chloride 101 - 111 mmol/L 104  CO2 22 - 32 mmol/L 27  Calcium 8.9 - 10.3 mg/dL 8.9  Total Protein 6.4 - 8.2 g/dL -  Total Bilirubin 0.2 - 1.0 mg/dL -  Alkaline Phos Unit/L -  AST 15 - 37 Unit/L -  ALT U/L -   CBC Latest Ref Rng & Units 08/16/2015  WBC 3.8 - 10.6 K/uL 8.8  Hemoglobin 13.0 - 18.0 g/dL 13.5  Hematocrit 40.0 - 52.0 % 38.2(L)  Platelets 150 - 440 K/uL 206    No images are attached to the encounter.  No results found.  Assessment and plan- Patient is a 46 y.o. male referred to [place] for elevated ferritin and anemia  1. Elevated ferritin- likely secondary to obesity and metabolic syndrome. LFT's are WNL. I will obtain USG of his liver to see if there is any evidence of NASH/fatty liver. I will repeat iron studies today. Transferrin saturation consistently less then 45%. This is not typically consistent with Herditary hemochromatosis. I do not feel he needs phlebotomy at this time. I will see him back in 2 weeks and discuss this further. I have encouraged him to loose weight which will help with his diabetes and ferritin as well  2. Anemia- his hb is >13 and is therefore not anemic. He does  not need iron supplements. Will check b12 and folate along with iron studies   Thank you for this kind referral and the opportunity to participate in the care of this patient   Visit Diagnosis 1. Elevated ferritin level   2. Anemia, unspecified type     Dr. Randa Evens, MD, MPH [facility] Pager- [phone] 08/19/2016

## 2016-08-19 NOTE — Progress Notes (Signed)
New pt eval

## 2016-08-20 LAB — VITAMIN B12: VITAMIN B 12: 325 pg/mL (ref 180–914)

## 2016-08-20 LAB — IRON AND TIBC
Iron: 53 ug/dL (ref 45–182)
SATURATION RATIOS: 20 % (ref 17.9–39.5)
TIBC: 265 ug/dL (ref 250–450)
UIBC: 212 ug/dL

## 2016-08-20 LAB — FOLATE: FOLATE: 14.4 ng/mL (ref >5.9)

## 2016-08-20 LAB — FERRITIN: Ferritin: 223 ng/mL (ref 24–336)

## 2016-08-26 ENCOUNTER — Ambulatory Visit
Admission: RE | Admit: 2016-08-26 | Discharge: 2016-08-26 | Disposition: A | Discharge status: Home / Self Care | Payer: BC Managed Care – PPO | Source: Ambulatory Visit | Attending: Oncology | Admitting: Oncology

## 2016-08-26 DIAGNOSIS — R7989 Other specified abnormal findings of blood chemistry: Secondary | ICD-10-CM | POA: Diagnosis not present

## 2016-08-26 DIAGNOSIS — R932 Abnormal findings on diagnostic imaging of liver and biliary tract: Secondary | ICD-10-CM | POA: Diagnosis not present

## 2016-08-26 DIAGNOSIS — D649 Anemia, unspecified: Secondary | ICD-10-CM | POA: Diagnosis not present

## 2016-09-02 ENCOUNTER — Inpatient Hospital Stay: Payer: BC Managed Care – PPO | Attending: Oncology | Admitting: Oncology

## 2016-09-02 VITALS — BP 157/90 | HR 72 | Temp 98.0°F | Resp 20 | Ht 72.0 in | Wt 348.8 lb

## 2016-09-02 DIAGNOSIS — R7989 Other specified abnormal findings of blood chemistry: Secondary | ICD-10-CM | POA: Diagnosis not present

## 2016-09-02 DIAGNOSIS — F418 Other specified anxiety disorders: Secondary | ICD-10-CM | POA: Diagnosis not present

## 2016-09-02 DIAGNOSIS — Z7984 Long term (current) use of oral hypoglycemic drugs: Secondary | ICD-10-CM

## 2016-09-02 DIAGNOSIS — G8929 Other chronic pain: Secondary | ICD-10-CM

## 2016-09-02 DIAGNOSIS — E669 Obesity, unspecified: Secondary | ICD-10-CM | POA: Diagnosis not present

## 2016-09-02 DIAGNOSIS — I10 Essential (primary) hypertension: Secondary | ICD-10-CM

## 2016-09-02 DIAGNOSIS — M25561 Pain in right knee: Secondary | ICD-10-CM | POA: Diagnosis not present

## 2016-09-02 DIAGNOSIS — M25562 Pain in left knee: Secondary | ICD-10-CM | POA: Diagnosis not present

## 2016-09-02 DIAGNOSIS — Z9989 Dependence on other enabling machines and devices: Secondary | ICD-10-CM | POA: Diagnosis not present

## 2016-09-02 DIAGNOSIS — Z79899 Other long term (current) drug therapy: Secondary | ICD-10-CM

## 2016-09-02 DIAGNOSIS — Z87891 Personal history of nicotine dependence: Secondary | ICD-10-CM

## 2016-09-02 DIAGNOSIS — E1165 Type 2 diabetes mellitus with hyperglycemia: Secondary | ICD-10-CM | POA: Diagnosis not present

## 2016-09-02 DIAGNOSIS — Z7982 Long term (current) use of aspirin: Secondary | ICD-10-CM

## 2016-09-02 DIAGNOSIS — Z9049 Acquired absence of other specified parts of digestive tract: Secondary | ICD-10-CM | POA: Diagnosis not present

## 2016-09-02 DIAGNOSIS — D649 Anemia, unspecified: Secondary | ICD-10-CM | POA: Diagnosis not present

## 2016-09-02 DIAGNOSIS — G4730 Sleep apnea, unspecified: Secondary | ICD-10-CM

## 2016-09-02 NOTE — Progress Notes (Signed)
Patient here for follow up. Patient states no changes since last appointment. 

## 2016-09-02 NOTE — Progress Notes (Signed)
Hematology/Oncology Consult note [facility]  Telephone:([phone] Fax:([phone]) [phone]  Patient Care Team: Gayland Curry, MD as PCP - General (Family Medicine)   Name of the patient: Jacob [name]  [id number]  [birth year]   Date of visit: 09/02/16  Diagnosis- elevated ferritin likely due to metabolic syndrome  Chief complaint/ Reason for visit- discuss results of bloodwork  Heme/Onc history: Patient is a 46 yr old male with pmh significant for HTN, type 2 DM, obesity and hyperprolactinemia. He has been referred to [place] for elevated ferritin and anemia. Patient has chronically elevated ferritin ranging between 200-335 over last 2 years. Recent ferritin was 335. Transferrin saturation has always been between 22-30%. No family h/o blood disorders/ hemochromatosis. Recent cbc from 06/13/16 showed wbc of 10.2, H/H of 13.6/38.5 and platelet count of 225. CMP was WNL except for elevated blood sugar of 163. Overall he is doing well. Reports chronic b/l knee pain. He has been trying to lose weight. No h/o cardiac issues. Denies other complaints. Patient drinks occasional alcohol about 4-5 times a month  Bloodwork from 08/19/2016 was as follows: CBC showed elevated white count of 11.7 with neutrophilia MI H&H of 14.5/42.1 and a platelet count of 219. CMP was within normal limits except for an elevated blood sugar of 258. Ferritin was normal at 223. B12 folate and iron studies were within normal limits  Interval history- feels well. Denies any complaints   Review of systems- Review of Systems  Constitutional: Negative for chills, fever, malaise/fatigue and weight loss.  HENT: Negative for congestion, ear discharge and nosebleeds.   Eyes: Negative for blurred vision.  Respiratory: Negative for cough, hemoptysis, sputum production, shortness of breath and wheezing.   Cardiovascular: Negative for chest pain, palpitations, orthopnea and claudication.  Gastrointestinal:  Negative for abdominal pain, blood in stool, constipation, diarrhea, heartburn, melena, nausea and vomiting.  Genitourinary: Negative for dysuria, flank pain, frequency, hematuria and urgency.  Musculoskeletal: Negative for back pain, joint pain and myalgias.  Skin: Negative for rash.  Neurological: Negative for dizziness, tingling, focal weakness, seizures, weakness and headaches.  Endo/Heme/Allergies: Does not bruise/bleed easily.  Psychiatric/Behavioral: Negative for depression and suicidal ideas. The patient does not have insomnia.      Current treatment- observation  Allergies  Allergen Reactions  . Hydrocortisone Rash and Dermatitis    Topical Creams Topical Creams Topical Creams Topical Creams Topical Creams     Past Medical History:  Diagnosis Date  . 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 Anxiety   . Depression   . Diabetes ([place])   . Gout   . Heart murmur   . Hypertension   . Obesity   . Sleep apnea    uses cpap     Past Surgical History:  Procedure Laterality Date  . CHOLECYSTECTOMY    . KNEE ARTHROSCOPY Right 08/23/2015   Procedure: ARTHROSCOPY KNEE, partial medial and lateral menisectomy, medial and lateral chondolar microfracture.;  Surgeon: Leanor Kail, MD;  Location: [facility] ORS;  Service: Orthopedics;  Laterality: Right;  . KNEE ARTHROSCOPY Left 02/21/2016   Procedure: ARTHROSCOPY KNEE;  Surgeon: Leanor Kail, MD;  Location: [facility] ORS;  Service: Orthopedics;  Laterality: Left;  . TONSILLECTOMY    . TONSILLECTOMY AND ADENOIDECTOMY    . UVULOPALATOPHARYNGOPLASTY    . WISDOM TOOTH EXTRACTION      Social History   Social History  . Marital status: Married    Spouse name:  N/A  . Number of children: N/A  . Years of education: N/A   Occupational History  . Not on file.   Social History Main Topics  . Smoking status: Former Smoker    Packs/day: 1.00    Years: 10.00    Types: Cigarettes     Quit date: 04/29/1993  . Smokeless tobacco: Former Systems developer    Types: Chew    Quit date: 05/12/2015  . Alcohol use 0.0 oz/week     Comment: rarely  . Drug use: No  . Sexual activity: Yes   Other Topics Concern  . Not on file   Social History Narrative  . No narrative on file    Family History  Problem Relation Age of Onset  . Lung cancer Father   . Thyroid disease Mother   . Thyroid disease Sister      Current Outpatient Prescriptions:  .  allopurinol (ZYLOPRIM) 100 MG tablet, Take 100 mg by mouth at bedtime. , Disp: , Rfl:  .  aspirin EC 81 MG tablet, Take 81 mg by mouth daily., Disp: , Rfl:  .  atorvastatin (LIPITOR) 20 MG tablet, , Disp: , Rfl:  .  colchicine 0.6 MG tablet, Take 0.6 mg by mouth at bedtime. , Disp: , Rfl:  .  fluocinonide (LIDEX) 0.05 % external solution, Apply 1 application topically as needed., Disp: , Rfl:  .  FLUoxetine (PROZAC) 20 MG capsule, Take 20 mg by mouth at bedtime. , Disp: , Rfl:  .  fluticasone (FLONASE) 50 MCG/ACT nasal spray, Place into both nostrils daily., Disp: , Rfl:  .  glucose blood (ONE TOUCH ULTRA TEST) test strip, , Disp: , Rfl:  .  ketoconazole (NIZORAL) 2 % cream, Apply 1 application topically 2 (two) times daily as needed for irritation., Disp: , Rfl:  .  lisinopril (PRINIVIL,ZESTRIL) 5 MG tablet, Take 5 mg by mouth at bedtime. , Disp: , Rfl:  .  metFORMIN (GLUMETZA) 500 MG (MOD) 24 hr tablet, Take 1,000 mg by mouth daily with breakfast. , Disp: , Rfl:  .  mupirocin ointment (BACTROBAN) 2 %, Apply 1 application topically 3 (three) times daily. (Patient not taking: Reported on 08/19/2016), Disp: 22 g, Rfl: 0 .  ONETOUCH DELICA LANCETS FINE MISC, , Disp: , Rfl:  .  predniSONE (STERAPRED UNI-PAK 21 TAB) 10 MG (21) TBPK tablet, Take by mouth daily. Take 6 tabs by mouth daily  for 2 days, then 5 tabs for 2 days, then 4 tabs for 2 days, then 3 tabs for 2 days, 2 tabs for 2 days, then 1 tab by mouth daily for 2 days, Disp: 42 tablet, Rfl:  0  Physical exam:  Vitals:   09/02/16 1501 09/02/16 1505  BP: (!) 157/90   Pulse: 72   Resp:  20  Temp: 98 F (36.7 C)   TempSrc: Tympanic   Weight: (!) 348 lb 12.3 oz (158.2 kg)   Height: 6' (1.829 m)    Physical Exam  Constitutional: He is oriented to person, place, and time.  Obese, appears in no acute distress  HENT:  Head: Normocephalic and atraumatic.  Eyes: EOM are normal. Pupils are equal, round, and reactive to light.  Neck: Normal range of motion.  Cardiovascular: Normal rate, regular rhythm and normal heart sounds.   Pulmonary/Chest: Effort normal and breath sounds normal.  Abdominal: Soft. Bowel sounds are normal.  Neurological: He is alert and oriented to person, place, and time.  Skin: Skin is warm and dry.  CMP Latest Ref Rng & Units 08/19/2016  Glucose 65 - 99 mg/dL 258(H)  BUN 6 - 20 mg/dL 12  Creatinine 0.61 - 1.24 mg/dL 0.73  Sodium 135 - 145 mmol/L 136  Potassium 3.5 - 5.1 mmol/L 3.5  Chloride 101 - 111 mmol/L 103  CO2 22 - 32 mmol/L 26  Calcium 8.9 - 10.3 mg/dL 8.5(L)  Total Protein 6.5 - 8.1 g/dL 7.4  Total Bilirubin 0.3 - 1.2 mg/dL 0.8  Alkaline Phos 38 - 126 U/L 88  AST 15 - 41 U/L 18  ALT 17 - 63 U/L 28   CBC Latest Ref Rng & Units 08/19/2016  WBC 3.8 - 10.6 K/uL 11.7(H)  Hemoglobin 13.0 - 18.0 g/dL 14.5  Hematocrit 40.0 - 52.0 % 42.1  Platelets 150 - 440 K/uL 219    No images are attached to the encounter.  US Abdomen Limited Ruq  Result Date: 08/26/2016 CLINICAL DATA:  Anemia, elevated ferritin level, previous cholecystectomy. EXAM: US ABDOMEN LIMITED - RIGHT UPPER QUADRANT COMPARISON:  Abdominal ultrasound of August 09, 2008 FINDINGS: Gallbladder: The gallbladder is surgically absent. Common bile duct: Diameter: 3.7 mm Liver: The hepatic echotexture is increased diffusely. There is no discrete mass nor ductal dilation. The surface contour of the liver is subjectively mildly irregular. IMPRESSION: Increased hepatic echotexture may  reflect fatty infiltrative change or other hepatocellular disease. Given the subtle surface contour irregularity and known elevated ferritin level, hepatic protocol MRI is recommended to exclude cirrhosis or hemochromatosis. Electronically Signed   By: Kelden  Martinique M.D.   On: 08/26/2016 09:16     Assessment and plan- Patient is a 46 y.o. male referred to [place] for elevate ferritin likely due to metabolic syndrome  Patient repeat ferritin checked in our office was within normal limits. He had some elevated ferritin in the past likely due to metabolic syndrome- given obesity, hyperglycemia and HTN. USG RUQ did show possible fatty liver. I do not think patient has cirrhosis or evidence of hepatic iron overload based on USG. However, I will get MRI abdomen to rule that out. Patients transferrin saturation is 20% and I do not think patient has hemochromatosis. He does not need phlebotomy at this time. Encouraged him to work on his weight loss and blood sugar control  I will see him back in 6 months with repeat cbc, cmp and ferritin     Visit Diagnosis 1. Elevated ferritin      Dr. Randa Evens, MD, MPH [place] [facility] Pager- [phone] 09/02/2016 4:00 PM

## 2016-09-12 ENCOUNTER — Ambulatory Visit: Admission: RE | Admit: 2016-09-12 | Payer: BC Managed Care – PPO | Source: Ambulatory Visit

## 2016-09-17 IMAGING — CR DG ORBITS FOR FOREIGN BODY
2 series · 2 of 2 positions shown · non-contrast
Comparison: None.

CLINICAL DATA: Metal working/exposure; clearance prior to MRI

EXAM:
ORBITS FOR FOREIGN BODY - 2 VIEW

[w orbit pa (1 of 2)]
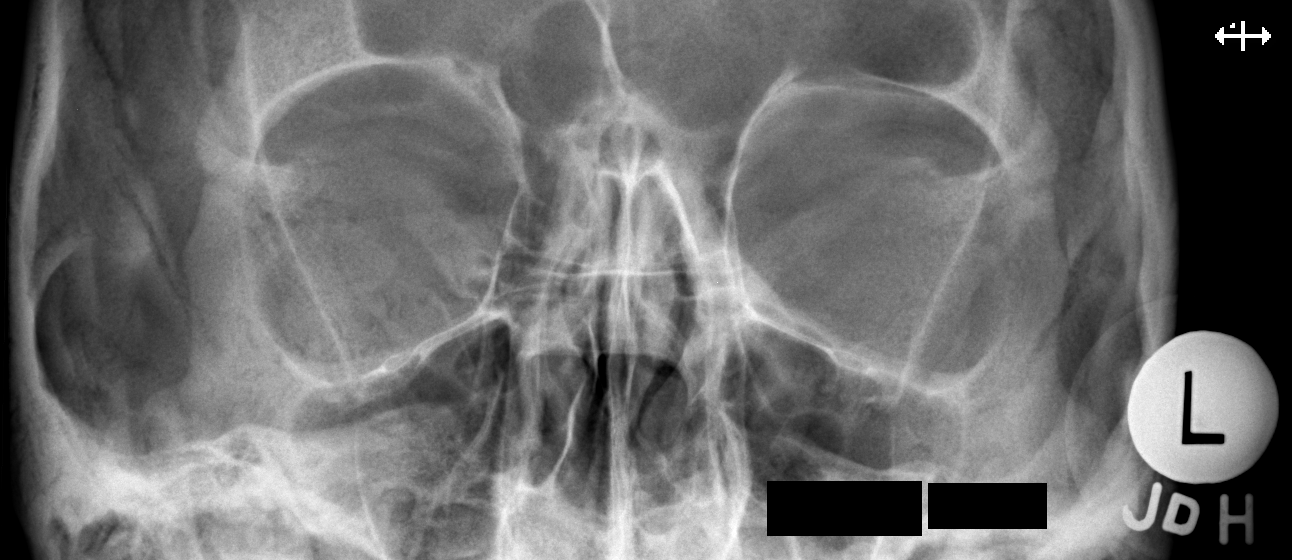

[w orbit pa (2 of 2)]
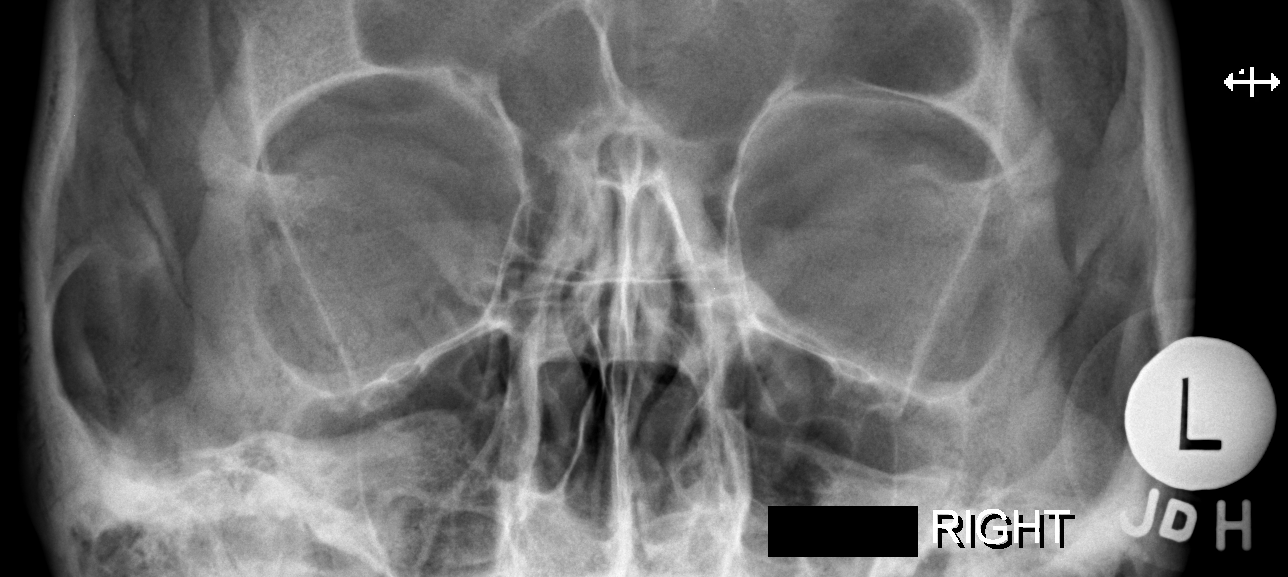

[2 of 2 positions shown; findings below may reference images not displayed]

FINDINGS: There is no evidence of metallic foreign body within the orbits. No
significant bone abnormality identified.
IMPRESSION: No evidence of metallic foreign body within the orbits.

## 2016-09-21 ENCOUNTER — Ambulatory Visit (HOSPITAL_COMMUNITY)
Admission: RE | Admit: 2016-09-21 | Discharge: 2016-09-21 | Disposition: A | Discharge status: Home / Self Care | Payer: BC Managed Care – PPO | Source: Ambulatory Visit | Attending: Oncology | Admitting: Oncology

## 2016-09-21 DIAGNOSIS — R162 Hepatomegaly with splenomegaly, not elsewhere classified: Secondary | ICD-10-CM | POA: Insufficient documentation

## 2016-09-21 DIAGNOSIS — K760 Fatty (change of) liver, not elsewhere classified: Secondary | ICD-10-CM | POA: Insufficient documentation

## 2016-09-21 DIAGNOSIS — R7989 Other specified abnormal findings of blood chemistry: Secondary | ICD-10-CM | POA: Insufficient documentation

## 2016-09-21 MED ORDER — GADOBENATE DIMEGLUMINE 529 MG/ML IV SOLN
20.0000 mL | Freq: Once | INTRAVENOUS | Status: AC | PRN
  Administered 2016-09-21: 20 mL via INTRAVENOUS

## 2016-09-24 ENCOUNTER — Other Ambulatory Visit: Payer: Self-pay | Admitting: Family Medicine

## 2016-09-24 DIAGNOSIS — R319 Hematuria, unspecified: Secondary | ICD-10-CM

## 2016-09-24 DIAGNOSIS — R109 Unspecified abdominal pain: Secondary | ICD-10-CM

## 2016-09-25 ENCOUNTER — Ambulatory Visit: Payer: BC Managed Care – PPO

## 2016-09-30 ENCOUNTER — Ambulatory Visit
Admission: RE | Admit: 2016-09-30 | Discharge: 2016-09-30 | Disposition: A | Discharge status: Home / Self Care | Payer: BC Managed Care – PPO | Source: Ambulatory Visit | Attending: Family Medicine | Admitting: Family Medicine

## 2016-09-30 DIAGNOSIS — N200 Calculus of kidney: Secondary | ICD-10-CM | POA: Diagnosis not present

## 2016-09-30 DIAGNOSIS — R319 Hematuria, unspecified: Secondary | ICD-10-CM | POA: Insufficient documentation

## 2016-09-30 DIAGNOSIS — R109 Unspecified abdominal pain: Secondary | ICD-10-CM | POA: Insufficient documentation

## 2017-02-28 ENCOUNTER — Inpatient Hospital Stay: Payer: BC Managed Care – PPO | Attending: Oncology

## 2017-02-28 DIAGNOSIS — Z7982 Long term (current) use of aspirin: Secondary | ICD-10-CM | POA: Insufficient documentation

## 2017-02-28 DIAGNOSIS — M25562 Pain in left knee: Secondary | ICD-10-CM | POA: Insufficient documentation

## 2017-02-28 DIAGNOSIS — Z79899 Other long term (current) drug therapy: Secondary | ICD-10-CM | POA: Diagnosis not present

## 2017-02-28 DIAGNOSIS — R1032 Left lower quadrant pain: Secondary | ICD-10-CM | POA: Diagnosis not present

## 2017-02-28 DIAGNOSIS — F418 Other specified anxiety disorders: Secondary | ICD-10-CM | POA: Insufficient documentation

## 2017-02-28 DIAGNOSIS — E1165 Type 2 diabetes mellitus with hyperglycemia: Secondary | ICD-10-CM | POA: Diagnosis not present

## 2017-02-28 DIAGNOSIS — G4730 Sleep apnea, unspecified: Secondary | ICD-10-CM | POA: Insufficient documentation

## 2017-02-28 DIAGNOSIS — M25561 Pain in right knee: Secondary | ICD-10-CM | POA: Diagnosis not present

## 2017-02-28 DIAGNOSIS — R162 Hepatomegaly with splenomegaly, not elsewhere classified: Secondary | ICD-10-CM | POA: Diagnosis not present

## 2017-02-28 DIAGNOSIS — E669 Obesity, unspecified: Secondary | ICD-10-CM | POA: Diagnosis not present

## 2017-02-28 DIAGNOSIS — Z87891 Personal history of nicotine dependence: Secondary | ICD-10-CM | POA: Insufficient documentation

## 2017-02-28 DIAGNOSIS — M109 Gout, unspecified: Secondary | ICD-10-CM | POA: Insufficient documentation

## 2017-02-28 DIAGNOSIS — R7989 Other specified abnormal findings of blood chemistry: Secondary | ICD-10-CM | POA: Insufficient documentation

## 2017-02-28 DIAGNOSIS — G8929 Other chronic pain: Secondary | ICD-10-CM | POA: Insufficient documentation

## 2017-02-28 DIAGNOSIS — E119 Type 2 diabetes mellitus without complications: Secondary | ICD-10-CM | POA: Insufficient documentation

## 2017-02-28 DIAGNOSIS — D649 Anemia, unspecified: Secondary | ICD-10-CM | POA: Diagnosis not present

## 2017-02-28 DIAGNOSIS — K760 Fatty (change of) liver, not elsewhere classified: Secondary | ICD-10-CM | POA: Diagnosis not present

## 2017-02-28 DIAGNOSIS — Z801 Family history of malignant neoplasm of trachea, bronchus and lung: Secondary | ICD-10-CM | POA: Insufficient documentation

## 2017-02-28 DIAGNOSIS — Z9989 Dependence on other enabling machines and devices: Secondary | ICD-10-CM | POA: Diagnosis not present

## 2017-02-28 DIAGNOSIS — I10 Essential (primary) hypertension: Secondary | ICD-10-CM | POA: Diagnosis not present

## 2017-02-28 LAB — FERRITIN: Ferritin: 295 ng/mL (ref 24–336)

## 2017-02-28 LAB — COMPREHENSIVE METABOLIC PANEL
ALK PHOS: 98 U/L (ref 38–126)
ALT: 32 U/L (ref 17–63)
ANION GAP: 7 (ref 5–15)
AST: 24 U/L (ref 15–41)
Albumin: 3.9 g/dL (ref 3.5–5.0)
BILIRUBIN TOTAL: 0.9 mg/dL (ref 0.3–1.2)
BUN: 12 mg/dL (ref 6–20)
CALCIUM: 8.7 mg/dL — AB (ref 8.9–10.3)
CO2: 26 mmol/L (ref 22–32)
CREATININE: 0.8 mg/dL (ref 0.61–1.24)
Chloride: 104 mmol/L (ref 101–111)
Glucose, Bld: 179 mg/dL — ABNORMAL HIGH (ref 65–99)
Potassium: 4 mmol/L (ref 3.5–5.1)
SODIUM: 137 mmol/L (ref 135–145)
TOTAL PROTEIN: 7.4 g/dL (ref 6.5–8.1)

## 2017-02-28 LAB — CBC
HEMATOCRIT: 40.9 % (ref 40.0–52.0)
HEMOGLOBIN: 14.6 g/dL (ref 13.0–18.0)
MCH: 31.1 pg (ref 26.0–34.0)
MCHC: 35.7 g/dL (ref 32.0–36.0)
MCV: 87.2 fL (ref 80.0–100.0)
Platelets: 220 10*3/uL (ref 150–440)
RBC: 4.69 MIL/uL (ref 4.40–5.90)
RDW: 13 % (ref 11.5–14.5)
WBC: 10.4 10*3/uL (ref 3.8–10.6)

## 2017-02-28 LAB — IRON AND TIBC
IRON: 70 ug/dL (ref 45–182)
SATURATION RATIOS: 28 % (ref 17.9–39.5)
TIBC: 247 ug/dL — AB (ref 250–450)
UIBC: 177 ug/dL

## 2017-03-03 ENCOUNTER — Other Ambulatory Visit: Payer: BC Managed Care – PPO

## 2017-03-03 ENCOUNTER — Inpatient Hospital Stay (HOSPITAL_BASED_OUTPATIENT_CLINIC_OR_DEPARTMENT_OTHER): Payer: BC Managed Care – PPO | Admitting: Oncology

## 2017-03-03 ENCOUNTER — Encounter: Payer: Self-pay | Admitting: Oncology

## 2017-03-03 VITALS — BP 145/82 | HR 78 | Temp 98.0°F | Resp 16 | Wt 347.9 lb

## 2017-03-03 DIAGNOSIS — E119 Type 2 diabetes mellitus without complications: Secondary | ICD-10-CM | POA: Diagnosis not present

## 2017-03-03 DIAGNOSIS — M25561 Pain in right knee: Secondary | ICD-10-CM

## 2017-03-03 DIAGNOSIS — R162 Hepatomegaly with splenomegaly, not elsewhere classified: Secondary | ICD-10-CM

## 2017-03-03 DIAGNOSIS — E669 Obesity, unspecified: Secondary | ICD-10-CM

## 2017-03-03 DIAGNOSIS — R1032 Left lower quadrant pain: Secondary | ICD-10-CM

## 2017-03-03 DIAGNOSIS — Z87891 Personal history of nicotine dependence: Secondary | ICD-10-CM

## 2017-03-03 DIAGNOSIS — E1165 Type 2 diabetes mellitus with hyperglycemia: Secondary | ICD-10-CM

## 2017-03-03 DIAGNOSIS — D649 Anemia, unspecified: Secondary | ICD-10-CM

## 2017-03-03 DIAGNOSIS — R7989 Other specified abnormal findings of blood chemistry: Secondary | ICD-10-CM | POA: Diagnosis not present

## 2017-03-03 DIAGNOSIS — Z9989 Dependence on other enabling machines and devices: Secondary | ICD-10-CM

## 2017-03-03 DIAGNOSIS — Z7982 Long term (current) use of aspirin: Secondary | ICD-10-CM

## 2017-03-03 DIAGNOSIS — I10 Essential (primary) hypertension: Secondary | ICD-10-CM | POA: Diagnosis not present

## 2017-03-03 DIAGNOSIS — K760 Fatty (change of) liver, not elsewhere classified: Secondary | ICD-10-CM | POA: Diagnosis not present

## 2017-03-03 DIAGNOSIS — M109 Gout, unspecified: Secondary | ICD-10-CM | POA: Diagnosis not present

## 2017-03-03 DIAGNOSIS — G4730 Sleep apnea, unspecified: Secondary | ICD-10-CM

## 2017-03-03 DIAGNOSIS — M25562 Pain in left knee: Secondary | ICD-10-CM

## 2017-03-03 DIAGNOSIS — F418 Other specified anxiety disorders: Secondary | ICD-10-CM

## 2017-03-03 DIAGNOSIS — Z79899 Other long term (current) drug therapy: Secondary | ICD-10-CM

## 2017-03-03 DIAGNOSIS — Z801 Family history of malignant neoplasm of trachea, bronchus and lung: Secondary | ICD-10-CM

## 2017-03-03 DIAGNOSIS — G8929 Other chronic pain: Secondary | ICD-10-CM

## 2017-03-03 NOTE — Progress Notes (Signed)
Hematology/Oncology Consult note [facility]  Telephone:([phone] Fax:([phone]  Patient Care Team: Gayland Curry, MD as PCP - General (Family Medicine)   Name of the patient: Jacob [name]  [id number]  1970-07-19   Date of visit: 03/03/17  Diagnosis- elevated ferritin likely due to metabolic syndrome   Chief complaint/ Reason for visit- routine f/u for elevated ferritin  Heme/Onc history: Patient is a 46 yr old male with pmh significant for HTN, type 2 DM, obesity and hyperprolactinemia. He has been referred to [place] for elevated ferritin and anemia. Patient has chronically elevated ferritin ranging between 200-335 over last 2 years. Recent ferritin was 335. Transferrin saturation has always been between 22-30%. No family h/o blood disorders/ hemochromatosis. Recent cbc from 06/13/16 showed wbc of 10.2, H/H of 13.6/38.5 and platelet count of 225. CMP was WNL except for elevated blood sugar of 163. Overall he is doing well. Reports chronic b/l knee pain. He has been trying to lose weight. No h/o cardiac issues. Denies other complaints. Patient drinks occasional alcohol about 4-5 times a month  Bloodwork from 08/19/2016 was as follows: CBC showed elevated white count of 11.7 with neutrophilia MI H&H of 14.5/42.1 and a platelet count of 219. CMP was within normal limits except for an elevated blood sugar of 258. Ferritin was normal at 223. B12 folate and iron studies were within normal limits  US abdomen showed fatty liver. Given the subtle surface contour irregularity and known elevated ferritin level, hepatic protocol MRI is recommended to exclude cirrhosis or hemochromatosis.  MRI abdomen showed;  1. Hepatomegaly with hepatic steatosis. 2. Mild splenomegaly. 3. Mildly enlarged hepatic duodenal ligament lymph node. This is nonspecific, and could be related to underlying hepatocellular disease. 4. 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  Interval history- Patient reports pain in his left groin ongoing for last 1 week. Denies any trauma  ECOG PS- 0 Pain scale- 0   Review of systems- Review of Systems  Constitutional: Negative for chills, fever, malaise/fatigue and weight loss.  HENT: Negative for congestion, ear discharge and nosebleeds.   Eyes: Negative for blurred vision.  Respiratory: Negative for cough, hemoptysis, sputum production, shortness of breath and wheezing.   Cardiovascular: Negative for chest pain, palpitations, orthopnea and claudication.  Gastrointestinal: Negative for abdominal pain, blood in stool, constipation, diarrhea, heartburn, melena, nausea and vomiting.  Genitourinary: Negative for dysuria, flank pain, frequency, hematuria and urgency.  Musculoskeletal: Negative for back pain, joint pain and myalgias.  Skin: Negative for rash.  Neurological: Negative for dizziness, tingling, focal weakness, seizures, weakness and headaches.  Endo/Heme/Allergies: Does not bruise/bleed easily.  Psychiatric/Behavioral: Negative for depression and suicidal ideas. The patient does not have insomnia.       Allergies  Allergen Reactions  . Hydrocortisone Rash and Dermatitis    Topical Creams Topical Creams Topical Creams Topical Creams Topical Creams     Past Medical History:  Diagnosis Date  . 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 Anxiety   . Depression   . Diabetes ([place])   . Gout   . Heart murmur   . Hypertension   . Obesity   . Sleep apnea    uses cpap     Past Surgical History:  Procedure Laterality Date  . CHOLECYSTECTOMY    . TONSILLECTOMY    . TONSILLECTOMY AND ADENOIDECTOMY    . UVULOPALATOPHARYNGOPLASTY    . WISDOM TOOTH  EXTRACTION      Social History   Socioeconomic History  . Marital status: Married    Spouse name: Not on file  . Number of children: Not on file  . Years of education: Not on  file  . Highest education level: Not on file  Social Needs  . Financial resource strain: Not on file  . Food insecurity - worry: Not on file  . Food insecurity - inability: Not on file  . Transportation needs - medical: Not on file  . Transportation needs - non-medical: Not on file  Occupational History  . Not on file  Tobacco Use  . Smoking status: Former Smoker    Packs/day: 1.00    Years: 10.00    Pack years: 10.00    Types: Cigarettes    Last attempt to quit: 04/29/1993    Years since quitting: 23.8  . Smokeless tobacco: Former Systems developer    Types: [place] date: 05/12/2015  Substance and Sexual Activity  . Alcohol use: Yes    Alcohol/week: 0.0 oz    Comment: rarely  . Drug use: No  . Sexual activity: Yes  Other Topics Concern  . Not on file  Social History Narrative  . Not on file    Family History  Problem Relation Age of Onset  . Lung cancer Father   . Thyroid disease Mother   . Thyroid disease Sister      Current Outpatient Medications:  .  allopurinol (ZYLOPRIM) 100 MG tablet, Take 100 mg by mouth at bedtime. , Disp: , Rfl:  .  aspirin EC 81 MG tablet, Take 81 mg by mouth daily., Disp: , Rfl:  .  atorvastatin (LIPITOR) 20 MG tablet, , Disp: , Rfl:  .  colchicine 0.6 MG tablet, Take 0.6 mg by mouth at bedtime. , Disp: , Rfl:  .  fluocinonide (LIDEX) 0.05 % external solution, Apply 1 application topically as needed., Disp: , Rfl:  .  FLUoxetine (PROZAC) 20 MG capsule, Take 20 mg by mouth at bedtime. , Disp: , Rfl:  .  fluticasone (FLONASE) 50 MCG/ACT nasal spray, Place into both nostrils daily., Disp: , Rfl:  .  glucose blood (ONE TOUCH ULTRA TEST) test strip, , Disp: , Rfl:  .  ketoconazole (NIZORAL) 2 % cream, Apply 1 application topically 2 (two) times daily as needed for irritation., Disp: , Rfl:  .  lisinopril (PRINIVIL,ZESTRIL) 5 MG tablet, Take 5 mg by mouth at bedtime. , Disp: , Rfl:  .  metFORMIN (GLUMETZA) 500 MG (MOD) 24 hr tablet, Take 1,000 mg by  mouth daily with breakfast. , Disp: , Rfl:  .  mupirocin ointment (BACTROBAN) 2 %, Apply 1 application topically 3 (three) times daily., Disp: 22 g, Rfl: 0 .  ONETOUCH DELICA LANCETS FINE MISC, , Disp: , Rfl:   Physical exam:  Vitals:   03/03/17 1408 03/03/17 1428  BP: (!) 145/82   Pulse: 78   Resp: 16   Temp: 98 F (36.7 C)   TempSrc: Tympanic   Weight:  (!) 347 lb 14.2 oz (157.8 kg)   Physical Exam  Constitutional: He is oriented to person, place, and time.  Obese. Appears in no acute distress  HENT:  Head: Normocephalic and atraumatic.  Eyes: EOM are normal. Pupils are equal, round, and reactive to light.  Neck: Normal range of motion.  Cardiovascular: Normal rate, regular rhythm and normal heart sounds.  Pulmonary/Chest: Effort normal and breath sounds normal.  Abdominal: Soft. Bowel  sounds are normal.  Genitourinary:  Genitourinary Comments: I do not feel any mass/ swelling in bilateral testicles.   Neurological: He is alert and oriented to person, place, and time.  Skin: Skin is warm and dry.     CMP Latest Ref Rng & Units 02/28/2017  Glucose 65 - 99 mg/dL 179(H)  BUN 6 - 20 mg/dL 12  Creatinine 0.61 - 1.24 mg/dL 0.80  Sodium 135 - 145 mmol/L 137  Potassium 3.5 - 5.1 mmol/L 4.0  Chloride 101 - 111 mmol/L 104  CO2 22 - 32 mmol/L 26  Calcium 8.9 - 10.3 mg/dL 8.7(L)  Total Protein 6.5 - 8.1 g/dL 7.4  Total Bilirubin 0.3 - 1.2 mg/dL 0.9  Alkaline Phos 38 - 126 U/L 98  AST 15 - 41 U/L 24  ALT 17 - 63 U/L 32   CBC Latest Ref Rng & Units 02/28/2017  WBC 3.8 - 10.6 K/uL 10.4  Hemoglobin 13.0 - 18.0 g/dL 14.6  Hematocrit 40.0 - 52.0 % 40.9  Platelets 150 - 440 K/uL 220      Assessment and plan- Patient is a 46 y.o. male referred for elevated ferritin  Repeat ferritin checked last week was normal at 295. Cbc was normal. CMP showed elevated blood sugars and normal LFT's. Patient does not have elevated ferritin and does not need hematology follow up at this  time  Left groin pain- no palpable mass/ swelling in the left testicle of along the inguinal canal. Patient had a CT renal stone study in June 2018 which showed left testicle in left inguinal canal which may be suggestive of incomplete descent. We will inform Dr. Astrid Divine about these symptoms for further management as he will not be following up with hematology at this point  He does have fatty liver which can be monitored by pcp. Also has hyperglycemia which needs to be looked into by Dr. Astrid Divine   Visit Diagnosis 1. Elevated ferritin      Dr. Randa Evens, MD, MPH [facility] Pager- [phone] 03/03/2017 1:54 PM

## 2017-03-03 NOTE — Progress Notes (Signed)
Patient here for follow up with lab results. He states that he has been having left sided groin and testicle pain for the past week. He states that he has not had any injury or activity to cause the pain.

## 2017-11-10 ENCOUNTER — Ambulatory Visit
Admission: EM | Admit: 2017-11-10 | Discharge: 2017-11-10 | Disposition: A | Discharge status: Home / Self Care | Payer: BC Managed Care – PPO | Attending: Family Medicine | Admitting: Family Medicine

## 2017-11-10 ENCOUNTER — Other Ambulatory Visit: Payer: Self-pay

## 2017-11-10 DIAGNOSIS — G44209 Tension-type headache, unspecified, not intractable: Secondary | ICD-10-CM | POA: Diagnosis not present

## 2017-11-10 DIAGNOSIS — I10 Essential (primary) hypertension: Secondary | ICD-10-CM | POA: Diagnosis not present

## 2017-11-10 MED ORDER — TRAMADOL HCL 50 MG PO TABS: ORAL_TABLET | ORAL | 0 refills | Status: DC

## 2017-11-10 MED ORDER — CYCLOBENZAPRINE HCL 10 MG PO TABS: 10.0000 mg | ORAL_TABLET | Freq: Every day | ORAL | 0 refills | Status: DC

## 2017-11-10 NOTE — Discharge Instructions (Signed)
Increase lisinopril to 10mg  daily Follow up with primary care provider

## 2017-11-10 NOTE — ED Provider Notes (Signed)
[facility]    CSN: [id number] Arrival date & time: 11/10/17  1214     History   Chief Complaint Chief Complaint  Patient presents with  . Headache    HPI HARVEL MESKILL is a 47 y.o. male.   The history is provided by the patient.  Headache  Pain location:  Generalized Quality:  Dull Radiates to:  Does not radiate Severity currently:  6/10 Severity at highest:  8/10 Onset quality:  Sudden Duration:  5 days Timing:  Constant Progression:  Unchanged Chronicity:  New Similar to prior headaches: yes   Context: not activity, not exposure to bright light, not caffeine, not coughing, not defecating, not eating, not stress, not exposure to cold air, not intercourse, not loud noise and not straining   Relieved by:  Nothing Ineffective treatments:  Acetaminophen and NSAIDs Associated symptoms: no abdominal pain, no back pain, no blurred vision, no congestion, no cough, no diarrhea, no dizziness, no drainage, no ear pain, no eye pain, no facial pain, no fatigue, no fever, no focal weakness, no hearing loss, no loss of balance, no myalgias, no nausea, no near-syncope, no neck pain, no neck stiffness, no numbness, no paresthesias, no photophobia, no seizures, no sinus pressure, no sore throat, no swollen glands, no syncope, no tingling, no URI, no visual change, no vomiting and no weakness   Risk factors: sedentary lifestyle   Risk factors: no anger, no family hx of SAH and does not have insomnia     Past Medical History:  Diagnosis Date  . Anxiety   . Depression   . Diabetes ([place])   . Gout   . Heart murmur   . Hypertension   . Obesity   . Pituitary adenoma ([place])   . Sleep apnea    uses cpap    Patient Active Problem List   Diagnosis Date Noted  . Non-pressure chronic ulcer of left ankle, limited to breakdown of skin ([place]) 04/25/2015  . HYPERTENSION 02/22/2008  . SLEEP APNEA 02/22/2008  . PITUITARY NEOPLASM, HX OF 02/22/2008    Past Surgical History:    Procedure Laterality Date  . CHOLECYSTECTOMY    . KNEE ARTHROSCOPY Right 08/23/2015   Procedure: ARTHROSCOPY KNEE, partial medial and lateral menisectomy, medial and lateral chondolar microfracture.;  Surgeon: Leanor Kail, MD;  Location: [facility] ORS;  Service: Orthopedics;  Laterality: Right;  . KNEE ARTHROSCOPY Left 02/21/2016   Procedure: ARTHROSCOPY KNEE;  Surgeon: Leanor Kail, MD;  Location: [facility] OR[facility];  Service: Orthopedics;  Laterality: Left;  . TONSILLECTOMY    . TONSILLECTOMY AND ADENOIDECTOMY    . UVULOPALATOPHARYNGOPLASTY    . WISDOM TOOTH EXTRACTION         Home Medications    Prior to Admission medications   Medication Sig Start Date End Date Taking? Authorizing Provider  allopurinol (ZYLOPRIM) 100 MG tablet Take 100 mg by mouth at bedtime.    Yes [provider]  aspirin EC 81 MG tablet Take 81 mg by mouth daily.   Yes [provider]  atorvastatin (LIPITOR) 20 MG tablet  06/18/16  Yes [provider]  colchicine 0.6 MG tablet Take 0.6 mg by mouth at bedtime.    Yes [provider]  fluocinonide (LIDEX) 0.05 % external solution Apply 1 application topically as needed.   Yes [provider]  FLUoxetine (PROZAC) 20 MG capsule Take 20 mg by mouth at bedtime.    Yes [provider]  fluticasone (FLONASE) 50 MCG/ACT nasal spray Place into  both nostrils daily.   Yes [provider]  glucose blood (ONE TOUCH ULTRA TEST) test strip  12/06/15  Yes [provider]  ketoconazole (NIZORAL) 2 % cream Apply 1 application topically 2 (two) times daily as needed for irritation.   Yes [provider]  lisinopril (PRINIVIL,ZESTRIL) 5 MG tablet Take 5 mg by mouth at bedtime.    Yes [provider]  metFORMIN (GLUMETZA) 500 MG (MOD) 24 hr tablet Take 1,500 mg at bedtime by mouth.    Yes [provider]  cyclobenzaprine (FLEXERIL) 10 MG tablet Take 1 tablet (10 mg total) by mouth at bedtime.  11/10/17   Norval Gable, MD  mupirocin ointment (BACTROBAN) 2 % Apply 1 application topically 3 (three) times daily. 08/06/16   Lorin Picket, PA-C  ONETOUCH DELICA LANCETS FINE MISC  12/06/15   [provider]  traMADol Veatrice Bourbon) 50 MG tablet 1-2 tabs po q 8 hours prn 11/10/17   Norval Gable, MD    Family History Family History  Problem Relation Age of Onset  . Lung cancer Father   . Thyroid disease Mother   . Thyroid disease Sister     Social History Social History   Tobacco Use  . Smoking status: Former Smoker    Packs/day: 1.00    Years: 10.00    Pack years: 10.00    Types: Cigarettes    Last attempt to quit: 04/29/1993    Years since quitting: 24.5  . Smokeless tobacco: Former Systems developer    Types: Chew    Quit date: 05/12/2015  Substance Use Topics  . Alcohol use: Yes    Alcohol/week: 0.0 oz    Comment: rarely  . Drug use: No     Allergies   Hydrocortisone   Review of Systems Review of Systems  Constitutional: Negative for fatigue and fever.  HENT: Negative for congestion, ear pain, hearing loss, postnasal drip, sinus pressure and sore throat.   Eyes: Negative for blurred vision, photophobia and pain.  Respiratory: Negative for cough.   Cardiovascular: Negative for syncope and near-syncope.  Gastrointestinal: Negative for abdominal pain, diarrhea, nausea and vomiting.  Musculoskeletal: Negative for back pain, myalgias, neck pain and neck stiffness.  Neurological: Positive for headaches. Negative for dizziness, focal weakness, seizures, weakness, numbness, paresthesias and loss of balance.     Physical Exam Triage Vital Signs ED Triage Vitals  Enc Vitals Group     BP 11/10/17 1239 (!) 181/95     Pulse Rate 11/10/17 1239 67     Resp 11/10/17 1239 18     Temp 11/10/17 1239 98.9 F (37.2 C)     Temp Source 11/10/17 1239 Oral     SpO2 11/10/17 1239 98 %     Weight 11/10/17 1237 (!) 355 lb (161 kg)     Height 11/10/17 1237 6' (1.829 m)     Head  Circumference --      Peak Flow --      Pain Score 11/10/17 1237 6     Pain Loc --      Pain Edu? --      Excl. in [place]? --    No data found.  Updated Vital Signs BP (!) 181/95 (BP Location: Left Arm)   Pulse 67   Temp 98.9 F (37.2 C) (Oral)   Resp 18   Ht 6' (1.829 m)   Wt (!) 355 lb (161 kg)   SpO2 98%   BMI 48.15 kg/m   Visual Acuity Right  Eye Distance:   Left Eye Distance:   Bilateral Distance:    Right Eye Near:   Left Eye Near:    Bilateral Near:     Physical Exam  Constitutional: He is oriented to person, place, and time. He appears well-developed and well-nourished. No distress.  HENT:  Head: Normocephalic and atraumatic.  Right Ear: Tympanic membrane, external ear and ear canal normal.  Left Ear: Tympanic membrane, external ear and ear canal normal.  Nose: Nose normal.  Mouth/Throat: Uvula is midline, oropharynx is clear and moist and mucous membranes are normal. No oropharyngeal exudate or tonsillar abscesses.  Eyes: Pupils are equal, round, and reactive to light. Conjunctivae and EOM are normal. Right eye exhibits no discharge. Left eye exhibits no discharge. No scleral icterus.  Neck: Normal range of motion. Neck supple. No tracheal deviation present. No thyromegaly present.  Cardiovascular: Normal rate, regular rhythm and normal heart sounds.  Pulmonary/Chest: Effort normal and breath sounds normal. No stridor. No respiratory distress. He has no wheezes. He has no rales. He exhibits no tenderness.  Lymphadenopathy:    He has no cervical adenopathy.  Neurological: He is alert and oriented to person, place, and time. He displays normal reflexes. No cranial nerve deficit or sensory deficit. He exhibits normal muscle tone. Coordination normal.  Skin: Skin is warm and dry. No rash noted. He is not diaphoretic.  Nursing note and vitals reviewed.    UC Treatments / Results  Labs (all labs ordered are listed, but only abnormal results are displayed) Labs  Reviewed - No data to display  EKG None  Radiology No results found.  Procedures Procedures (including critical care time)  Medications Ordered in UC Medications - No data to display  Initial Impression / Assessment and Plan / UC Course  I have reviewed the triage vital signs and the nursing notes.  Pertinent labs & imaging results that were available during my care of the patient were reviewed by me and considered in my medical decision making (see chart for details).     Final Clinical Impressions(s) / UC Diagnoses   Final diagnoses:  Tension headache  Hypertension, unspecified type     Discharge Instructions     Increase lisinopril to 10mg  daily Follow up with primary care provider    ED Prescriptions    Medication Sig Dispense Auth. Provider   cyclobenzaprine (FLEXERIL) 10 MG tablet Take 1 tablet (10 mg total) by mouth at bedtime. 30 tablet Devine Klingel, Linward Foster, MD   traMADol (ULTRAM) 50 MG tablet 1-2 tabs po q 8 hours prn 10 tablet Lenya Sterne, Linward Foster, MD      1. diagnosis reviewed with patient 2. rx as per orders above; reviewed possible side effects, interactions, risks and benefits  3. Increase lisinopril to 10mg  po qd 4. Follow up with PCP 5. Follow-up prn if symptoms worsen or don't improve Controlled Substance Prescriptions [place] Controlled Substance Registry consulted? Not Applicable   Norval Gable, MD 11/10/17 1419

## 2017-11-10 NOTE — ED Triage Notes (Signed)
Patient complains of headache that has been constant x 5 days. Reports that he has taken tylenol without relief.

## 2018-03-16 IMAGING — CT CT RENAL STONE PROTOCOL
2 of 4 series · 16 of 46 positions shown, 18 images · non-contrast
Comparison: Abdominal MRI 09/21/2016

CLINICAL DATA: Right upper quadrant pain for 3 weeks with
microhematuria. History of kidney stones.

EXAM:
CT ABDOMEN AND PELVIS WITHOUT CONTRAST
TECHNIQUE: Multidetector CT imaging of the abdomen and pelvis was performed
following the standard protocol without IV contrast.

[Series 2: soft tissue · axial · 0.78mm/px · z∈[-974,-524]mm · 13 of 100 slices shown, 15 images]
[im 5/100  soft-tissue]
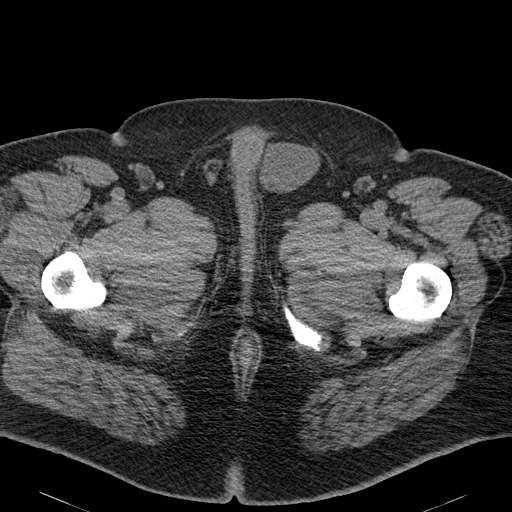
[im 5/100  bone]
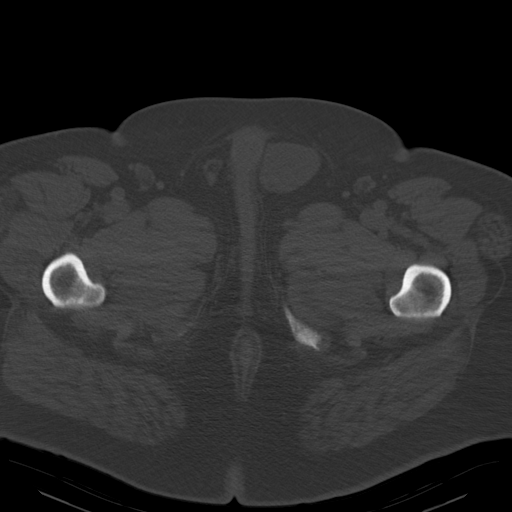
[im 13/100  soft-tissue]
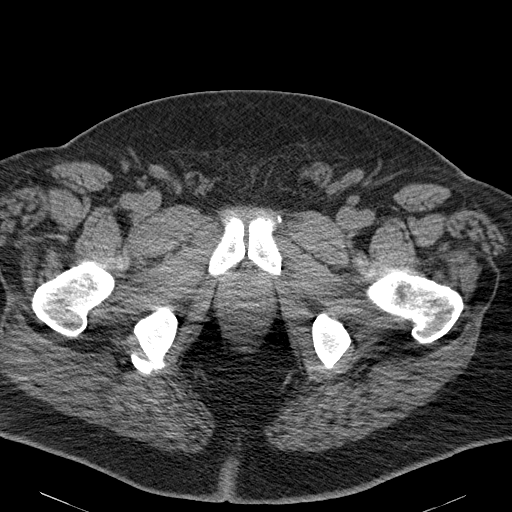
[im 21/100  soft-tissue]
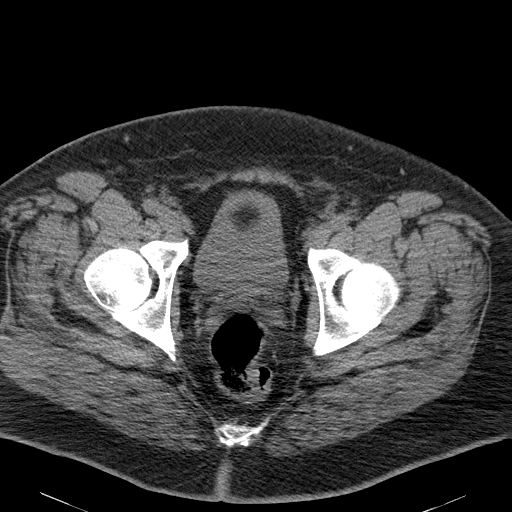
[im 29/100  soft-tissue]
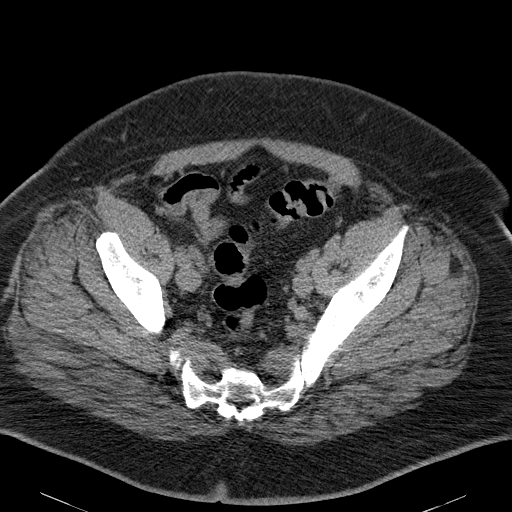
[im 34/100  soft-tissue]
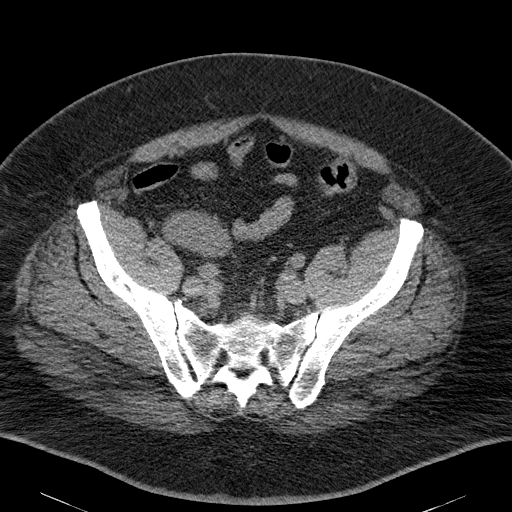
[im 42/100  soft-tissue]
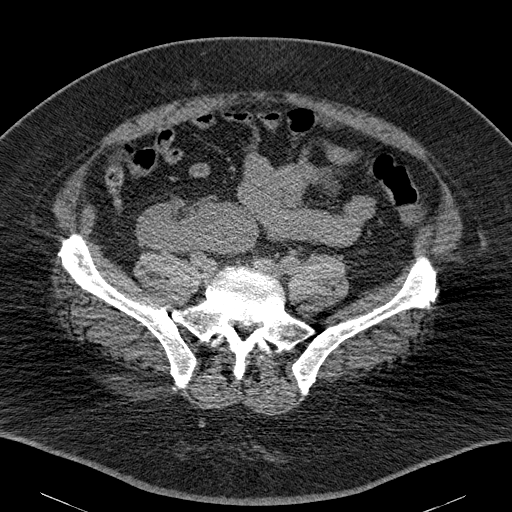
[im 50/100  soft-tissue]
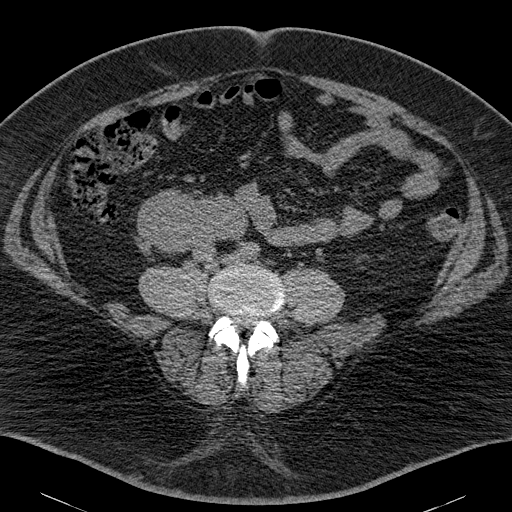
[im 58/100  soft-tissue]
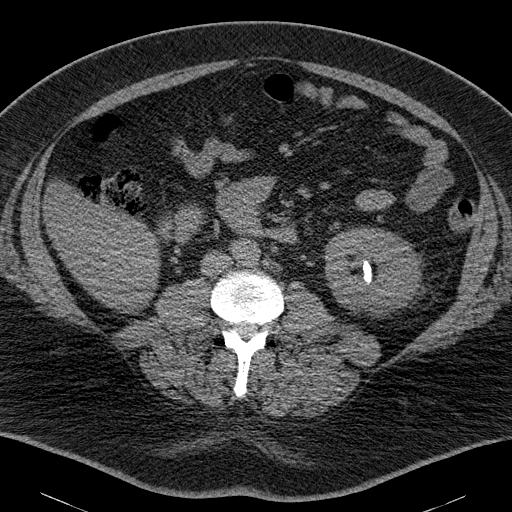
[im 67/100  soft-tissue]
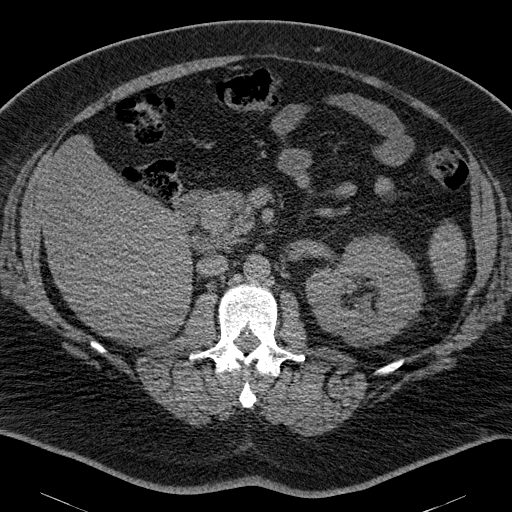
[im 67/100  bone]
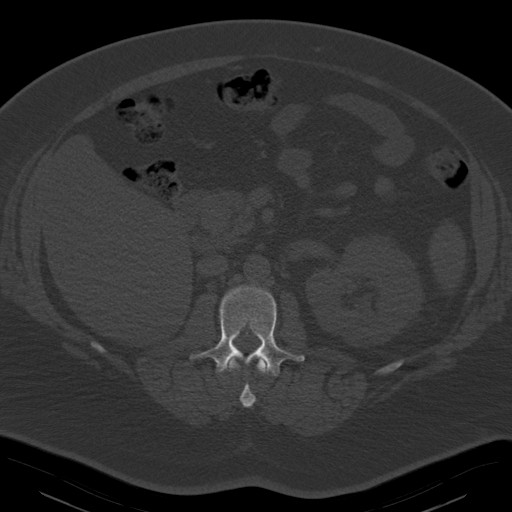
[im 71/100  soft-tissue]
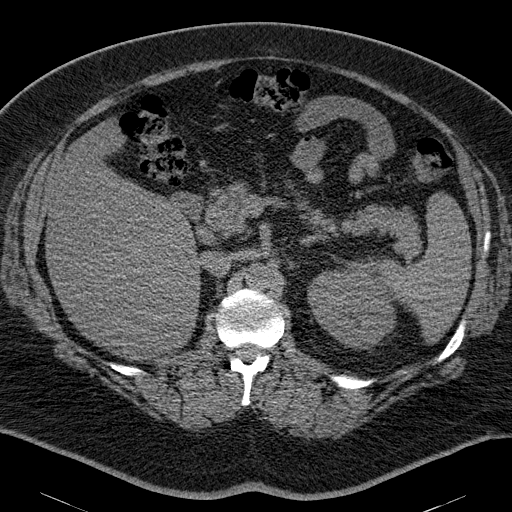
[im 79/100  soft-tissue]
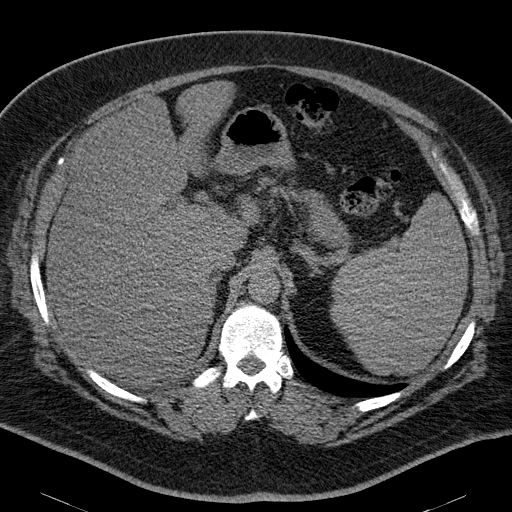
[im 87/100  soft-tissue]
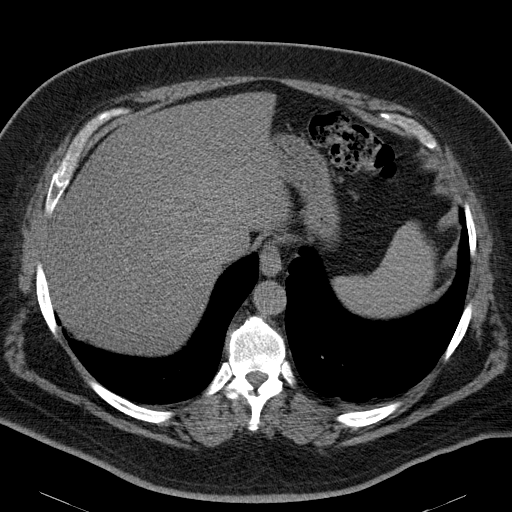
[im 95/100  soft-tissue]
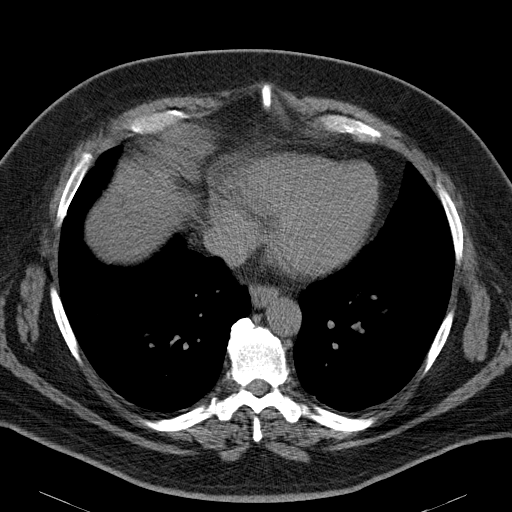

[Series 602: coronal · coronal · 0.99mm/px · 3 of 157 slices shown]
[im 53/157  soft-tissue]
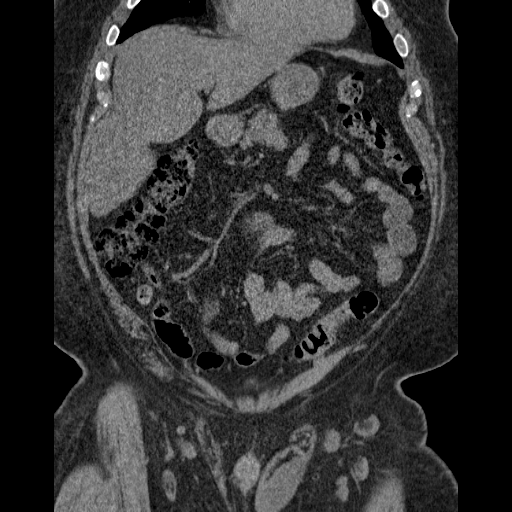
[im 70/157  soft-tissue]
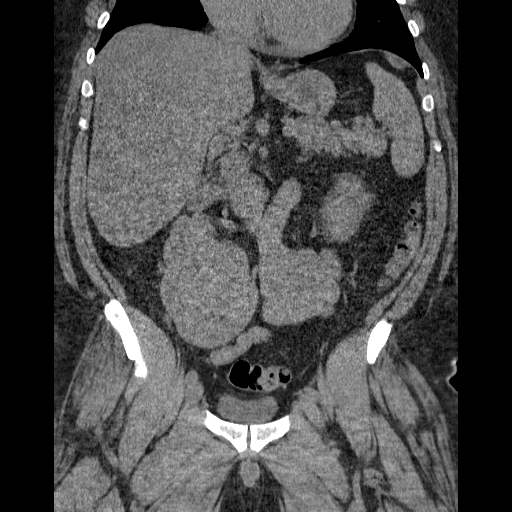
[im 87/157  soft-tissue]
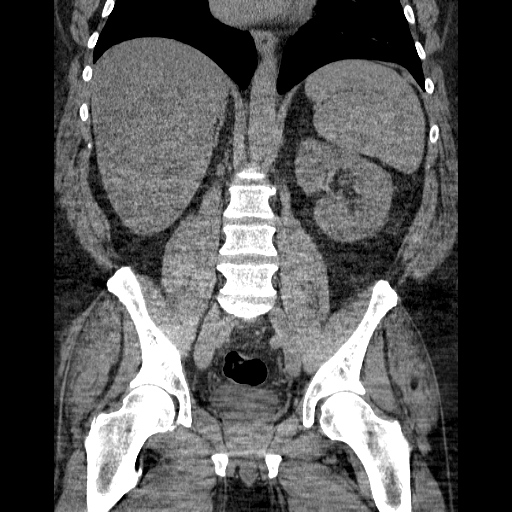

[16 of 46 positions shown; findings below may reference images not displayed]

FINDINGS: Lower chest:  Unremarkable.

Hepatobiliary: The liver shows diffusely decreased attenuation
suggesting steatosis. Gallbladder surgically absent. No intrahepatic
or extrahepatic biliary dilation.

Pancreas: No focal mass lesion. No dilatation of the main duct. No
intraparenchymal cyst. No peripancreatic edema.

Spleen: No splenomegaly. No focal mass lesion.

Adrenals/Urinary Tract: No adrenal nodule or mass. 7 x 17 x 14 mm
nonobstructing stone identified lower pole left kidney. No left
ureteral stone. No left hydroureteronephrosis.

Malrotated, mouth positioned right kidney demonstrates a 4 mm
interpolar stone. No right ureteral stone. No right
hydroureteronephrosis.

No bladder stone.

Stomach/Bowel: Stomach is nondistended. No gastric wall thickening.
No evidence of outlet obstruction. Duodenum is normally positioned
as is the ligament of Treitz. No small bowel wall thickening. No
small bowel dilatation. The terminal ileum is normal. The appendix
is normal. No gross colonic mass. No colonic wall thickening. No
substantial diverticular change.

Vascular/Lymphatic: No abdominal aortic aneurysm. There is no
gastrohepatic or hepatoduodenal ligament lymphadenopathy. No
intraperitoneal or retroperitoneal lymphadenopathy. No pelvic
sidewall lymphadenopathy.

Reproductive: The prostate gland and seminal vesicles have normal
imaging features. Left testicle visualized in the lower inguinal
canal in this may be related to marked retraction or incomplete
descent.

Other: No intraperitoneal free fluid.

Musculoskeletal: Bone windows reveal no worrisome lytic or sclerotic
osseous lesions.
IMPRESSION: 1. Bilateral nonobstructing renal stones. No secondary changes in
either kidney or ureter.
2. Left testicle is identified in the lower left inguinal canal.
This may be related to testicular retraction although incomplete
descent could have this appearance.

## 2018-06-12 ENCOUNTER — Ambulatory Visit
Admission: EM | Admit: 2018-06-12 | Discharge: 2018-06-12 | Disposition: A | Discharge status: Home / Self Care | Payer: 59 | Attending: Family Medicine | Admitting: Family Medicine

## 2018-06-12 ENCOUNTER — Encounter: Payer: Self-pay | Admitting: Emergency Medicine

## 2018-06-12 ENCOUNTER — Other Ambulatory Visit: Payer: Self-pay

## 2018-06-12 DIAGNOSIS — R319 Hematuria, unspecified: Secondary | ICD-10-CM

## 2018-06-12 DIAGNOSIS — B3741 Candidal cystitis and urethritis: Secondary | ICD-10-CM | POA: Diagnosis not present

## 2018-06-12 LAB — URINALYSIS, COMPLETE (UACMP) WITH MICROSCOPIC
Bacteria, UA: NONE SEEN
Glucose, UA: NEGATIVE mg/dL
Leukocytes,Ua: NEGATIVE
Nitrite: NEGATIVE
Protein, ur: 100 mg/dL — AB
RBC / HPF: >50 RBC/hpf (ref 0–5)
Specific Gravity, Urine: >1.03 — ABNORMAL HIGH (ref 1.005–1.030)
Squamous Epithelial / LPF: NONE SEEN (ref 0–5)
WBC, UA: NONE SEEN WBC/hpf (ref 0–5)
pH: 5.5 (ref 5.0–8.0)

## 2018-06-12 MED ORDER — TAMSULOSIN HCL 0.4 MG PO CAPS: 0.4000 mg | ORAL_CAPSULE | Freq: Every day | ORAL | 0 refills | Status: DC

## 2018-06-12 MED ORDER — HYDROCODONE-ACETAMINOPHEN 5-325 MG PO TABS: ORAL_TABLET | ORAL | 0 refills | Status: DC

## 2018-06-12 MED ORDER — FLUCONAZOLE 150 MG PO TABS: 150.0000 mg | ORAL_TABLET | Freq: Every day | ORAL | 0 refills | Status: DC

## 2018-06-12 NOTE — ED Provider Notes (Signed)
[facility]    CSN: [id number] Arrival date & time: 06/12/18  1609     History   Chief Complaint Chief Complaint  Patient presents with  . Hematuria    APPT    HPI Jacob [name] is a 48 y.o. male.   48 yo male with a c/o blood in the urine and mild discomfort with urination and in the left groin area since this morning. States he has a prior history of episodes of kidney stones and current symptoms are similar. Denies any fevers, chills, vomiting.   The history is provided by the patient.  Hematuria     Past Medical History:  Diagnosis Date  . Anxiety   . Depression   . Diabetes ([place])   . Gout   . Heart murmur   . Hypertension   . Obesity   . Pituitary adenoma ([place])   . Sleep apnea    uses cpap    Patient Active Problem List   Diagnosis Date Noted  . Non-pressure chronic ulcer of left ankle, limited to breakdown of skin ([place]) 04/25/2015  . HYPERTENSION 02/22/2008  . SLEEP APNEA 02/22/2008  . PITUITARY NEOPLASM, HX OF 02/22/2008    Past Surgical History:  Procedure Laterality Date  . CHOLECYSTECTOMY    . KNEE ARTHROSCOPY Right 08/23/2015   Procedure: ARTHROSCOPY KNEE, partial medial and lateral menisectomy, medial and lateral chondolar microfracture.;  Surgeon: Leanor Kail, MD;  Location: [facility] OR[facility];  Service: Orthopedics;  Laterality: Right;  . KNEE ARTHROSCOPY Left 02/21/2016   Procedure: ARTHROSCOPY KNEE;  Surgeon: Leanor Kail, MD;  Location: [facility];  Service: Orthopedics;  Laterality: Left;  . TONSILLECTOMY    . TONSILLECTOMY AND ADENOIDECTOMY    . UVULOPALATOPHARYNGOPLASTY    . WISDOM TOOTH EXTRACTION         Home Medications    Prior to Admission medications   Medication Sig Start Date End Date Taking? Authorizing Provider  allopurinol (ZYLOPRIM) 100 MG tablet Take 100 mg by mouth at bedtime.    Yes [provider]  aspirin EC 81 MG tablet Take 81 mg by mouth daily.   Yes [provider]  atorvastatin  (LIPITOR) 20 MG tablet  06/18/16  Yes [provider]  FLUoxetine (PROZAC) 20 MG capsule Take 20 mg by mouth at bedtime.    Yes [provider]  fluticasone (FLONASE) 50 MCG/ACT nasal spray Place into both nostrils daily.   Yes [provider]  lisinopril (PRINIVIL,ZESTRIL) 5 MG tablet Take 5 mg by mouth at bedtime.    Yes [provider]  metFORMIN (GLUMETZA) 500 MG (MOD) 24 hr tablet Take 1,500 mg at bedtime by mouth.    Yes [provider]  colchicine 0.6 MG tablet Take 0.6 mg by mouth at bedtime.     [provider]  cyclobenzaprine (FLEXERIL) 10 MG tablet Take 1 tablet (10 mg total) by mouth at bedtime. 11/10/17   Norval Gable, MD  fluconazole (DIFLUCAN) 150 MG tablet Take 1 tablet (150 mg total) by mouth daily. Repeat one dose in 1 week 06/12/18   Norval Gable, MD  fluocinonide (LIDEX) 0.05 % external solution Apply 1 application topically as needed.    [provider]  glucose blood (ONE TOUCH ULTRA TEST) test strip  12/06/15   [provider]  HYDROcodone-acetaminophen (NORCO/VICODIN) 5-325 MG tablet 1-2 tabs po q 8 hours prn 06/12/18   Norval Gable, MD  ketoconazole (NIZORAL) 2 % cream Apply 1 application topically 2 (two) times  daily as needed for irritation.    [provider]  mupirocin ointment (BACTROBAN) 2 % Apply 1 application topically 3 (three) times daily. 08/06/16   Lorin Picket, PA-C  [place]  12/06/15   [provider]  tamsulosin (FLOMAX) 0.4 MG CAPS capsule Take 1 capsule (0.4 mg total) by mouth daily. 06/12/18   Norval Gable, MD  traMADol Veatrice Bourbon) 50 MG tablet 1-2 tabs po q 8 hours prn 11/10/17   Norval Gable, MD    Family History Family History  Problem Relation Age of Onset  . Lung cancer Father   . Thyroid disease Mother   . Thyroid disease Sister     Social History Social History   Tobacco Use  . Smoking status: Former Smoker    Packs/day:  1.00    Years: 10.00    Pack years: 10.00    Types: Cigarettes    Last attempt to quit: 04/29/1993    Years since quitting: 25.1  . Smokeless tobacco: Former Systems developer    Types: Chew    Quit date: 05/12/2015  Substance Use Topics  . Alcohol use: Yes    Alcohol/week: 0.0 standard drinks    Comment: rarely  . Drug use: No     Allergies   Hydrocortisone   Review of Systems Review of Systems  Genitourinary: Positive for hematuria.     Physical Exam Triage Vital Signs ED Triage Vitals  Enc Vitals Group     BP 06/12/18 1623 (!) 161/88     Pulse Rate 06/12/18 1623 92     Resp 06/12/18 1623 16     Temp 06/12/18 1623 98 F (36.7 C)     Temp Source 06/12/18 1623 Oral     SpO2 06/12/18 1623 97 %     Weight 06/12/18 1620 (!) 345 lb (156.5 kg)     Height 06/12/18 1620 6' (1.829 m)     Head Circumference --      Peak Flow --      Pain Score 06/12/18 1620 0     Pain Loc --      Pain Edu? --      Excl. in [place]? --    No data found.  Updated Vital Signs BP (!) 161/88 (BP Location: Left Arm)   Pulse 92   Temp 98 F (36.7 C) (Oral)   Resp 16   Ht 6' (1.829 m)   Wt (!) 156.5 kg   SpO2 97%   BMI 46.79 kg/m   Visual Acuity Right Eye Distance:   Left Eye Distance:   Bilateral Distance:    Right Eye Near:   Left Eye Near:    Bilateral Near:     Physical Exam Vitals signs reviewed.  Constitutional:      General: He is not in acute distress.    Appearance: He is not toxic-appearing or diaphoretic.  Abdominal:     General: There is no distension.     Palpations: Abdomen is soft.     Tenderness: There is no right CVA tenderness or left CVA tenderness.     Comments: obese  Neurological:     Mental Status: He is alert.      UC Treatments / Results  Labs (all labs ordered are listed, but only abnormal results are displayed) Labs Reviewed  URINALYSIS, COMPLETE (UACMP) WITH MICROSCOPIC - Abnormal; Notable for the following components:      Result Value   Color, Urine  AMBER (*)  APPearance CLOUDY (*)    Specific Gravity, Urine >1.030 (*)    Hgb urine dipstick LARGE (*)    Bilirubin Urine SMALL (*)    Ketones, ur TRACE (*)    Protein, ur 100 (*)    All other components within normal limits  URINE CULTURE    EKG None  Radiology No results found.  Procedures Procedures (including critical care time)  Medications Ordered in UC Medications - No data to display  Initial Impression / Assessment and Plan / UC Course  I have reviewed the triage vital signs and the nursing notes.  Pertinent labs & imaging results that were available during my care of the patient were reviewed by me and considered in my medical decision making (see chart for details).      Final Clinical Impressions(s) / UC Diagnoses   Final diagnoses:  Hematuria, unspecified type  Yeast cystitis    ED Prescriptions    Medication Sig Dispense Auth. Provider   fluconazole (DIFLUCAN) 150 MG tablet Take 1 tablet (150 mg total) by mouth daily. Repeat one dose in 1 week 2 tablet Norval Gable, MD   tamsulosin (FLOMAX) 0.4 MG CAPS capsule Take 1 capsule (0.4 mg total) by mouth daily. 30 capsule Norval Gable, MD   HYDROcodone-acetaminophen (NORCO/VICODIN) 5-325 MG tablet 1-2 tabs po q 8 hours prn 8 tablet Norval Gable, MD     1. Lab results and diagnosis reviewed with patient 2. rx as per orders above; reviewed possible side effects, interactions, risks and benefits  3. Recommend supportive treatment with increase water intake, otc analgesics prn 4. Follow-up prn if symptoms worsen or don't improve     Controlled Substance Prescriptions [place] Controlled Substance Registry consulted? Not Applicable   Norval Gable, MD 06/12/18 [phone]

## 2018-06-12 NOTE — ED Triage Notes (Signed)
Patient c/o blood in his urine that started this morning.  Patient denies any pain.

## 2018-06-14 LAB — URINE CULTURE
CULTURE: NO GROWTH
Special Requests: NORMAL

## 2019-06-13 ENCOUNTER — Other Ambulatory Visit: Payer: Self-pay

## 2019-06-13 ENCOUNTER — Ambulatory Visit
Admission: EM | Admit: 2019-06-13 | Discharge: 2019-06-13 | Disposition: A | Discharge status: Home / Self Care | Payer: 59 | Attending: Family Medicine | Admitting: Family Medicine

## 2019-06-13 DIAGNOSIS — M5442 Lumbago with sciatica, left side: Secondary | ICD-10-CM

## 2019-06-13 MED ORDER — TIZANIDINE HCL 4 MG PO TABS: 4.0000 mg | ORAL_TABLET | Freq: Three times a day (TID) | ORAL | 0 refills | Status: DC | PRN

## 2019-06-13 MED ORDER — PREDNISONE 10 MG PO TABS: ORAL_TABLET | ORAL | 0 refills | Status: DC

## 2019-06-13 NOTE — Discharge Instructions (Signed)
Rest.  Ice and heat.   Medication as prescribed.  Take care  Dr. Lacinda Axon

## 2019-06-13 NOTE — ED Provider Notes (Signed)
[facility]    CSN: [id number] Arrival date & time: 06/13/19  1036      History   Chief Complaint Chief Complaint  Patient presents with  . Back Pain   HPI  49 year old male presents with low back pain.  Patient reports that he injured his low back on Thursday as he was taking a 10 foot ladder off of his truck.  He reports left-sided low back pain.  Sharp.  Severe.  Worse with activity/range of motion.  9/10 in severity.  No relieving factors.  He reports radiation down his left leg.  No saddle anesthesia or incontinence.  No other associated symptoms.  No other complaints.  Past Medical History:  Diagnosis Date  . Anxiety   . Depression   . Diabetes ([place])   . Gout   . Heart murmur   . Hypertension   . Obesity   . Pituitary adenoma ([place])   . Sleep apnea    uses cpap    Patient Active Problem List   Diagnosis Date Noted  . Non-pressure chronic ulcer of left ankle, limited to breakdown of skin ([place]) 04/25/2015  . HYPERTENSION 02/22/2008  . SLEEP APNEA 02/22/2008  . PITUITARY NEOPLASM, HX OF 02/22/2008    Past Surgical History:  Procedure Laterality Date  . CHOLECYSTECTOMY    . KNEE ARTHROSCOPY Right 08/23/2015   Procedure: ARTHROSCOPY KNEE, partial medial and lateral menisectomy, medial and lateral chondolar microfracture.;  Surgeon: Leanor Kail, MD;  Location: [facility] ORS;  Service: Orthopedics;  Laterality: Right;  . KNEE ARTHROSCOPY Left 02/21/2016   Procedure: ARTHROSCOPY KNEE;  Surgeon: Leanor Kail, MD;  Location: [facility] OR[facility];  Service: Orthopedics;  Laterality: Left;  . TONSILLECTOMY    . TONSILLECTOMY AND ADENOIDECTOMY    . UVULOPALATOPHARYNGOPLASTY    . WISDOM TOOTH EXTRACTION      Home Medications    Prior to Admission medications   Medication Sig Start Date End Date Taking? Authorizing Provider  allopurinol (ZYLOPRIM) 100 MG tablet Take 100 mg by mouth at bedtime.    Yes [provider]  aspirin EC 81 MG tablet Take 81 mg by  mouth daily.   Yes [provider]  atorvastatin (LIPITOR) 20 MG tablet  06/18/16  Yes [provider]  colchicine 0.6 MG tablet Take 0.6 mg by mouth at bedtime.    Yes [provider]  FLUoxetine (PROZAC) 20 MG capsule Take 20 mg by mouth at bedtime.    Yes [provider]  fluticasone (FLONASE) 50 MCG/ACT nasal spray Place into both nostrils daily.   Yes [provider]  glucose blood (ONE TOUCH ULTRA TEST) test strip  12/06/15  Yes [provider]  ketoconazole (NIZORAL) 2 % cream Apply 1 application topically 2 (two) times daily as needed for irritation.   Yes [provider]  lisinopril (PRINIVIL,ZESTRIL) 5 MG tablet Take 5 mg by mouth at bedtime.    Yes [provider]  metFORMIN (GLUMETZA) 500 MG (MOD) 24 hr tablet Take 1,500 mg at bedtime by mouth.    Yes [provider]  [place]  12/06/15  Yes [provider]  tamsulosin (FLOMAX) 0.4 MG CAPS capsule Take 1 capsule (0.4 mg total) by mouth daily. 06/12/18  Yes Norval Gable, MD  predniSONE (DELTASONE) 10 MG tablet 50 mg daily x 2 days, then 40 mg daily x 2 days, then 30 mg daily x 2 days, then 20 mg daily x 2 days, then 10 mg daily  x 2 days. 06/13/19   Coral Spikes, DO  tiZANidine (ZANAFLEX) 4 MG tablet Take 1 tablet (4 mg total) by mouth every 8 (eight) hours as needed for muscle spasms. 06/13/19   Coral Spikes, DO    Family History Family History  Problem Relation Age of Onset  . Lung cancer Father   . Thyroid disease Mother   . Thyroid disease Sister     Social History Social History   Tobacco Use  . Smoking status: Former Smoker    Packs/day: 1.00    Years: 10.00    Pack years: 10.00    Types: Cigarettes    Quit date: 04/29/1993    Years since quitting: 26.1  . Smokeless tobacco: Former Systems developer    Types: Chew    Quit date: 05/12/2015  Substance Use Topics  . Alcohol use: Yes    Alcohol/week: 0.0 standard drinks     Comment: rarely  . Drug use: No     Allergies   Patient has no active allergies.   Review of Systems Review of Systems  Constitutional: Negative.   Musculoskeletal: Positive for back pain.   Physical Exam Triage Vital Signs ED Triage Vitals  Enc Vitals Group     BP 06/13/19 1050 (!) 185/97     Pulse Rate 06/13/19 1050 65     Resp --      Temp 06/13/19 1050 98.7 F (37.1 C)     Temp Source 06/13/19 1050 Oral     SpO2 06/13/19 1050 99 %     Weight 06/13/19 1046 (!) 360 lb (163.3 kg)     Height 06/13/19 1046 6' (1.829 m)     Head Circumference --      Peak Flow --      Pain Score 06/13/19 1046 9     Pain Loc --      Pain Edu? --      Excl. in [place]? --    Updated Vital Signs BP (!) 185/97 (BP Location: Left Arm)   Pulse 65   Temp 98.7 F (37.1 C) (Oral)   Ht 6' (1.829 m)   Wt (!) 163.3 kg   SpO2 99%   BMI 48.82 kg/m   Visual Acuity Right Eye Distance:   Left Eye Distance:   Bilateral Distance:    Right Eye Near:   Left Eye Near:    Bilateral Near:     Physical Exam Vitals and nursing note reviewed.  Constitutional:      General: He is not in acute distress.    Appearance: Normal appearance. He is obese. He is not ill-appearing.  HENT:     Head: Normocephalic and atraumatic.  Eyes:     General:        Right eye: No discharge.        Left eye: No discharge.     Conjunctiva/sclera: Conjunctivae normal.  Cardiovascular:     Rate and Rhythm: Normal rate and regular rhythm.     Heart sounds: No murmur.  Pulmonary:     Effort: Pulmonary effort is normal.     Breath sounds: Normal breath sounds. No wheezing, rhonchi or rales.  Musculoskeletal:     Comments: Left lumbar spine, paraspinal musculature tender to palpation.  Neurological:     Mental Status: He is alert.  Psychiatric:        Mood and Affect: Mood normal.        Behavior: Behavior normal.    UC Treatments /  Results  Labs (all labs ordered are listed, but only abnormal results are  displayed) Labs Reviewed - No data to display  EKG   Radiology No results found.  Procedures Procedures (including critical care time)  Medications Ordered in UC Medications - No data to display  Initial Impression / Assessment and Plan / UC Course  I have reviewed the triage vital signs and the nursing notes.  Pertinent labs & imaging results that were available during my care of the patient were reviewed by me and considered in my medical decision making (see chart for details).    49 year old male presents with acute low back pain.  Placing on prednisone and Zanaflex.  Final Clinical Impressions(s) / UC Diagnoses   Final diagnoses:  Acute left-sided low back pain with left-sided sciatica     Discharge Instructions     Rest.  Ice and heat.   Medication as prescribed.  Take care  Dr. Lacinda Axon    ED Prescriptions    Medication Sig Dispense Auth. Provider   predniSONE (DELTASONE) 10 MG tablet 50 mg daily x 2 days, then 40 mg daily x 2 days, then 30 mg daily x 2 days, then 20 mg daily x 2 days, then 10 mg daily x 2 days. 30 tablet Kamry Faraci G, DO   tiZANidine (ZANAFLEX) 4 MG tablet Take 1 tablet (4 mg total) by mouth every 8 (eight) hours as needed for muscle spasms. 30 tablet Coral Spikes, DO     PDMP not reviewed this encounter.   Coral Spikes, DO 06/13/19 1155

## 2019-06-13 NOTE — ED Triage Notes (Signed)
Pt presents with c/o LBP since Thursday when he took a ladder down off a truck, he felt a sharp pain. He has been in pain since. He reports sharp pain on the left lower back that is shooting down his left leg. He denies any known back problems.

## 2019-07-23 ENCOUNTER — Other Ambulatory Visit: Payer: Self-pay

## 2019-07-23 ENCOUNTER — Ambulatory Visit
Admission: EM | Admit: 2019-07-23 | Discharge: 2019-07-23 | Disposition: A | Discharge status: Home / Self Care | Payer: 59 | Attending: Urgent Care | Admitting: Urgent Care

## 2019-07-23 DIAGNOSIS — T300 Burn of unspecified body region, unspecified degree: Secondary | ICD-10-CM

## 2019-07-23 MED ORDER — SILVER SULFADIAZINE 1 % EX CREA: 1.0000 "application " | TOPICAL_CREAM | Freq: Every day | CUTANEOUS | 0 refills | Status: DC

## 2019-07-23 MED ORDER — DOXYCYCLINE HYCLATE 100 MG PO CAPS
100.0000 mg | ORAL_CAPSULE | Freq: Two times a day (BID) | ORAL | 0 refills | Status: AC
Start: 2019-07-23 — End: 2019-07-30

## 2019-07-23 NOTE — Discharge Instructions (Addendum)
It was very nice seeing you today in clinic. Thank you for entrusting me with your care.   Keep wounds clean and dry. Use topical and oral antibiotics as discussed. Monitor for signs and symptoms of infection, which would include increased redness, swelling, streaking, drainage, pain, and the development of a fever. May use Tylenol and/or Ibuprofen as needed for pain/fever.   Make arrangements to follow up with your regular doctor in 1 week for re-evaluation if not improving.  If your symptoms/condition worsens, please seek follow up care either here or in the ER. Please remember, our [facility] providers are "right here with you" when you need [place].   Again, it was my pleasure to take care of you today. Thank you for choosing our clinic. I hope that you start to feel better quickly.   Honor Loh, MSN, APRN, FNP-C, CEN Advanced Practice Provider [facility]
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
## 2019-08-24 ENCOUNTER — Encounter: Payer: 59 | Admitting: Dermatology

## 2019-10-04 ENCOUNTER — Encounter: Payer: Self-pay | Admitting: Emergency Medicine

## 2019-10-04 ENCOUNTER — Other Ambulatory Visit: Payer: Self-pay

## 2019-10-04 ENCOUNTER — Ambulatory Visit
Admission: EM | Admit: 2019-10-04 | Discharge: 2019-10-04 | Disposition: A | Discharge status: Home / Self Care | Payer: 59 | Attending: Family Medicine | Admitting: Family Medicine

## 2019-10-04 DIAGNOSIS — R079 Chest pain, unspecified: Secondary | ICD-10-CM | POA: Diagnosis not present

## 2019-10-04 MED ORDER — ASPIRIN 81 MG PO CHEW
324.0000 mg | CHEWABLE_TABLET | Freq: Once | ORAL | Status: AC
  Administered 2019-10-04: 324 mg via ORAL

## 2019-10-04 NOTE — ED Triage Notes (Signed)
Pt c/o chest tightness, sweats, palpitations, blurred vision. Started last night. He states the palpitations have been going on for about 3 days now. Denies shortness of breath.

## 2019-10-04 NOTE — Discharge Instructions (Signed)
Aspirin given.  Go straight to the ER.  Take care  Dr. Lacinda Axon

## 2019-10-04 NOTE — ED Provider Notes (Signed)
[facility]    CSN: [id number] Arrival date & time: 10/04/19  1001  History   Chief Complaint Chief Complaint  Patient presents with  . Chest Pain   HPI  49 year old male presents with chest pain.  Patient reports that he developed chest tightness last night.  He states that he woke up in a sweat and had left-sided chest pain which she describes as a tightness.  He reports some radiation upward.  Patient reports he has had ongoing palpitations as well.  None currently.  Denies shortness of breath.  No association with exertion.  He notes some blurry vision as well.  Pain currently 5/10 in severity.  No relieving factors.  No other associated symptoms.  Past Medical History:  Diagnosis Date  . Anxiety   . Depression   . Diabetes ([place])   . Gout   . Heart murmur   . Hypertension   . Obesity   . Pituitary adenoma ([place])   . Sleep apnea    uses cpap    Patient Active Problem List   Diagnosis Date Noted  . Non-pressure chronic ulcer of left ankle, limited to breakdown of skin ([place]) 04/25/2015  . HYPERTENSION 02/22/2008  . SLEEP APNEA 02/22/2008  . PITUITARY NEOPLASM, HX OF 02/22/2008    Past Surgical History:  Procedure Laterality Date  . CHOLECYSTECTOMY    . KNEE ARTHROSCOPY Right 08/23/2015   Procedure: ARTHROSCOPY KNEE, partial medial and lateral menisectomy, medial and lateral chondolar microfracture.;  Surgeon: Leanor Kail, MD;  Location: [facility] ORS;  Service: Orthopedics;  Laterality: Right;  . KNEE ARTHROSCOPY Left 02/21/2016   Procedure: ARTHROSCOPY KNEE;  Surgeon: Leanor Kail, MD;  Location: [facility] ORS;  Service: Orthopedics;  Laterality: Left;  . TONSILLECTOMY    . TONSILLECTOMY AND ADENOIDECTOMY    . UVULOPALATOPHARYNGOPLASTY    . WISDOM TOOTH EXTRACTION         Home Medications    Prior to Admission medications   Medication Sig Start Date End Date Taking? Authorizing Provider  allopurinol (ZYLOPRIM) 100 MG tablet Take 100 mg by mouth at  bedtime.    Yes [provider]  aspirin EC 81 MG tablet Take 81 mg by mouth daily.   Yes [provider]  atorvastatin (LIPITOR) 20 MG tablet  06/18/16  Yes [provider]  colchicine 0.6 MG tablet Take 0.6 mg by mouth at bedtime.    Yes [provider]  FLUoxetine (PROZAC) 20 MG capsule Take 20 mg by mouth at bedtime.    Yes [provider]  fluticasone (FLONASE) 50 MCG/ACT nasal spray Place into both nostrils daily.   Yes [provider]  glucose blood (ONE TOUCH ULTRA TEST) test strip  12/06/15  Yes [provider]  lisinopril (PRINIVIL,ZESTRIL) 5 MG tablet Take 5 mg by mouth at bedtime.    Yes [provider]  metFORMIN (GLUMETZA) 500 MG (MOD) 24 hr tablet Take 1,500 mg at bedtime by mouth.    Yes [provider]  [place]  12/06/15  Yes [provider]  ketoconazole (NIZORAL) 2 % cream Apply 1 application topically 2 (two) times daily as needed for irritation.    [provider]  predniSONE (DELTASONE) 10 MG tablet 50 mg daily x 2 days, then 40 mg daily x 2 days, then 30 mg daily x 2 days, then 20 mg daily x 2 days, then 10 mg daily x 2 days. 06/13/19   Coral Spikes, DO  silver  sulfADIAZINE (SILVADENE) 1 % cream Apply 1 application topically daily. 07/23/19   Karen Kitchens, NP  tamsulosin (FLOMAX) 0.4 MG CAPS capsule Take 1 capsule (0.4 mg total) by mouth daily. 06/12/18   Norval Gable, MD  tiZANidine (ZANAFLEX) 4 MG tablet Take 1 tablet (4 mg total) by mouth every 8 (eight) hours as needed for muscle spasms. 06/13/19   Coral Spikes, DO    Family History Family History  Problem Relation Age of Onset  . Lung cancer Father   . Thyroid disease Mother   . Thyroid disease Sister     Social History Social History   Tobacco Use  . Smoking status: Former Smoker    Packs/day: 1.00    Years: 10.00    Pack years: 10.00    Types: Cigarettes    Quit date: 04/29/1993     Years since quitting: 26.4  . Smokeless tobacco: Former Systems developer    Types: Chew    Quit date: 05/12/2015  Substance Use Topics  . Alcohol use: Yes    Alcohol/week: 0.0 standard drinks    Comment: rarely  . Drug use: No     Allergies   Patient has no known allergies.   Review of Systems Review of Systems  Constitutional: Positive for diaphoresis.  Eyes: Positive for visual disturbance.  Cardiovascular: Positive for chest pain and palpitations.   Physical Exam Triage Vital Signs ED Triage Vitals  Enc Vitals Group     BP 10/04/19 1015 (!) 169/89     Pulse Rate 10/04/19 1015 67     Resp 10/04/19 1015 18     Temp 10/04/19 1015 98.5 F (36.9 C)     Temp Source 10/04/19 1015 Oral     SpO2 10/04/19 1015 98 %     Weight 10/04/19 1012 (!) 355 lb (161 kg)     Height 10/04/19 1012 6' (1.829 m)     Head Circumference --      Peak Flow --      Pain Score 10/04/19 1012 5     Pain Loc --      Pain Edu? --      Excl. in [place]? --    Updated Vital Signs BP (!) 169/89   Pulse 67   Temp 98.5 F (36.9 C) (Oral)   Resp 18   Ht 6' (1.829 m)   Wt (!) 161 kg   SpO2 98%   BMI 48.15 kg/m   Visual Acuity Right Eye Distance:   Left Eye Distance:   Bilateral Distance:    Right Eye Near:   Left Eye Near:    Bilateral Near:     Physical Exam Vitals and nursing note reviewed.  Constitutional:      General: He is not in acute distress.    Appearance: He is well-developed. He is obese. He is not ill-appearing.  HENT:     Head: Normocephalic and atraumatic.  Eyes:     General:        Right eye: No discharge.        Left eye: No discharge.     Conjunctiva/sclera: Conjunctivae normal.  Cardiovascular:     Rate and Rhythm: Normal rate and regular rhythm.     Heart sounds: No murmur.  Pulmonary:     Effort: Pulmonary effort is normal.     Breath sounds: Normal breath sounds. No wheezing, rhonchi or rales.  Abdominal:     General: There is no distension.     Palpations:  Abdomen is  soft.     Tenderness: There is no abdominal tenderness.  Neurological:     Mental Status: He is alert.  Psychiatric:        Mood and Affect: Mood normal.        Behavior: Behavior normal.    UC Treatments / Results  Labs (all labs ordered are listed, but only abnormal results are displayed) Labs Reviewed - No data to display  EKG Interpretation: Normal sinus rhythm with rate of 69.  Normal axis.  Normal intervals.  No ST or T wave changes.  Normal EKG.  Radiology No results found.  Procedures Procedures (including critical care time)  Medications Ordered in UC Medications  aspirin chewable tablet 324 mg (324 mg Oral Given 10/04/19 1052)    Initial Impression / Assessment and Plan / UC Course  I have reviewed the triage vital signs and the nursing notes.  Pertinent labs & imaging results that were available during my care of the patient were reviewed by me and considered in my medical decision making (see chart for details).    49 year old male presents with chest pain.  Normal EKG here.  Patient having persistent chest pain and given risk factors I have advised him to go to the hospital for cardiac evaluation/serial troponins.  Patient is in agreement.  He wants to go via private vehicle.  His wife is taking him to [facility].  Final Clinical Impressions(s) / UC Diagnoses   Final diagnoses:  Chest pain, unspecified type     Discharge Instructions     Aspirin given.  Go straight to the ER.  Take care  Dr. Lacinda Axon    ED Prescriptions    None     PDMP not reviewed this encounter.   Coral Spikes, [place] 10/04/19 1126

## 2019-10-19 DIAGNOSIS — M47816 Spondylosis without myelopathy or radiculopathy, lumbar region: Secondary | ICD-10-CM | POA: Insufficient documentation

## 2019-11-16 ENCOUNTER — Other Ambulatory Visit: Payer: Self-pay

## 2019-11-16 ENCOUNTER — Ambulatory Visit (INDEPENDENT_AMBULATORY_CARE_PROVIDER_SITE_OTHER): Payer: 59 | Admitting: Dermatology

## 2019-11-16 ENCOUNTER — Encounter: Payer: Self-pay | Admitting: Dermatology

## 2019-11-16 DIAGNOSIS — L570 Actinic keratosis: Secondary | ICD-10-CM

## 2019-11-16 DIAGNOSIS — Z1283 Encounter for screening for malignant neoplasm of skin: Secondary | ICD-10-CM

## 2019-11-16 DIAGNOSIS — L905 Scar conditions and fibrosis of skin: Secondary | ICD-10-CM

## 2019-11-16 DIAGNOSIS — D1800 Hemangioma unspecified site: Secondary | ICD-10-CM

## 2019-11-16 DIAGNOSIS — D229 Melanocytic nevi, unspecified: Secondary | ICD-10-CM | POA: Diagnosis not present

## 2019-11-16 DIAGNOSIS — L918 Other hypertrophic disorders of the skin: Secondary | ICD-10-CM

## 2019-11-16 DIAGNOSIS — L219 Seborrheic dermatitis, unspecified: Secondary | ICD-10-CM

## 2019-11-16 DIAGNOSIS — Z86018 Personal history of other benign neoplasm: Secondary | ICD-10-CM

## 2019-11-16 DIAGNOSIS — D225 Melanocytic nevi of trunk: Secondary | ICD-10-CM

## 2019-11-16 DIAGNOSIS — L578 Other skin changes due to chronic exposure to nonionizing radiation: Secondary | ICD-10-CM

## 2019-11-16 DIAGNOSIS — L738 Other specified follicular disorders: Secondary | ICD-10-CM

## 2019-11-16 DIAGNOSIS — L814 Other melanin hyperpigmentation: Secondary | ICD-10-CM

## 2019-11-16 DIAGNOSIS — L821 Other seborrheic keratosis: Secondary | ICD-10-CM

## 2019-11-16 MED ORDER — FLUOCINOLONE ACETONIDE 0.01 % OT OIL: TOPICAL_OIL | OTIC | 2 refills | Status: DC
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
## 2019-11-16 NOTE — Patient Instructions (Signed)

## 2020-04-28 ENCOUNTER — Ambulatory Visit
Admission: EM | Admit: 2020-04-28 | Discharge: 2020-04-28 | Disposition: A | Discharge status: Home / Self Care | Payer: 59 | Attending: Family Medicine | Admitting: Family Medicine

## 2020-04-28 ENCOUNTER — Other Ambulatory Visit: Payer: Self-pay

## 2020-04-28 ENCOUNTER — Ambulatory Visit (INDEPENDENT_AMBULATORY_CARE_PROVIDER_SITE_OTHER): Payer: 59

## 2020-04-28 DIAGNOSIS — R0602 Shortness of breath: Secondary | ICD-10-CM

## 2020-04-28 DIAGNOSIS — Z87891 Personal history of nicotine dependence: Secondary | ICD-10-CM | POA: Insufficient documentation

## 2020-04-28 DIAGNOSIS — J189 Pneumonia, unspecified organism: Secondary | ICD-10-CM

## 2020-04-28 DIAGNOSIS — Z20822 Contact with and (suspected) exposure to covid-19: Secondary | ICD-10-CM | POA: Diagnosis not present

## 2020-04-28 DIAGNOSIS — R059 Cough, unspecified: Secondary | ICD-10-CM | POA: Diagnosis not present

## 2020-04-28 LAB — RESP PANEL BY RT-PCR (FLU A&B, COVID) ARPGX2
Influenza A by PCR: NEGATIVE
Influenza B by PCR: NEGATIVE
SARS Coronavirus 2 by RT PCR: NEGATIVE

## 2020-04-28 MED ORDER — AZITHROMYCIN 250 MG PO TABS: ORAL_TABLET | ORAL | 0 refills | Status: DC

## 2020-04-28 MED ORDER — AMOXICILLIN-POT CLAVULANATE 875-125 MG PO TABS: 1.0000 | ORAL_TABLET | Freq: Two times a day (BID) | ORAL | 0 refills | Status: DC

## 2020-04-28 MED ORDER — HYDROCOD POLST-CPM POLST ER 10-8 MG/5ML PO SUER: 5.0000 mL | Freq: Two times a day (BID) | ORAL | 0 refills | Status: DC | PRN

## 2020-04-28 NOTE — ED Triage Notes (Signed)
Patient states that he has been having shortness of breath, headache and cough x 2 days. Patient states that he has been vaccinated and was positive for covid in September. Will swab again today due to symptoms.

## 2020-04-28 NOTE — ED Provider Notes (Signed)
[facility]    CSN: [id number] Arrival date & time: 04/28/20  0905      History   Chief Complaint Chief Complaint  Patient presents with  . Shortness of Breath   HPI  49 year old male presents with cough and shortness of breath.  Patient reports that he has had symptoms for the past 2 days.  Patient reports ongoing productive cough and associated shortness of breath.  Patient also reports nasal congestion.  No documented fever.  He denies any sick contacts.  He feels very poorly.  No relieving factors.  No other associated symptoms.  No other complaints.  Past Medical History:  Diagnosis Date  . Anxiety   . Depression   . Diabetes ([place])   . Gout   . Heart murmur   . Hypertension   . Obesity   . Pituitary adenoma ([place])   . Sleep apnea    uses cpap    Patient Active Problem List   Diagnosis Date Noted  . Non-pressure chronic ulcer of left ankle, limited to breakdown of skin ([place]) 04/25/2015  . HYPERTENSION 02/22/2008  . SLEEP APNEA 02/22/2008  . PITUITARY NEOPLASM, HX OF 02/22/2008    Past Surgical History:  Procedure Laterality Date  . CHOLECYSTECTOMY    . KNEE ARTHROSCOPY Right 08/23/2015   Procedure: ARTHROSCOPY KNEE, partial medial and lateral menisectomy, medial and lateral chondolar microfracture.;  Surgeon: Leanor Kail, MD;  Location: [facility] ORS;  Service: Orthopedics;  Laterality: Right;  . KNEE ARTHROSCOPY Left 02/21/2016   Procedure: ARTHROSCOPY KNEE;  Surgeon: Leanor Kail, MD;  Location: [facility] OR[facility];  Service: Orthopedics;  Laterality: Left;  . TONSILLECTOMY    . TONSILLECTOMY AND ADENOIDECTOMY    . UVULOPALATOPHARYNGOPLASTY    . WISDOM TOOTH EXTRACTION         Home Medications    Prior to Admission medications   Medication Sig Start Date End Date Taking? Authorizing Provider  allopurinol (ZYLOPRIM) 100 MG tablet Take 100 mg by mouth at bedtime.    Yes [provider]  amoxicillin-clavulanate (AUGMENTIN) 875-125 MG  tablet Take 1 tablet by mouth 2 (two) times daily. 04/28/20  Yes Coral Spikes, DO  aspirin EC 81 MG tablet Take 81 mg by mouth daily.   Yes [provider]  atorvastatin (LIPITOR) 20 MG tablet  06/18/16  Yes [provider]  azithromycin (ZITHROMAX) 250 MG tablet 2 tablets on day 1, then 1 tablet daily on days 2-5. 04/28/20  Yes Catharina Pica G, DO  chlorpheniramine-HYDROcodone (TUSSIONEX PENNKINETIC ER) 10-8 MG/5ML SUER Take 5 mLs by mouth every 12 (twelve) hours as needed. 04/28/20  Yes Dione Petron G, DO  colchicine 0.6 MG tablet Take 0.6 mg by mouth at bedtime.    Yes [provider]  Fluocinolone Acetonide 0.01 % OIL One to two drops to ears, once to twice daily for rash 11/16/19  Yes Brendolyn Patty, MD  FLUoxetine (PROZAC) 20 MG capsule Take 20 mg by mouth at bedtime.    Yes [provider]  fluticasone (FLONASE) 50 MCG/ACT nasal spray Place into both nostrils daily.   Yes [provider]  glucose blood test strip  12/06/15  Yes [provider]  ketoconazole (NIZORAL) 2 % cream Apply 1 application topically 2 (two) times daily as needed for irritation.   Yes [provider]  lisinopril (PRINIVIL,ZESTRIL) 5 MG tablet Take 5 mg by mouth at bedtime.    Yes [provider]  metFORMIN (GLUMETZA) 500 MG (MOD) 24 hr tablet  Take 1,500 mg at bedtime by mouth.    Yes [provider]  [place]  12/06/15  Yes [provider]  tamsulosin (FLOMAX) 0.4 MG CAPS capsule Take 1 capsule (0.4 mg total) by mouth daily. 06/12/18  Yes Norval Gable, MD    Family History Family History  Problem Relation Age of Onset  . Lung cancer Father   . Thyroid disease Mother   . Thyroid disease Sister     Social History Social History   Tobacco Use  . Smoking status: Former Smoker    Packs/day: 1.00    Years: 10.00    Pack years: 10.00    Types: Cigarettes    Quit date: 04/29/1993    Years since quitting:  27.0  . Smokeless tobacco: Former Systems developer    Types: Chew    Quit date: 05/12/2015  Vaping Use  . Vaping Use: Never used  Substance Use Topics  . Alcohol use: Yes    Alcohol/week: 0.0 standard drinks    Comment: rarely  . Drug use: No     Allergies   Patient has no known allergies.   Review of Systems Review of Systems  HENT: Positive for congestion.   Respiratory: Positive for cough and shortness of breath.    Physical Exam Triage Vital Signs ED Triage Vitals  Enc Vitals Group     BP 04/28/20 1025 (!) 189/98     Pulse Rate 04/28/20 1025 91     Resp 04/28/20 1025 20     Temp 04/28/20 1025 99.2 F (37.3 C)     Temp Source 04/28/20 1025 Oral     SpO2 04/28/20 1025 97 %     Weight 04/28/20 1023 (!) 350 lb (158.8 kg)     Height 04/28/20 1023 6' (1.829 m)     Head Circumference --      Peak Flow --      Pain Score 04/28/20 1023 7     Pain Loc --      Pain Edu? --      Excl. in [place]? --    Updated Vital Signs BP (!) 189/98 (BP Location: Left Arm)   Pulse 91   Temp 99.2 F (37.3 C) (Oral)   Resp 20   Ht 6' (1.829 m)   Wt (!) 158.8 kg   SpO2 97%   BMI 47.47 kg/m   Visual Acuity Right Eye Distance:   Left Eye Distance:   Bilateral Distance:    Right Eye Near:   Left Eye Near:    Bilateral Near:     Physical Exam Vitals and nursing note reviewed.  Constitutional:      Appearance: He is obese. He is ill-appearing.  HENT:     Head: Normocephalic and atraumatic.  Eyes:     General:        Right eye: No discharge.        Left eye: No discharge.     Conjunctiva/sclera: Conjunctivae normal.  Cardiovascular:     Rate and Rhythm: Normal rate and regular rhythm.  Pulmonary:     Comments: Basilar crackles. Neurological:     Mental Status: He is alert.  Psychiatric:        Mood and Affect: Mood normal.        Behavior: Behavior normal.    UC Treatments / Results  Labs (all labs ordered are listed, but only abnormal results are displayed) Labs Reviewed   RESP PANEL BY RT-PCR (FLU A&B,  COVID) [id number]    EKG   Radiology DG Chest 2 View  Result Date: 04/28/2020 CLINICAL DATA:  49 year old male with shortness of breath and cough for 2 days. Both vaccinated for and prior COVID-19 infection. EXAM: CHEST - 2 VIEW COMPARISON:  CT Abdomen and Pelvis 09/30/2016. FINDINGS: Lung volumes and mediastinal contours are within normal limits. Visualized tracheal air column is within normal limits. No pneumothorax, pulmonary edema, pleural effusion or confluent pulmonary opacity. There is mild asymmetric left lower lobe peribronchial opacity on the PA view, not well correlated on the lateral. No acute osseous abnormality identified. Negative visible bowel gas pattern. IMPRESSION: Streaky of asymmetric left lower lobe opacity suspicious for Bronchopneumonia in this setting. No pleural effusion. Followup PA and lateral chest X-ray is recommended in 3-4 weeks following therapy to ensure resolution and exclude underlying malignancy. Electronically Signed   By: Odessa Fleming M.D.   On: 04/28/2020 11:35    Procedures Procedures (including critical care time)  Medications Ordered in UC Medications - No data to display  Initial Impression / Assessment and Plan / UC Course  I have reviewed the triage vital signs and the nursing notes.  Pertinent labs & imaging results that were available during my care of the patient were reviewed by me and considered in my medical decision making (see chart for details).    49 year old male presents with cough and shortness of breath.  Strep and Covid negative.  Given clinical findings and presentation, chest x-ray was obtained.  Chest x-ray was independently interpreted by me.  Left lower lobe opacity.  Patient has required pneumonia.  Treating with azithromycin and Augmentin.  Tussionex for cough.  Final Clinical Impressions(s) / UC Diagnoses   Final diagnoses:  Community acquired pneumonia of left lower lobe of lung   Discharge  Instructions   None    ED Prescriptions    Medication Sig Dispense Auth. Provider   azithromycin (ZITHROMAX) 250 MG tablet 2 tablets on day 1, then 1 tablet daily on days 2-5. 6 tablet Kemaria Dedic G, DO   amoxicillin-clavulanate (AUGMENTIN) 875-125 MG tablet Take 1 tablet by mouth 2 (two) times daily. 10 tablet [place], Janese Radabaugh G, DO   chlorpheniramine-HYDROcodone (TUSSIONEX PENNKINETIC ER) 10-8 MG/5ML SUER Take 5 mLs by mouth every 12 (twelve) hours as needed. 115 mL Tommie Sams, DO     PDMP not reviewed this encounter.   Tommie Sams, [place] 04/28/20 1229

## 2020-09-14 ENCOUNTER — Ambulatory Visit
Admission: EM | Admit: 2020-09-14 | Discharge: 2020-09-14 | Disposition: A | Discharge status: Home / Self Care | Payer: 59 | Attending: Sports Medicine | Admitting: Sports Medicine

## 2020-09-14 ENCOUNTER — Other Ambulatory Visit: Payer: Self-pay

## 2020-09-14 ENCOUNTER — Encounter: Payer: Self-pay | Admitting: Emergency Medicine

## 2020-09-14 DIAGNOSIS — S81812A Laceration without foreign body, left lower leg, initial encounter: Secondary | ICD-10-CM

## 2020-09-14 DIAGNOSIS — Z23 Encounter for immunization: Secondary | ICD-10-CM

## 2020-09-14 DIAGNOSIS — S8992XA Unspecified injury of left lower leg, initial encounter: Secondary | ICD-10-CM

## 2020-09-14 MED ORDER — TETANUS-DIPHTH-ACELL PERTUSSIS 5-2.5-18.5 LF-MCG/0.5 IM SUSY
0.5000 mL | PREFILLED_SYRINGE | Freq: Once | INTRAMUSCULAR | Status: AC
  Administered 2020-09-14: 0.5 mL via INTRAMUSCULAR

## 2020-09-14 NOTE — Discharge Instructions (Addendum)
As we discussed, the laceration on your leg was tiny and not bleeding.  I inspected it for any retained foreign bodies and did not appreciate any.  I did not feel sutures were in your best interest.  We used Dermabond. Please see the educational handouts.  This should fall off in the next 4 to 5 days just like it would a scab. If you develop any redness, warmth, fevers, or concerns about infection, please see your primary care provider, come back here, or go to the ER. As we discussed, you are at 5 years on your tetanus and given that this was a laceration sustained outside I recommend going ahead and giving a booster.

## 2020-09-14 NOTE — ED Triage Notes (Addendum)
Pt has a laceration on his left lower leg. Occurred about an hour ago. He states he cut it with a saw while cutting a tree. He called EMS and was told to come get it glued. Last tetanus 08/06/2015

## 2020-09-16 DIAGNOSIS — S81812A Laceration without foreign body, left lower leg, initial encounter: Secondary | ICD-10-CM | POA: Diagnosis not present

## 2020-09-16 NOTE — ED Provider Notes (Signed)
[facility]    CSN: [id number] Arrival date & time: 09/14/20  1207      History   Chief Complaint Chief Complaint  Patient presents with  . Laceration    HPI Jacob [name] is a 50 y.o. male.   Patient is a pleasant 50 year old male who presents for evaluation of the above issue.  He normally sees Scientist, forensic at [facility] here in [place].  He is self-employed.  He reports cutting his left lower leg just prior to arrival.  He cut it on a saw while he was cutting a tree at home.  Given the amount of bleeding he comes in to the urgent care for evaluation.  His wife called EMS and they advised him to come to the urgent care and have it either sutured or glued.  His last tetanus was about 5 years ago.  He reports not a lot of pain.  No fever shakes chills.  No nausea vomiting diarrhea.  No red flag signs or symptoms elicited on history.       Past Medical History:  Diagnosis Date  . Anxiety   . Depression   . Diabetes ([place])   . Gout   . Heart murmur   . Hypertension   . Obesity   . Pituitary adenoma ([place])   . Sleep apnea    uses cpap    Patient Active Problem List   Diagnosis Date Noted  . Non-pressure chronic ulcer of left ankle, limited to breakdown of skin ([place]) 04/25/2015  . HYPERTENSION 02/22/2008  . SLEEP APNEA 02/22/2008  . PITUITARY NEOPLASM, HX OF 02/22/2008    Past Surgical History:  Procedure Laterality Date  . CHOLECYSTECTOMY    . KNEE ARTHROSCOPY Right 08/23/2015   Procedure: ARTHROSCOPY KNEE, partial medial and lateral menisectomy, medial and lateral chondolar microfracture.;  Surgeon: Leanor Kail, MD;  Location: [facility] ORS;  Service: Orthopedics;  Laterality: Right;  . KNEE ARTHROSCOPY Left 02/21/2016   Procedure: ARTHROSCOPY KNEE;  Surgeon: Leanor Kail, MD;  Location: [facility] OR[facility];  Service: Orthopedics;  Laterality: Left;  . TONSILLECTOMY    . TONSILLECTOMY AND ADENOIDECTOMY    . UVULOPALATOPHARYNGOPLASTY    . WISDOM TOOTH  EXTRACTION         Home Medications    Prior to Admission medications   Medication Sig Start Date End Date Taking? Authorizing Provider  allopurinol (ZYLOPRIM) 100 MG tablet Take 100 mg by mouth at bedtime.    Yes [provider]  aspirin EC 81 MG tablet Take 81 mg by mouth daily.   Yes [provider]  atorvastatin (LIPITOR) 20 MG tablet  06/18/16  Yes [provider]  colchicine 0.6 MG tablet Take 0.6 mg by mouth at bedtime.    Yes [provider]  FLUoxetine (PROZAC) 20 MG capsule Take 20 mg by mouth at bedtime.    Yes [provider]  fluticasone (FLONASE) 50 MCG/ACT nasal spray Place into both nostrils daily.   Yes [provider]  lisinopril (PRINIVIL,ZESTRIL) 5 MG tablet Take 5 mg by mouth at bedtime.    Yes [provider]  metFORMIN (GLUMETZA) 500 MG (MOD) 24 hr tablet Take 1,500 mg at bedtime by mouth.    Yes [provider]  tamsulosin (FLOMAX) 0.4 MG CAPS capsule Take 1 capsule (0.4 mg total) by mouth daily. 06/12/18  Yes Norval Gable, MD  amoxicillin-clavulanate (AUGMENTIN) 875-125 MG tablet Take 1 tablet by mouth 2 (two) times daily. 04/28/20   Coral Spikes,  DO  azithromycin (ZITHROMAX) 250 MG tablet 2 tablets on day 1, then 1 tablet daily on days 2-5. 04/28/20   Coral Spikes, DO  chlorpheniramine-HYDROcodone (TUSSIONEX PENNKINETIC ER) 10-8 MG/5ML SUER Take 5 mLs by mouth every 12 (twelve) hours as needed. 04/28/20   Coral Spikes, DO  Fluocinolone Acetonide 0.01 % OIL One to two drops to ears, once to twice daily for rash 11/16/19   Brendolyn Patty, MD  glucose blood test strip  12/06/15   [provider]  ketoconazole (NIZORAL) 2 % cream Apply 1 application topically 2 (two) times daily as needed for irritation.    [provider]  Jonetta Speak LANCETS FINE [place]  12/06/15   [provider]    Family History Family History  Problem Relation Age of Onset  . Lung cancer Father    . Thyroid disease Mother   . Thyroid disease Sister     Social History Social History   Tobacco Use  . Smoking status: Former Smoker    Packs/day: 1.00    Years: 10.00    Pack years: 10.00    Types: Cigarettes    Quit date: 04/29/1993    Years since quitting: 27.4  . Smokeless tobacco: Former Systems developer    Types: Chew    Quit date: 05/12/2015  Vaping Use  . Vaping Use: Never used  Substance Use Topics  . Alcohol use: Yes    Alcohol/week: 0.0 standard drinks    Comment: rarely  . Drug use: No     Allergies   Patient has no known allergies.   Review of Systems Review of Systems  Constitutional: Positive for activity change. Negative for appetite change, chills, diaphoresis, fatigue and fever.  HENT: Negative for congestion, ear pain and sore throat.   Eyes: Negative for pain.  Respiratory: Negative for cough, chest tightness and shortness of breath.   Cardiovascular: Negative for chest pain and palpitations.  Gastrointestinal: Negative for abdominal pain, diarrhea, nausea and vomiting.  Genitourinary: Negative for dysuria.  Musculoskeletal: Negative for back pain, myalgias and neck pain.  Skin: Positive for color change and wound. Negative for rash.  Neurological: Negative for dizziness, seizures, syncope, light-headedness, numbness and headaches.  All other systems reviewed and are negative.    Physical Exam Triage Vital Signs ED Triage Vitals [09/14/20 1312]  Enc Vitals Group     BP (!) 154/83     Pulse Rate 81     Resp 18     Temp 98.2 F (36.8 C)     Temp Source Oral     SpO2 96 %     Weight (!) 350 lb 1.5 oz (158.8 kg)     Height 6' (1.829 m)     Head Circumference      Peak Flow      Pain Score 3     Pain Loc      Pain Edu?      Excl. in [place]?    No data found.  Updated Vital Signs BP (!) 154/83 (BP Location: Left Arm)   Pulse 81   Temp 98.2 F (36.8 C) (Oral)   Resp 18   Ht 6' (1.829 m)   Wt (!) 158.8 kg   SpO2 96%   BMI 47.48 kg/m    Visual Acuity Right Eye Distance:   Left Eye Distance:   Bilateral Distance:    Right Eye Near:   Left Eye Near:    Bilateral Near:     Physical  Exam Constitutional:      General: He is not in acute distress.    Appearance: Normal appearance. He is not ill-appearing, toxic-appearing or diaphoretic.  HENT:     Head: Normocephalic and atraumatic.     Nose: Nose normal.     Mouth/Throat:     Mouth: Mucous membranes are moist.  Eyes:     Conjunctiva/sclera: Conjunctivae normal.     Pupils: Pupils are equal, round, and reactive to light.  Cardiovascular:     Rate and Rhythm: Normal rate and regular rhythm.     Pulses: Normal pulses.     Heart sounds: Normal heart sounds. No murmur heard. No friction rub. No gallop.   Pulmonary:     Effort: Pulmonary effort is normal.     Breath sounds: Normal breath sounds. No stridor. No wheezing, rhonchi or rales.  Musculoskeletal:     Cervical back: Normal range of motion and neck supple.  Skin:    General: Skin is warm.     Capillary Refill: Capillary refill takes less than 2 seconds.     Findings: Signs of injury, laceration and wound present. No abscess, bruising, ecchymosis, erythema, petechiae or rash.     Comments: 2 cm vertical laceration noted left lower extremity just lateral to the tibia about 10 to 12 cm proximal to the lateral malleolus.  No foreign body appreciated.  There are some minimal bleeding.  Skin is in good approximation.  Laceration is concentrated over the muscle belly of the tibialis anterior.  It is intact without any loss of function.  Sensation also intact.  Neurological:     General: No focal deficit present.     Mental Status: He is alert and oriented to person, place, and time.      UC Treatments / Results  Labs (all labs ordered are listed, but only abnormal results are displayed) Labs Reviewed - No data to display  EKG   Radiology No results found.  Procedures Laceration Repair  Date/Time:  09/16/2020 10:48 PM Performed by: Verda Cumins, MD Authorized by: Verda Cumins, MD   Consent:    Consent obtained:  Verbal   Consent given by:  Patient   Risks, benefits, and alternatives were discussed: yes     Risks discussed:  Infection, pain, retained foreign body, poor wound healing and need for additional repair   Alternatives discussed:  Observation and no treatment Universal protocol:    Procedure explained and questions answered to patient or proxy's satisfaction: yes     Immediately prior to procedure, a time out was called: yes     Patient identity confirmed:  Arm band Anesthesia:    Anesthesia method:  None Laceration details:    Location:  Leg   Leg location:  L lower leg   Length (cm):  2   Depth (mm):  2 Pre-procedure details:    Preparation:  Patient was prepped and draped in usual sterile fashion Exploration:    Limited defect created (wound extended): no     Hemostasis achieved with:  Direct pressure   Imaging outcome: foreign body not noted     Wound exploration: entire depth of wound visualized     Contaminated: no   Treatment:    Area cleansed with:  Povidone-iodine   Amount of cleaning:  Standard   Irrigation solution:  Sterile water   Irrigation volume:  20 ml   Irrigation method:  Syringe   Debridement:  None   Undermining:  None   Scar  revision: no   Skin repair:    Repair method:  Tissue adhesive Approximation:    Approximation:  Close Repair type:    Repair type:  Simple Post-procedure details:    Dressing:  Non-adherent dressing   Procedure completion:  Tolerated well, no immediate complications   (including critical care time)  Medications Ordered in UC Medications  Tdap (BOOSTRIX) injection 0.5 mL (0.5 mLs Intramuscular Given 09/14/20 1344)    Initial Impression / Assessment and Plan / UC Course  I have reviewed the triage vital signs and the nursing notes.  Pertinent labs & imaging results that were available during my care  of the patient were reviewed by me and considered in my medical decision making (see chart for details).   Clinical impression: Laceration to the left lower extremity.  Treatment plan: 1.  The findings and treatment plan were discussed in detail with the patient.  Patient was in agreement. 2.  We discussed suturing the area but I felt it would be better to glue it.  Verbal consent was obtained.  Procedure outlined above. 3.  Area was covered with a Bioclusive dressing. 4.  His tetanus was about 5 years ago.  Given that he was using a saw outside and felt it was prudent go ahead and give him a booster.  This was ordered and dispensed in the office. 4.  Educational handouts were provided for Dermabond and laceration care. 5.  Red flag signs and symptoms were discussed which included but were not limited to redness around the area.  Fever, concern for infection, nausea or vomiting.  If that was to occur he should seek out medical care.  If it is after hours she should go to the emergency room. 6.  He is self-employed so he did not need a work note. 7.  Over-the-counter meds as needed, Tylenol and Motrin for any discomfort. 8.  He was discharged in stable condition and he will follow-up here as needed.    Final Clinical Impressions(s) / UC Diagnoses   Final diagnoses:  Laceration of left lower extremity, initial encounter  Leg injury, left, initial encounter     Discharge Instructions     As we discussed, the laceration on your leg was tiny and not bleeding.  I inspected it for any retained foreign bodies and did not appreciate any.  I did not feel sutures were in your best interest.  We used Dermabond. Please see the educational handouts.  This should fall off in the next 4 to 5 days just like it would a scab. If you develop any redness, warmth, fevers, or concerns about infection, please see your primary care provider, come back here, or go to the ER. As we discussed, you are at 5 years  on your tetanus and given that this was a laceration sustained outside I recommend going ahead and giving a booster.    ED Prescriptions    None     PDMP not reviewed this encounter.   Verda Cumins, MD 09/16/20 [phone]

## 2020-11-28 ENCOUNTER — Encounter: Payer: Self-pay | Admitting: Dermatology

## 2020-11-28 ENCOUNTER — Ambulatory Visit (INDEPENDENT_AMBULATORY_CARE_PROVIDER_SITE_OTHER): Payer: 59 | Admitting: Dermatology

## 2020-11-28 ENCOUNTER — Other Ambulatory Visit: Payer: Self-pay

## 2020-11-28 DIAGNOSIS — L918 Other hypertrophic disorders of the skin: Secondary | ICD-10-CM

## 2020-11-28 DIAGNOSIS — Z86018 Personal history of other benign neoplasm: Secondary | ICD-10-CM

## 2020-11-28 DIAGNOSIS — D229 Melanocytic nevi, unspecified: Secondary | ICD-10-CM

## 2020-11-28 DIAGNOSIS — L570 Actinic keratosis: Secondary | ICD-10-CM

## 2020-11-28 DIAGNOSIS — D225 Melanocytic nevi of trunk: Secondary | ICD-10-CM

## 2020-11-28 DIAGNOSIS — L219 Seborrheic dermatitis, unspecified: Secondary | ICD-10-CM

## 2020-11-28 DIAGNOSIS — Z1283 Encounter for screening for malignant neoplasm of skin: Secondary | ICD-10-CM | POA: Diagnosis not present

## 2020-11-28 DIAGNOSIS — L814 Other melanin hyperpigmentation: Secondary | ICD-10-CM

## 2020-11-28 DIAGNOSIS — L578 Other skin changes due to chronic exposure to nonionizing radiation: Secondary | ICD-10-CM

## 2020-11-28 DIAGNOSIS — L738 Other specified follicular disorders: Secondary | ICD-10-CM

## 2020-11-28 DIAGNOSIS — B079 Viral wart, unspecified: Secondary | ICD-10-CM

## 2020-11-28 MED ORDER — HYDROCORTISONE 2.5 % EX CREA: TOPICAL_CREAM | CUTANEOUS | 3 refills | Status: DC

## 2020-11-28 NOTE — Progress Notes (Signed)
Follow-Up Visit   Subjective  Jacob [name] is a 50 y.o. male who presents for the following: Annual Exam (Patient presents for TBSE. He has a history of dysplastic nevi and Aks. He has seborrheic dermatitis of the ears and chest . He uses hydrocortisone 2.5% cream as needed. He tried Dermotic oil, but it didn't help.).No changes in moles   The following portions of the chart were reviewed this encounter and updated as appropriate:       Review of Systems:  No other skin or systemic complaints except as noted in HPI or Assessment and Plan.  Objective  Well appearing patient in no apparent distress; mood and affect are within normal limits.  A focused examination was performed including all skin waist up and legs. Relevant physical exam findings are noted in the Assessment and Plan.  trunk Left spinal mid lower back: 6 x 4 mm flesh papule with brown rim superior   Right Abdomen (side): 6 x 4 mm med brown macule darker inferior         Ear canal Mild erythema and scale of the left ear canal.  Left Temple Pink scaly papules   R nasal dorsum 4 x 3 mm pink yellow papule    L inferior nostril Tiny Filiform papule on medial rim nostril  Flesh papule inner L nostril c/w nevus, not wart- not treated   Assessment & Plan  Skin cancer screening performed today.  Actinic Damage - chronic, secondary to cumulative UV radiation exposure/sun exposure over time - diffuse scaly erythematous macules with underlying dyspigmentation - Recommend daily broad spectrum sunscreen SPF 30+ to sun-exposed areas, reapply every 2 hours as needed.  - Recommend staying in the shade or wearing long sleeves, sun glasses (UVA+UVB protection) and wide brim hats (4-inch brim around the entire circumference of the hat). - Call for new or changing lesions.  Acrochordons (Skin Tags) - Fleshy, skin-colored pedunculated papules - Benign appearing.  - Observe. - If desired, they can be removed  with an in office procedure that is not covered by insurance. - Please call the clinic if you notice any new or changing lesions.  Lentigines - Scattered tan macules - Due to sun exposure - Benign-appering, observe - Recommend daily broad spectrum sunscreen SPF 30+ to sun-exposed areas, reapply every 2 hours as needed. - Call for any changes  Melanocytic Nevi - Tan-brown and/or pink-flesh-colored symmetric macules and papules - Benign appearing on exam today - Observation - Call clinic for new or changing moles - Recommend daily use of broad spectrum spf 30+ sunscreen to sun-exposed areas.   History of Dysplastic Nevi - No evidence of recurrence today - Recommend regular full body skin exams - Recommend daily broad spectrum sunscreen SPF 30+ to sun-exposed areas, reapply every 2 hours as needed.  - Call if any new or changing lesions are noted between office visits   Nevus trunk  Benign-appearing.  Observation.  Call clinic for new or changing moles.  Recommend daily use of broad spectrum spf 30+ sunscreen to sun-exposed areas.   Seborrheic dermatitis Ear canal  Seborrheic Dermatitis  -  is a chronic persistent rash characterized by pinkness and scaling most commonly of the mid face but also can occur on the scalp (dandruff), ears; mid chest and mid back. It tends to be exacerbated by stress and cooler weather.  People who have neurologic disease may experience new onset or exacerbation of existing seborrheic dermatitis.  The condition is not curable but  treatable and can be controlled.  Continue HC 2.5% cream qd/bid to ear/chest prn flares  hydrocortisone 2.5 % cream - Ear canal Apply to affected areas rash ears, chest 1-2 times a day until improved.  AK (actinic keratosis) Left Temple  Vs ISK  Actinic keratoses are precancerous spots that appear secondary to cumulative UV radiation exposure/sun exposure over time. They are chronic with expected duration over 1 year. A  portion of actinic keratoses will progress to squamous cell carcinoma of the skin. It is not possible to reliably predict which spots will progress to skin cancer and so treatment is recommended to prevent development of skin cancer.  Recommend daily broad spectrum sunscreen SPF 30+ to sun-exposed areas, reapply every 2 hours as needed.  Recommend staying in the shade or wearing long sleeves, sun glasses (UVA+UVB protection) and wide brim hats (4-inch brim around the entire circumference of the hat). Call for new or changing lesions.  Destruction of lesion - Left Temple  Destruction method: cryotherapy   Informed consent: discussed and consent obtained   Lesion destroyed using liquid nitrogen: Yes   Region frozen until ice ball extended beyond lesion: Yes   Outcome: patient tolerated procedure well with no complications   Post-procedure details: wound care instructions given   Additional details:  Prior to procedure, discussed risks of blister formation, small wound, skin dyspigmentation, or rare scar following cryotherapy. Recommend Vaseline ointment to treated areas while healing.   Sebaceous hyperplasia R nasal dorsum  Vs Nevus  Stable. Benign. Observe.   Viral warts, unspecified type L inferior nostril  Discussed viral etiology and risk of spread.  Discussed multiple treatments may be required to clear warts.  Discussed possible post-treatment dyspigmentation and risk of recurrence.  Destruction of lesion - L inferior nostril  Destruction method: cryotherapy   Informed consent: discussed and consent obtained   Lesion destroyed using liquid nitrogen: Yes   Region frozen until ice ball extended beyond lesion: Yes   Outcome: patient tolerated procedure well with no complications   Post-procedure details: wound care instructions given   Additional details:  Prior to procedure, discussed risks of blister formation, small wound, skin dyspigmentation, or rare scar following  cryotherapy. Recommend Vaseline ointment to treated areas while healing.   Return in about 1 year (around 11/28/2021) for TBSE.  Documentation: I have reviewed the above documentation for accuracy and completeness, and I agree with the above.  Brendolyn Patty MD

## 2020-11-28 NOTE — Patient Instructions (Addendum)
Cryotherapy Aftercare  Wash gently with soap and water everyday.   Apply Vaseline and Band-Aid daily until healed.   Viral Warts & Molluscum Contagiosum  Viral warts and molluscum contagiosum are growths of the skin caused by viral infection of the skin. If you have been given the diagnosis of viral warts or molluscum contagiosum there are a few things that you must understand about your condition:  There is no guaranteed treatment method available for this condition. Multiple treatments may be required, The treatments may be time consuming and require multiple visits to the dermatology office. The treatment may be expensive. You will be charged each time you come into the office to have the spots treated. The treated areas may develop new lesions further complicating treatment. The treated areas may leave a scar. There is no guarantee that even after multiple treatments that the spots will be successfully treated. These are caused by a viral infection and can be spread to other areas of the skin and to other people by direct contact. Therefore, new spots may occur.  If you have any questions or concerns for your doctor, please call our main line at [phone] and press option 4 to reach your doctor's medical assistant. If no one answers, please leave a voicemail as directed and we will return your call as soon as possible. Messages left after 4 pm will be answered the following business day.   You may also send [place] a message via [place]. We typically respond to MyChart messages within 1-2 business days.  For prescription refills, please ask your pharmacy to contact our office. Our fax number is [phone].  If you have an urgent issue when the clinic is closed that cannot wait until the next business day, you can page your doctor at the number below.    Please note that while we do our best to be available for urgent issues outside of office hours, we are not available 24/7.   If you  have an urgent issue and are unable to reach [place], you may choose to seek medical care at your doctor's office, retail clinic, urgent care center, or emergency room.  If you have a medical emergency, please immediately call 911 or go to the emergency department.  Pager Numbers  - Dr. Nehemiah Massed: [phone]  - Dr. Laurence Ferrari: [phone]  - Dr. Nicole Kindred: [phone]  In the event of inclement weather, please call our main line at [phone] for an update on the status of any delays or closures.  Dermatology Medication Tips: Please keep the boxes that topical medications come in in order to help keep track of the instructions about where and how to use these. Pharmacies typically print the medication instructions only on the boxes and not directly on the medication tubes.   If your medication is too expensive, please contact our office at [phone] option 4 or send [place] a message through [place].   We are unable to tell what your co-pay for medications will be in advance as this is different depending on your insurance coverage. However, we may be able to find a substitute medication at lower cost or fill out paperwork to get insurance to cover a needed medication.   If a prior authorization is required to get your medication covered by your insurance company, please allow [place] 1-2 business days to complete this process.  Drug prices often vary depending on where the prescription is filled and some pharmacies may offer cheaper prices.  The website www.goodrx.com contains coupons  for medications through different pharmacies. The prices here do not account for what the cost may be with help from insurance (it may be cheaper with your insurance), but the website can give you the price if you did not use any insurance.  - You can print the associated coupon and take it with your prescription to the pharmacy.  - You may also stop by our office during regular business hours and pick up a GoodRx coupon  card.  - If you need your prescription sent electronically to a different pharmacy, notify our office through [facility] or by phone at [phone] option 4.

## 2021-01-18 ENCOUNTER — Other Ambulatory Visit: Payer: Self-pay

## 2021-01-18 ENCOUNTER — Ambulatory Visit
Admission: EM | Admit: 2021-01-18 | Discharge: 2021-01-18 | Disposition: A | Discharge status: Home / Self Care | Payer: 59 | Attending: Emergency Medicine | Admitting: Emergency Medicine

## 2021-01-18 ENCOUNTER — Ambulatory Visit (INDEPENDENT_AMBULATORY_CARE_PROVIDER_SITE_OTHER): Payer: 59

## 2021-01-18 DIAGNOSIS — M79645 Pain in left finger(s): Secondary | ICD-10-CM | POA: Diagnosis not present

## 2021-01-18 MED ORDER — IBUPROFEN 800 MG PO TABS: 800.0000 mg | ORAL_TABLET | Freq: Three times a day (TID) | ORAL | 0 refills | Status: DC

## 2021-01-18 NOTE — Discharge Instructions (Addendum)
Your x-ray today did not show injury to the bone, ligaments or tendons of your finger. Your pain is most likely being caused by irritation to the soft tissues, this should improve as time progresses.   Use ibuprofen three times a day with food for the next 5 days then may use as needed  You may apply heat or ice, whichever makes you feel better, to affected area in 15 minute intervals  You may continue activity as tolerated, there is no injury therefore, it is important that you continue to move around so you do not loose strength to the area  May return to urgent care for persisting pain as needed
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
## 2021-08-21 DIAGNOSIS — F32A Depression, unspecified: Secondary | ICD-10-CM | POA: Insufficient documentation

## 2021-08-21 DIAGNOSIS — M5430 Sciatica, unspecified side: Secondary | ICD-10-CM | POA: Insufficient documentation

## 2021-08-21 DIAGNOSIS — M1A0790 Idiopathic chronic gout, unspecified ankle and foot, without tophus (tophi): Secondary | ICD-10-CM | POA: Insufficient documentation

## 2021-12-11 ENCOUNTER — Ambulatory Visit: Payer: 59 | Admitting: Dermatology

## 2022-05-14 ENCOUNTER — Ambulatory Visit: Payer: 59 | Admitting: Dermatology

## 2022-05-29 ENCOUNTER — Ambulatory Visit: Payer: 59 | Admitting: Dermatology

## 2022-06-19 ENCOUNTER — Encounter: Payer: Self-pay | Admitting: Dermatology

## 2023-06-05 ENCOUNTER — Ambulatory Visit (INDEPENDENT_AMBULATORY_CARE_PROVIDER_SITE_OTHER): Payer: 59 | Admitting: Physician Assistant

## 2023-06-05 ENCOUNTER — Telehealth: Payer: Self-pay

## 2023-06-05 ENCOUNTER — Telehealth: Payer: Self-pay | Admitting: Physician Assistant

## 2023-06-05 VITALS — BP 138/78 | HR 72 | Temp 98.2°F | Ht 72.0 in | Wt 337.0 lb

## 2023-06-05 DIAGNOSIS — E66813 Obesity, class 3: Secondary | ICD-10-CM | POA: Insufficient documentation

## 2023-06-05 DIAGNOSIS — R809 Proteinuria, unspecified: Secondary | ICD-10-CM

## 2023-06-05 DIAGNOSIS — E1169 Type 2 diabetes mellitus with other specified complication: Secondary | ICD-10-CM

## 2023-06-05 DIAGNOSIS — M5432 Sciatica, left side: Secondary | ICD-10-CM | POA: Diagnosis not present

## 2023-06-05 DIAGNOSIS — E785 Hyperlipidemia, unspecified: Secondary | ICD-10-CM | POA: Diagnosis not present

## 2023-06-05 DIAGNOSIS — M1A0790 Idiopathic chronic gout, unspecified ankle and foot, without tophus (tophi): Secondary | ICD-10-CM

## 2023-06-05 DIAGNOSIS — E1129 Type 2 diabetes mellitus with other diabetic kidney complication: Secondary | ICD-10-CM

## 2023-06-05 DIAGNOSIS — Z7984 Long term (current) use of oral hypoglycemic drugs: Secondary | ICD-10-CM

## 2023-06-05 DIAGNOSIS — E8881 Metabolic syndrome: Secondary | ICD-10-CM

## 2023-06-05 DIAGNOSIS — Z6841 Body Mass Index (BMI) 40.0 and over, adult: Secondary | ICD-10-CM

## 2023-06-05 LAB — POCT GLYCOSYLATED HEMOGLOBIN (HGB A1C): Hemoglobin A1C: 8.7 % — AB (ref 4.0–5.6)

## 2023-06-05 MED ORDER — MELOXICAM 15 MG PO TABS: 15.0000 mg | ORAL_TABLET | Freq: Every day | ORAL | 0 refills | Status: DC

## 2023-06-05 MED ORDER — GLUCOSE BLOOD VI STRP: ORAL_STRIP | 5 refills | Status: AC

## 2023-06-05 MED ORDER — TIRZEPATIDE 2.5 MG/0.5ML ~~LOC~~ SOAJ: 2.5000 mg | SUBCUTANEOUS | 0 refills | Status: DC

## 2023-06-05 NOTE — Progress Notes (Signed)
 Date:  06/05/2023   Name:  Jacob [name]   DOB:  [birth year]   MRN:  [id number]   Chief Complaint: Establish Care, Neck Pain, and Sciatica (Wants to get an injection pt used to see someone who gave him injection- Dr. Alvia???)  Neck Pain  This is a new problem. Episode onset: X2 week. The problem occurs constantly. The problem has been gradually improving. The pain is present in the right side. The quality of the pain is described as aching. The pain is at a severity of 3/10. The pain is mild. The symptoms are aggravated by twisting. The pain is Same all the time. Stiffness is present All day. He has tried heat and ice (lidocaine  patches) for the symptoms.   Jacob [name] is a pleasant 53 y.o. male with a history of DM2, class 3 obesity, HLD, HTN, metabolic syndrome, gout, depression, and pituitary microadenoma who presents new to the clinic today to establish care.  Overdue for routine care, states his last insurance plan made his out-of-pocket expenses and co-pays very high.  He switched insurance, but this made his last PCP out of network.  Last A1c 8.5% July 2024.  He was on Jardiance for 1 month, but this became unaffordable.  He has never tried any GLP-1 RA's.  Patient knows he is due for eye exam.  Endorses current pituitary microadenoma, apparently stable, says he had multiple opinions regarding the pros and cons of intervention, and ultimately it was determined that no surgical intervention was warranted.  Patient states that as a result he has hyperprolactinemia and low testosterone .  On chart review, I see MRI of the brain 04/04/2015 revealing a 6 x 7 mm pituitary microadenoma with mild interval growth since 2012   Medication list has been reviewed and updated.  Current Meds  Medication Sig   allopurinol  (ZYLOPRIM ) 100 MG tablet Take 100 mg by mouth at bedtime.    amLODipine  (NORVASC ) 5 MG tablet Take by mouth.   Ascorbic Acid (VITA-C PO) Take by mouth.   aspirin  EC 81 MG tablet  Take 81 mg by mouth daily.   atorvastatin  (LIPITOR) 20 MG tablet    FLUoxetine  (PROZAC ) 20 MG capsule Take 20 mg by mouth at bedtime.    fluticasone  (FLONASE ) 50 MCG/ACT nasal spray Place into both nostrils daily.   lisinopril  (ZESTRIL ) 20 MG tablet Take 20 mg by mouth every morning.   meloxicam  (MOBIC ) 15 MG tablet Take 1 tablet (15 mg total) by mouth daily.   metFORMIN  (GLUMETZA ) 500 MG (MOD) 24 hr tablet Take 1,500 mg at bedtime by mouth.    ONETOUCH DELICA LANCETS FINE MISC    tirzepatide  (MOUNJARO ) 2.5 MG/0.5ML Pen Inject 2.5 mg into the skin once a week.   Turmeric (QC TUMERIC COMPLEX PO) Take by mouth.     Review of Systems  Musculoskeletal:  Positive for neck pain.    Patient Active Problem List   Diagnosis Date Noted   Metabolic syndrome 06/05/2023   Class 3 severe obesity with serious comorbidity and body mass index (BMI) of 45.0 to 49.9 in adult [facility]) 06/05/2023   Microalbuminuria due to type 2 diabetes mellitus (HCC) 06/05/2023   Sciatica, left side 06/05/2023   Chronic idiopathic gout involving toe without tophus 08/21/2021   Depression 08/21/2021   Arthritis of facet joint of lumbar spine 10/19/2019   Chondromalacia of left knee 05/29/2016   Complex tear of medial meniscus of left knee as current injury 04/12/2016   Non-pressure chronic ulcer  of left ankle, limited to breakdown of skin (HCC) 04/25/2015   Type 2 diabetes mellitus with hyperlipidemia (HCC) 05/07/2013   Hearing loss 05/07/2013   Primary hypertension 02/22/2008   OSA (obstructive sleep apnea) 02/22/2008   Pituitary microadenoma with hyperprolactinemia (HCC) 02/22/2008    No Known Allergies  Immunization History  Administered Date(s) Administered   Influenza Inj Mdck Quad Pf 01/28/2020, 02/19/2021   Influenza, Seasonal, Injecte, Preservative Fre 05/07/2013   Influenza,inj,Quad PF,6+ Mos 04/04/2017, 06/26/2018, 04/15/2019   Influenza-Unspecified 04/23/1989, 02/28/2016, 04/24/2023   PFIZER(Purple  Top)SARS-COV-2 Vaccination 05/13/2019, 06/03/2019, 03/24/2020   PNEUMOCOCCAL CONJUGATE-20 02/19/2021   Pneumococcal Polysaccharide-23 06/18/2016   Tdap 08/03/2015, 09/14/2020    Past Surgical History:  Procedure Laterality Date   CHOLECYSTECTOMY     KNEE ARTHROSCOPY Right 08/23/2015   Procedure: ARTHROSCOPY KNEE, partial medial and lateral menisectomy, medial and lateral chondolar microfracture.;  Surgeon: Jacob Glenn, MD;  Location: [facility];  Service: Orthopedics;  Laterality: Right;   KNEE ARTHROSCOPY Left 02/21/2016   Procedure: ARTHROSCOPY KNEE;  Surgeon: Jacob Glenn, MD;  Location: [facility];  Service: Orthopedics;  Laterality: Left;   TONSILLECTOMY     TONSILLECTOMY AND ADENOIDECTOMY     UVULOPALATOPHARYNGOPLASTY     WISDOM TOOTH EXTRACTION      Social History   Tobacco Use   Smoking status: Former    Current packs/day: 0.00    Average packs/day: 1.3 packs/day for 15.0 years (20.0 ttl pk-yrs)    Types: Cigarettes    Start date: 04/30/1983    Quit date: 04/29/1993    Years since quitting: 30.1   Smokeless tobacco: Former    Types: Chew    Quit date: 05/12/2015  Vaping Use   Vaping status: Never Used  Substance Use Topics   Alcohol use: Yes    Comment: rarely   Drug use: No    Family History  Problem Relation Age of Onset   Thyroid disease Mother    Hypertension Mother    Lung cancer Father    Cancer Father    Thyroid disease Sister    Diabetes Maternal Grandmother    Heart disease Paternal Grandmother         06/05/2023    1:27 PM  GAD 7 : Generalized Anxiety Score  Nervous, Anxious, on Edge 0  Control/stop worrying 0  Worry too much - different things 0  Trouble relaxing 0  Restless 0  Easily annoyed or irritable 0  Afraid - awful might happen 0  Total GAD 7 Score 0  Anxiety Difficulty Not difficult at all       06/05/2023    1:27 PM  Depression screen PHQ 2/9  Decreased Interest 0  Down, Depressed, Hopeless 0  PHQ - 2 Score 0    BP  Readings from Last 3 Encounters:  06/05/23 138/78  01/18/21 (!) 160/75  09/14/20 (!) 154/83    Wt Readings from Last 3 Encounters:  06/05/23 (!) 337 lb (152.9 kg)  01/18/21 (!) 350 lb (158.8 kg)  09/14/20 (!) 350 lb 1.5 oz (158.8 kg)    BP 138/78   Pulse 72   Temp 98.2 F (36.8 C)   Ht 6' (1.829 m)   Wt (!) 337 lb (152.9 kg)   SpO2 96%   BMI 45.71 kg/m   Physical Exam Vitals and nursing note reviewed.  Constitutional:      Appearance: Normal appearance. He is obese.  Neck:     Vascular: No carotid bruit.  Cardiovascular:  Rate and Rhythm: Normal rate and regular rhythm.     Heart sounds: No murmur heard.    No friction rub. No gallop.  Pulmonary:     Effort: Pulmonary effort is normal.     Breath sounds: Normal breath sounds.  Abdominal:     General: There is no distension.  Musculoskeletal:     Lumbar back: Positive left straight leg raise test. Negative right straight leg raise test.     Comments: Mild tenderness of the right trapezius, just off midline.  Skin:    General: Skin is warm and dry.  Neurological:     Mental Status: He is alert and oriented to person, place, and time.     Gait: Gait is intact.  Psychiatric:        Mood and Affect: Mood and affect normal.     Lab Results  Component Value Date   HGBA1C 8.7 (A) 06/05/2023    Recent Labs     Component Value Date/Time   NA 137 02/28/2017 1358   NA 140 04/07/2014 1614   K 4.0 02/28/2017 1358   K 3.8 04/07/2014 1614   CL 104 02/28/2017 1358   CL 108 (H) 04/07/2014 1614   CO2 26 02/28/2017 1358   CO2 22 04/07/2014 1614   GLUCOSE 179 (H) 02/28/2017 1358   GLUCOSE 146 (H) 04/07/2014 1614   BUN 12 02/28/2017 1358   BUN 16 04/07/2014 1614   CREATININE 0.80 02/28/2017 1358   CREATININE 1.51 (H) 04/07/2014 1614   CALCIUM  8.7 (L) 02/28/2017 1358   CALCIUM  8.8 04/07/2014 1614   PROT 7.4 02/28/2017 1358   PROT 8.5 (H) 04/07/2014 1614   ALBUMIN 3.9 02/28/2017 1358   ALBUMIN 3.7 04/07/2014  1614   AST 24 02/28/2017 1358   AST 33 04/07/2014 1614   ALT 32 02/28/2017 1358   ALT 65 (H) 04/07/2014 1614   ALKPHOS 98 02/28/2017 1358   ALKPHOS 118 (H) 04/07/2014 1614   BILITOT 0.9 02/28/2017 1358   BILITOT 0.9 04/07/2014 1614   GFRNONAA >60 02/28/2017 1358   GFRNONAA 54 (L) 04/07/2014 1614   GFRNONAA >60 07/01/2013 1441   GFRAA >60 02/28/2017 1358   GFRAA >60 04/07/2014 1614   GFRAA >60 07/01/2013 1441    Lab Results  Component Value Date   WBC 10.4 02/28/2017   HGB 14.6 02/28/2017   HCT 40.9 02/28/2017   MCV 87.2 02/28/2017   PLT 220 02/28/2017   Lab Results  Component Value Date   HGBA1C 8.7 (A) 06/05/2023   No results found for: CHOL, HDL, LDLCALC, LDLDIRECT, TRIG, CHOLHDL No results found for: TSH   Assessment and Plan:  Type 2 diabetes mellitus with hyperlipidemia (HCC) Assessment & Plan: A1c 8.7% today, inadequately controlled.  Will try to add Mounjaro  if approved by insurance.  Otherwise continue metformin  as prescribed.  Refill glucose test strips.  Check baseline labs and microalbumin  Orders: -     Glucose Blood; Use as instructed  Dispense: 100 each; Refill: 5 -     POCT glycosylated hemoglobin (Hb A1C) -     CBC with Differential/Platelet -     Comprehensive metabolic panel -     TSH -     Lipid panel -     Microalbumin / creatinine urine ratio -     Tirzepatide ; Inject 2.5 mg into the skin once a week.  Dispense: 2 mL; Refill: 0  Microalbuminuria due to type 2 diabetes mellitus (HCC) Assessment & Plan: Repeat micral  today  Orders: -     Microalbumin / creatinine urine ratio  Sciatica, left side Assessment & Plan: Treat with meloxicam  initially.  Advised not to use with other NSAIDs.  Patient has also used gabapentin  before, but not sure about its effectiveness.  Probably no need for SI joint injection at this time, but will follow clinically.  Orders: -     Meloxicam ; Take 1 tablet (15 mg total) by mouth daily.  Dispense:  15 tablet; Refill: 0  Class 3 severe obesity with serious comorbidity and body mass index (BMI) of 45.0 to 49.9 in adult, unspecified obesity type (HCC) Assessment & Plan: GLP-1 RA likely to have significant benefits across his metabolic comorbidities.  Hopefully we can get one of these medications approved for him.  Orders: -     CBC with Differential/Platelet -     Comprehensive metabolic panel -     TSH -     Lipid panel -     Microalbumin / creatinine urine ratio  Metabolic syndrome Assessment & Plan: Plan as above   Chronic idiopathic gout involving toe without tophus, unspecified laterality Assessment & Plan: Check uric acid along with other blood work today.  Continue allopurinol  100 mg daily for now.  Orders: -     Uric acid     Return in about 4 weeks (around 07/03/2023) for OV f/u DM2.    Rolan Hoyle, PA-C, DMSc, Nutritionist [facility] [phone]

## 2023-06-05 NOTE — Assessment & Plan Note (Signed)
 GLP-1 RA likely to have significant benefits across his metabolic comorbidities.  Hopefully we can get one of these medications approved for him.

## 2023-06-05 NOTE — Assessment & Plan Note (Signed)
 Check uric acid along with other blood work today.  Continue allopurinol 100 mg daily for now.

## 2023-06-05 NOTE — Telephone Encounter (Signed)
 PA completed  KP

## 2023-06-05 NOTE — Assessment & Plan Note (Signed)
 Repeat micral today

## 2023-06-05 NOTE — Telephone Encounter (Signed)
 PA completed waiting on insurance approval.  Key: [id number]  KP

## 2023-06-05 NOTE — Assessment & Plan Note (Signed)
 Treat with meloxicam  initially.  Advised not to use with other NSAIDs.  Patient has also used gabapentin  before, but not sure about its effectiveness.  Probably no need for SI joint injection at this time, but will follow clinically.

## 2023-06-05 NOTE — Assessment & Plan Note (Signed)
 A1c 8.7% today, inadequately controlled.  Will try to add Mounjaro if approved by insurance.  Otherwise continue metformin as prescribed.  Refill glucose test strips.  Check baseline labs and microalbumin

## 2023-06-05 NOTE — Assessment & Plan Note (Signed)
 Plan as above.

## 2023-06-05 NOTE — Telephone Encounter (Signed)
 Please initiate prior auth for Mounjaro for diabetes type 2 with hyperlipidemia.  Also has class 3 obesity and metabolic syndrome

## 2023-06-06 DIAGNOSIS — E1129 Type 2 diabetes mellitus with other diabetic kidney complication: Secondary | ICD-10-CM | POA: Diagnosis not present

## 2023-06-06 DIAGNOSIS — E66813 Obesity, class 3: Secondary | ICD-10-CM | POA: Diagnosis not present

## 2023-06-06 DIAGNOSIS — R809 Proteinuria, unspecified: Secondary | ICD-10-CM | POA: Diagnosis not present

## 2023-06-06 DIAGNOSIS — Z6841 Body Mass Index (BMI) 40.0 and over, adult: Secondary | ICD-10-CM | POA: Diagnosis not present

## 2023-06-06 DIAGNOSIS — M1A0790 Idiopathic chronic gout, unspecified ankle and foot, without tophus (tophi): Secondary | ICD-10-CM | POA: Diagnosis not present

## 2023-06-06 DIAGNOSIS — E785 Hyperlipidemia, unspecified: Secondary | ICD-10-CM | POA: Diagnosis not present

## 2023-06-06 DIAGNOSIS — E1169 Type 2 diabetes mellitus with other specified complication: Secondary | ICD-10-CM | POA: Diagnosis not present

## 2023-06-07 LAB — LIPID PANEL
Chol/HDL Ratio: 3.7 {ratio} (ref 0.0–5.0)
Cholesterol, Total: 129 mg/dL (ref 100–199)
HDL: 35 mg/dL — ABNORMAL LOW (ref >39)
LDL Chol Calc (NIH): 57 mg/dL (ref 0–99)
Triglycerides: 229 mg/dL — ABNORMAL HIGH (ref 0–149)
VLDL Cholesterol Cal: 37 mg/dL (ref 5–40)

## 2023-06-07 LAB — CBC WITH DIFFERENTIAL/PLATELET
Basophils Absolute: 0.1 10*3/uL (ref 0.0–0.2)
Basos: 1 %
EOS (ABSOLUTE): 0.2 10*3/uL (ref 0.0–0.4)
Eos: 2 %
Hematocrit: 40.2 % (ref 37.5–51.0)
Hemoglobin: 13.9 g/dL (ref 13.0–17.7)
Immature Grans (Abs): 0 10*3/uL (ref 0.0–0.1)
Immature Granulocytes: 0 %
Lymphocytes Absolute: 3.2 10*3/uL — ABNORMAL HIGH (ref 0.7–3.1)
Lymphs: 28 %
MCH: 30.8 pg (ref 26.6–33.0)
MCHC: 34.6 g/dL (ref 31.5–35.7)
MCV: 89 fL (ref 79–97)
Monocytes Absolute: 0.7 10*3/uL (ref 0.1–0.9)
Monocytes: 6 %
Neutrophils Absolute: 7.3 10*3/uL — ABNORMAL HIGH (ref 1.4–7.0)
Neutrophils: 63 %
Platelets: 280 10*3/uL (ref 150–450)
RBC: 4.52 x10E6/uL (ref 4.14–5.80)
RDW: 12.5 % (ref 11.6–15.4)
WBC: 11.6 10*3/uL — ABNORMAL HIGH (ref 3.4–10.8)

## 2023-06-07 LAB — COMPREHENSIVE METABOLIC PANEL
ALT: 32 [IU]/L (ref 0–44)
AST: 23 [IU]/L (ref 0–40)
Albumin: 4.1 g/dL (ref 3.8–4.9)
Alkaline Phosphatase: 102 [IU]/L (ref 44–121)
BUN/Creatinine Ratio: 20 (ref 9–20)
BUN: 13 mg/dL (ref 6–24)
Bilirubin Total: 0.6 mg/dL (ref 0.0–1.2)
CO2: 23 mmol/L (ref 20–29)
Calcium: 8.7 mg/dL (ref 8.7–10.2)
Chloride: 101 mmol/L (ref 96–106)
Creatinine, Ser: 0.66 mg/dL — ABNORMAL LOW (ref 0.76–1.27)
Globulin, Total: 2.8 g/dL (ref 1.5–4.5)
Glucose: 227 mg/dL — ABNORMAL HIGH (ref 70–99)
Potassium: 4.2 mmol/L (ref 3.5–5.2)
Sodium: 139 mmol/L (ref 134–144)
Total Protein: 6.9 g/dL (ref 6.0–8.5)
eGFR: 112 mL/min/{1.73_m2} (ref >59)

## 2023-06-07 LAB — MICROALBUMIN / CREATININE URINE RATIO
Creatinine, Urine: 109.1 mg/dL
Microalb/Creat Ratio: 76 mg/g{creat} — ABNORMAL HIGH (ref 0–29)
Microalbumin, Urine: 83.2 ug/mL

## 2023-06-07 LAB — URIC ACID: Uric Acid: 3.3 mg/dL — ABNORMAL LOW (ref 3.8–8.4)

## 2023-06-07 LAB — TSH: TSH: 1.77 u[IU]/mL (ref 0.450–4.500)

## 2023-06-09 NOTE — Telephone Encounter (Signed)
 Denied   Letter in your bin.  KP

## 2023-06-16 ENCOUNTER — Other Ambulatory Visit: Payer: Self-pay | Admitting: Physician Assistant

## 2023-06-16 DIAGNOSIS — E1169 Type 2 diabetes mellitus with other specified complication: Secondary | ICD-10-CM

## 2023-06-16 MED ORDER — RYBELSUS 7 MG PO TABS: 7.0000 mg | ORAL_TABLET | Freq: Every day | ORAL | 1 refills | Status: DC

## 2023-06-16 MED ORDER — RYBELSUS 3 MG PO TABS: 3.0000 mg | ORAL_TABLET | Freq: Every day | ORAL | Status: DC

## 2023-06-16 NOTE — Progress Notes (Signed)
Sample for Rybelsus 3 mg placed at front desk.

## 2023-06-16 NOTE — Progress Notes (Signed)
Mounjaro denied until patient tries all 3 preferred products (Trulicity, Victoza, Rybelsus).  Sending prescription for Rybelsus 7 mg to replace Mounjaro. Also we have sample of the 3 mg which I have set aside for him.

## 2023-07-01 NOTE — Telephone Encounter (Signed)
 Appt?  KP

## 2023-07-03 ENCOUNTER — Ambulatory Visit (INDEPENDENT_AMBULATORY_CARE_PROVIDER_SITE_OTHER): Admitting: Physician Assistant

## 2023-07-03 ENCOUNTER — Encounter: Payer: Self-pay | Admitting: Physician Assistant

## 2023-07-03 ENCOUNTER — Ambulatory Visit: Payer: 59 | Admitting: Physician Assistant

## 2023-07-03 VITALS — BP 146/90 | HR 68 | Temp 97.6°F | Ht 72.0 in | Wt 334.0 lb

## 2023-07-03 DIAGNOSIS — Z0100 Encounter for examination of eyes and vision without abnormal findings: Secondary | ICD-10-CM | POA: Diagnosis not present

## 2023-07-03 DIAGNOSIS — E1169 Type 2 diabetes mellitus with other specified complication: Secondary | ICD-10-CM | POA: Diagnosis not present

## 2023-07-03 DIAGNOSIS — E785 Hyperlipidemia, unspecified: Secondary | ICD-10-CM | POA: Diagnosis not present

## 2023-07-03 DIAGNOSIS — D352 Benign neoplasm of pituitary gland: Secondary | ICD-10-CM

## 2023-07-03 DIAGNOSIS — E229 Hyperfunction of pituitary gland, unspecified: Secondary | ICD-10-CM

## 2023-07-03 DIAGNOSIS — H524 Presbyopia: Secondary | ICD-10-CM | POA: Diagnosis not present

## 2023-07-03 DIAGNOSIS — Z7984 Long term (current) use of oral hypoglycemic drugs: Secondary | ICD-10-CM

## 2023-07-03 DIAGNOSIS — M5432 Sciatica, left side: Secondary | ICD-10-CM | POA: Diagnosis not present

## 2023-07-03 LAB — HM DIABETES EYE EXAM

## 2023-07-03 MED ORDER — MELOXICAM 15 MG PO TABS: 15.0000 mg | ORAL_TABLET | Freq: Every day | ORAL | 0 refills | Status: DC

## 2023-07-03 NOTE — Assessment & Plan Note (Signed)
 Patient is willing to return to specialist care for updated labs, imaging, and surveillance. Submitting referral to endocrinology.

## 2023-07-03 NOTE — Assessment & Plan Note (Signed)
 Patient seems to be doing well with Rybelsus 3 mg. Advised to pick up the rx for 7 mg from pharmacy, sent about 2 wk ago. He is to let me know if any issues.

## 2023-07-03 NOTE — Progress Notes (Signed)
Date:  07/03/2023   Name:  Jacob [name]   DOB:  12-Nov-1970   MRN:  [id number]   Chief Complaint: Sciatica (7 on pain scale, X3 weeks, getting worse, burning, left side goes all the way to foot, knees getting weak) and Diabetes (No side effects/)  HPI Tyshan presents for 93-month follow up on diabetes after starting Rybelsus 3 mg which he has taken with good compliance and tolerance. A1c 8.7% last visit. Insurance would not cover injectables. Patient would be interested in dose increase. Weight is down a modest 3 lb since last visit.   His primary concern today is actually acute-on-chronic low back pain with left-sided sciatica. Current flare started 3wk ago, gradually worsening, no inciting factor that he can recall. When it flares, it is always left-sided. Previously he has found significant relief with injections for this problem (probably SI joint). Currently using meloxicam once daily.   Last visit we had also briefly touched on his pituitary adenoma which has affected prolactin and testosterone levels. It has been about 9 years since last imaging.     Medication list has been reviewed and updated.  Current Meds  Medication Sig   allopurinol (ZYLOPRIM) 100 MG tablet Take 100 mg by mouth at bedtime.    amLODipine (NORVASC) 5 MG tablet Take by mouth.   Ascorbic Acid (VITA-C PO) Take by mouth.   aspirin EC 81 MG tablet Take 81 mg by mouth daily.   atorvastatin (LIPITOR) 20 MG tablet    FLUoxetine (PROZAC) 20 MG capsule Take 20 mg by mouth at bedtime.    fluticasone (FLONASE) 50 MCG/ACT nasal spray Place into both nostrils daily.   glucose blood test strip Use as instructed   lisinopril (ZESTRIL) 20 MG tablet Take 20 mg by mouth every morning.   metFORMIN (GLUMETZA) 500 MG (MOD) 24 hr tablet Take 1,500 mg at bedtime by mouth.    ONETOUCH DELICA LANCETS FINE MISC    Semaglutide (RYBELSUS) 7 MG TABS Take 1 tablet (7 mg total) by mouth daily.   Turmeric (QC TUMERIC COMPLEX PO) Take  by mouth.   [DISCONTINUED] meloxicam (MOBIC) 15 MG tablet Take 1 tablet (15 mg total) by mouth daily.   [DISCONTINUED] metFORMIN (GLUCOPHAGE-XR) 500 MG 24 hr tablet Take by mouth.   [DISCONTINUED] Semaglutide (RYBELSUS) 3 MG TABS Take 1 tablet (3 mg total) by mouth daily. Take first thing in the morning with 4 oz water. Wait at least 30 minutes before any food or other medications.     Review of Systems  Patient Active Problem List   Diagnosis Date Noted   Metabolic syndrome 06/05/2023   Class 3 severe obesity with serious comorbidity and body mass index (BMI) of 45.0 to 49.9 in adult [facility]) 06/05/2023   Microalbuminuria due to type 2 diabetes mellitus (HCC) 06/05/2023   Sciatica, left side 06/05/2023   Chronic idiopathic gout involving toe without tophus 08/21/2021   Depression 08/21/2021   Arthritis of facet joint of lumbar spine 10/19/2019   Chondromalacia of left knee 05/29/2016   Complex tear of medial meniscus of left knee as current injury 04/12/2016   Non-pressure chronic ulcer of left ankle, limited to breakdown of skin (HCC) 04/25/2015   Type 2 diabetes mellitus with hyperlipidemia (HCC) 05/07/2013   Hearing loss 05/07/2013   Primary hypertension 02/22/2008   OSA (obstructive sleep apnea) 02/22/2008   Pituitary microadenoma with hyperprolactinemia (HCC) 02/22/2008    No Known Allergies  Immunization History  Administered Date(s) Administered  Influenza Inj Mdck Quad Pf 01/28/2020, 02/19/2021   Influenza, Seasonal, Injecte, Preservative Fre 05/07/2013   Influenza,inj,Quad PF,6+ Mos 04/04/2017, 06/26/2018, 04/15/2019   Influenza-Unspecified 04/23/1989, 02/28/2016, 04/24/2023   PFIZER(Purple Top)SARS-COV-2 Vaccination 05/13/2019, 06/03/2019, 03/24/2020   PNEUMOCOCCAL CONJUGATE-20 02/19/2021   Pneumococcal Polysaccharide-23 06/18/2016   Tdap 08/03/2015, 09/14/2020    Past Surgical History:  Procedure Laterality Date   CHOLECYSTECTOMY     KNEE ARTHROSCOPY Right  08/23/2015   Procedure: ARTHROSCOPY KNEE, partial medial and lateral menisectomy, medial and lateral chondolar microfracture.;  Surgeon: Erin Sons, MD;  Location: [facility] ORS;  Service: Orthopedics;  Laterality: Right;   KNEE ARTHROSCOPY Left 02/21/2016   Procedure: ARTHROSCOPY KNEE;  Surgeon: Erin Sons, MD;  Location: [facility] ORS;  Service: Orthopedics;  Laterality: Left;   TONSILLECTOMY     TONSILLECTOMY AND ADENOIDECTOMY     UVULOPALATOPHARYNGOPLASTY     WISDOM TOOTH EXTRACTION      Social History   Tobacco Use   Smoking status: Former    Current packs/day: 0.00    Average packs/day: 1.3 packs/day for 15.0 years (20.0 ttl pk-yrs)    Types: Cigarettes    Start date: 04/30/1983    Quit date: 04/29/1993    Years since quitting: 30.1   Smokeless tobacco: Former    Types: Chew    Quit date: 05/12/2015  Vaping Use   Vaping status: Never Used  Substance Use Topics   Alcohol use: Yes    Comment: rarely   Drug use: No    Family History  Problem Relation Age of Onset   Thyroid disease Mother    Hypertension Mother    Lung cancer Father    Cancer Father    Thyroid disease Sister    Diabetes Maternal Grandmother    Heart disease Paternal Grandmother         07/03/2023    8:26 AM 06/05/2023    1:27 PM  GAD 7 : Generalized Anxiety Score  Nervous, Anxious, on Edge 0 0  Control/stop worrying 0 0  Worry too much - different things 0 0  Trouble relaxing 0 0  Restless 0 0  Easily annoyed or irritable 0 0  Afraid - awful might happen 0 0  Total GAD 7 Score 0 0  Anxiety Difficulty Not difficult at all Not difficult at all       07/03/2023    8:26 AM 06/05/2023    1:27 PM  Depression screen PHQ 2/9  Decreased Interest 0 0  Down, Depressed, Hopeless 0 0  PHQ - 2 Score 0 0    BP Readings from Last 3 Encounters:  07/03/23 (!) 146/90  06/05/23 138/78  01/18/21 (!) 160/75    Wt Readings from Last 3 Encounters:  07/03/23 (!) 334 lb (151.5 kg)  06/05/23 (!) 337 lb (152.9 kg)   01/18/21 (!) 350 lb (158.8 kg)    BP (!) 146/90   Pulse 68   Temp 97.6 F (36.4 C)   Ht 6' (1.829 m)   Wt (!) 334 lb (151.5 kg)   SpO2 96%   BMI 45.30 kg/m   Physical Exam Vitals and nursing note reviewed.  Constitutional:      Appearance: Normal appearance. He is obese.  Cardiovascular:     Rate and Rhythm: Normal rate.  Pulmonary:     Effort: Pulmonary effort is normal.  Abdominal:     General: There is no distension.  Musculoskeletal:     Lumbar back: Positive left straight leg raise test. Negative right straight  leg raise test.  Skin:    General: Skin is warm and dry.  Neurological:     Mental Status: He is alert and oriented to person, place, and time.     Gait: Gait is intact.  Psychiatric:        Mood and Affect: Mood and affect normal.     Recent Labs     Component Value Date/Time   NA 139 06/06/2023 1451   NA 140 04/07/2014 1614   K 4.2 06/06/2023 1451   K 3.8 04/07/2014 1614   CL 101 06/06/2023 1451   CL 108 (H) 04/07/2014 1614   CO2 23 06/06/2023 1451   CO2 22 04/07/2014 1614   GLUCOSE 227 (H) 06/06/2023 1451   GLUCOSE 179 (H) 02/28/2017 1358   GLUCOSE 146 (H) 04/07/2014 1614   BUN 13 06/06/2023 1451   BUN 16 04/07/2014 1614   CREATININE 0.66 (L) 06/06/2023 1451   CREATININE 1.51 (H) 04/07/2014 1614   CALCIUM 8.7 06/06/2023 1451   CALCIUM 8.8 04/07/2014 1614   PROT 6.9 06/06/2023 1451   PROT 8.5 (H) 04/07/2014 1614   ALBUMIN 4.1 06/06/2023 1451   ALBUMIN 3.7 04/07/2014 1614   AST 23 06/06/2023 1451   AST 33 04/07/2014 1614   ALT 32 06/06/2023 1451   ALT 65 (H) 04/07/2014 1614   ALKPHOS 102 06/06/2023 1451   ALKPHOS 118 (H) 04/07/2014 1614   BILITOT 0.6 06/06/2023 1451   BILITOT 0.9 04/07/2014 1614   GFRNONAA >60 02/28/2017 1358   GFRNONAA 54 (L) 04/07/2014 1614   GFRNONAA >60 07/01/2013 1441   GFRAA >60 02/28/2017 1358   GFRAA >60 04/07/2014 1614   GFRAA >60 07/01/2013 1441    Lab Results  Component Value Date   WBC 11.6 (H)  06/06/2023   HGB 13.9 06/06/2023   HCT 40.2 06/06/2023   MCV 89 06/06/2023   PLT 280 06/06/2023   Lab Results  Component Value Date   HGBA1C 8.7 (A) 06/05/2023   Lab Results  Component Value Date   CHOL 129 06/06/2023   HDL 35 (L) 06/06/2023   LDLCALC 57 06/06/2023   TRIG 229 (H) 06/06/2023   CHOLHDL 3.7 06/06/2023   Lab Results  Component Value Date   TSH 1.770 06/06/2023     Assessment and Plan:  Sciatica, left side Assessment & Plan: For now, refill meloxicam and continue this. We discussed option for prednisone or gabapentin, but he would like to wait until consulting with my colleague Dr. Joseph Berkshire, with whom I am setting him up for  next week for consideration of steroid injection for sciatica.  Orders: -     Meloxicam; Take 1 tablet (15 mg total) by mouth daily.  Dispense: 30 tablet; Refill: 0  Type 2 diabetes mellitus with hyperlipidemia (HCC) Assessment & Plan: Patient seems to be doing well with Rybelsus 3 mg. Advised to pick up the rx for 7 mg from pharmacy, sent about 2 wk ago. He is to let me know if any issues.    Pituitary microadenoma with hyperprolactinemia [facility]) Assessment & Plan: Patient is willing to return to specialist care for updated labs, imaging, and surveillance. Submitting referral to endocrinology.   Orders: -     Ambulatory referral to Endocrinology      Return in about 2 months (around 09/02/2023) for May 6 or later, OV f/u chronic conditions.    Alvester Morin, PA-C, DMSc, Nutritionist [facility] [phone]  [phone]   

## 2023-07-03 NOTE — Assessment & Plan Note (Signed)
 For now, refill meloxicam and continue this. We discussed option for prednisone or gabapentin, but he would like to wait until consulting with my colleague Dr. Joseph Berkshire, with whom I am setting him up for  next week for consideration of steroid injection for sciatica.

## 2023-07-07 ENCOUNTER — Ambulatory Visit (INDEPENDENT_AMBULATORY_CARE_PROVIDER_SITE_OTHER): Admitting: Family Medicine

## 2023-07-07 ENCOUNTER — Other Ambulatory Visit (INDEPENDENT_AMBULATORY_CARE_PROVIDER_SITE_OTHER): Payer: Self-pay | Admitting: Radiology

## 2023-07-07 ENCOUNTER — Encounter: Payer: Self-pay | Admitting: Family Medicine

## 2023-07-07 VITALS — BP 160/104 | HR 88 | Ht 72.0 in | Wt 330.0 lb

## 2023-07-07 DIAGNOSIS — M533 Sacrococcygeal disorders, not elsewhere classified: Secondary | ICD-10-CM | POA: Insufficient documentation

## 2023-07-07 DIAGNOSIS — M5432 Sciatica, left side: Secondary | ICD-10-CM

## 2023-07-07 MED ORDER — GABAPENTIN 300 MG PO CAPS
300.0000 mg | ORAL_CAPSULE | Freq: Every day | ORAL | 0 refills | Status: DC
Start: 2023-07-07 — End: 2023-07-07

## 2023-07-07 MED ORDER — TRIAMCINOLONE ACETONIDE 40 MG/ML IJ SUSP
80.0000 mg | Freq: Once | INTRAMUSCULAR | Status: AC
  Administered 2023-07-07: 80 mg via INTRAMUSCULAR

## 2023-07-07 MED ORDER — GABAPENTIN 300 MG PO CAPS: 300.0000 mg | ORAL_CAPSULE | Freq: Every day | ORAL | 0 refills | Status: DC

## 2023-07-07 NOTE — Progress Notes (Signed)
 Primary Care / Sports Medicine Office Visit  Patient Information:  Patient ID: Jacob [name], male DOB: [birth year] Age: 53 y.o. MRN: [id number]   Jacob [name] is a pleasant 53 y.o. male presenting with the following:  Chief Complaint  Patient presents with   Sciatica    Patient presents today for a corticosteroid injection in his left SI joint. He has been experiencing posterior left leg pain with burning sensation and weakness x 4 weeks that goes down his entire left leg and into his foot.     Vitals:   07/07/23 1026  BP: (!) 160/104  Pulse: 88  SpO2: 94%   Vitals:   07/07/23 1026  Weight: (!) 330 lb (149.7 kg)  Height: 6' (1.829 m)   Body mass index is 44.76 kg/m.  No results found.   Independent interpretation of notes and tests performed by another provider:   None  Procedures performed:   Procedure:  Injection of left SI joint under ultrasound guidance. Ultrasound guidance utilized for out-of-plane approach, medial to lateral, of left SI joint Samsung HS60 device utilized with permanent recording / reporting. Verbal informed consent obtained and verified. Skin prepped in a sterile fashion. Ethyl chloride for topical local analgesia.  Completed without difficulty and tolerated well. Medication: triamcinolone acetonide 40 mg/mL suspension for injection 2 mL total and 4 mL lidocaine 1% without epinephrine utilized for needle placement anesthetic Advised to contact for fevers/chills, erythema, induration, drainage, or persistent bleeding.   Pertinent History, Exam, Impression, and Recommendations:   Problem List Items Addressed This Visit     Sacroiliac joint dysfunction of left side - Primary   History of Present Illness The patient presents with left-sided sacroiliac joint pain radiating down the leg.  This is a chronic issue.  He has experienced left-sided sacroiliac (SI) joint pain for several years. Multiple injections have been administered  in the past, typically providing relief lasting approximately 14 to 16 months. The last injection was roughly around that time ago.   Recently, he has noticed a change in his symptoms, with pain radiating down to the bottom of his left foot, which is a new development. Previously, the pain would stop at a certain point on his leg. No pain extends to his toes.   He is currently prescribed meloxicam but has not taken it today. It has not been effective in managing his symptoms thus far.  He has a history of diabetes, with a recent A1c level of 8.7. He has started on Rybelsus but is experiencing issues with insurance coverage and pharmacy availability, having only a week's worth of samples left.  Physical Exam PALPATION: Maximal tenderness to palpation in the left sacroiliac joint region. SPECIAL TESTS: Positive modified straight leg raise on the left.  Results LABS Hemoglobin A1c: 8.7%  Assessment and Plan Left SI Joint Pain with sciatic features Chronic pain, previously managed with injections lasting 14-16 months. Current pain extends down the leg to the foot, which is a new symptom. Positive modified straight leg raise suggesting possible nerve involvement. -Plan ultrasound-guided injections today targeting the SI joint. -Start Gabapentin for nerve-related pain nightly, 3 mg x 30 days.      Relevant Orders   [place] LIMITED JOINT SPACE STRUCTURES LOW LEFT   Sciatica, left side   See additional assessment(s) for plan details.  Lumbosacral radiculopathy versus isolated sciatic nerve irritation distal to the lumbar spine Discussed possibility of low back issue causing referred pain to SI joint and down  the leg. -If no improvement following injections, consider imaging of the low back. -Consider short course of oral steroids if no improvement post-injection.      Relevant Medications   gabapentin (NEURONTIN) 300 MG capsule     Orders & Medications Medications:  Meds ordered this  encounter  Medications   DISCONTD: gabapentin (NEURONTIN) 300 MG capsule    Sig: Take 1 capsule (300 mg total) by mouth at bedtime.    Dispense:  30 capsule    Refill:  0   gabapentin (NEURONTIN) 300 MG capsule    Sig: Take 1 capsule (300 mg total) by mouth at bedtime.    Dispense:  30 capsule    Refill:  0   Orders Placed This Encounter  Procedures   [place] LIMITED JOINT SPACE STRUCTURES LOW LEFT     No follow-ups on file.     Jerrol Banana, MD, [facility]   Primary Care Sports Medicine Primary Care and Sports Medicine at [facility]

## 2023-07-07 NOTE — Patient Instructions (Signed)
 Patient Plan  Left Sacroiliac Joint Dysfunction with Sciatic Features:  1. Receive an ultrasound-guided injection targeting the left SI joint today. 2. Start taking Gabapentin 300 mg capsule nightly for nerve-related pain, for 30 days. 3. Monitor pain levels and note any changes, especially if pain persists or worsens.  Future Considerations:  1. If there is no improvement after the injection, imaging of the lower back may be needed. 2. A short course of oral steroids may be considered if there is no improvement post-injection.  Additional Notes:  - Continue monitoring blood sugar levels due to diabetes, with a recent A1c of 8.7. - Address insurance and pharmacy issues for Rybelsus, as only a week's supply remains.  Red Flags:  - Contact your healthcare provider if pain intensifies, spreads to new areas, or if you experience any new symptoms.

## 2023-07-07 NOTE — Assessment & Plan Note (Signed)
 See additional assessment(s) for plan details.  Lumbosacral radiculopathy versus isolated sciatic nerve irritation distal to the lumbar spine Discussed possibility of low back issue causing referred pain to SI joint and down the leg. -If no improvement following injections, consider imaging of the low back. -Consider short course of oral steroids if no improvement post-injection.

## 2023-07-07 NOTE — Assessment & Plan Note (Signed)
 History of Present Illness The patient presents with left-sided sacroiliac joint pain radiating down the leg.  This is a chronic issue.  He has experienced left-sided sacroiliac (SI) joint pain for several years. Multiple injections have been administered in the past, typically providing relief lasting approximately 14 to 16 months. The last injection was roughly around that time ago.   Recently, he has noticed a change in his symptoms, with pain radiating down to the bottom of his left foot, which is a new development. Previously, the pain would stop at a certain point on his leg. No pain extends to his toes.   He is currently prescribed meloxicam but has not taken it today. It has not been effective in managing his symptoms thus far.  He has a history of diabetes, with a recent A1c level of 8.7. He has started on Rybelsus but is experiencing issues with insurance coverage and pharmacy availability, having only a week's worth of samples left.  Physical Exam PALPATION: Maximal tenderness to palpation in the left sacroiliac joint region. SPECIAL TESTS: Positive modified straight leg raise on the left.  Results LABS Hemoglobin A1c: 8.7%  Assessment and Plan Left SI Joint Pain with sciatic features Chronic pain, previously managed with injections lasting 14-16 months. Current pain extends down the leg to the foot, which is a new symptom. Positive modified straight leg raise suggesting possible nerve involvement. -Plan ultrasound-guided injections today targeting the SI joint. -Start Gabapentin for nerve-related pain nightly, 3 mg x 30 days.

## 2023-07-09 ENCOUNTER — Ambulatory Visit: Payer: 59 | Admitting: Physician Assistant

## 2023-09-02 ENCOUNTER — Ambulatory Visit (INDEPENDENT_AMBULATORY_CARE_PROVIDER_SITE_OTHER): Admitting: Physician Assistant

## 2023-09-02 ENCOUNTER — Encounter: Payer: Self-pay | Admitting: Physician Assistant

## 2023-09-02 VITALS — BP 144/80 | HR 69 | Temp 97.5°F | Ht 72.0 in | Wt 323.0 lb

## 2023-09-02 DIAGNOSIS — E66813 Obesity, class 3: Secondary | ICD-10-CM | POA: Diagnosis not present

## 2023-09-02 DIAGNOSIS — E785 Hyperlipidemia, unspecified: Secondary | ICD-10-CM | POA: Diagnosis not present

## 2023-09-02 DIAGNOSIS — I10 Essential (primary) hypertension: Secondary | ICD-10-CM

## 2023-09-02 DIAGNOSIS — Z6841 Body Mass Index (BMI) 40.0 and over, adult: Secondary | ICD-10-CM | POA: Diagnosis not present

## 2023-09-02 DIAGNOSIS — Z7984 Long term (current) use of oral hypoglycemic drugs: Secondary | ICD-10-CM | POA: Diagnosis not present

## 2023-09-02 DIAGNOSIS — E1169 Type 2 diabetes mellitus with other specified complication: Secondary | ICD-10-CM | POA: Diagnosis not present

## 2023-09-02 LAB — POCT GLYCOSYLATED HEMOGLOBIN (HGB A1C): Hemoglobin A1C: 7.6 % — AB (ref 4.0–5.6)

## 2023-09-02 NOTE — Progress Notes (Signed)
 Date:  09/02/2023   Name:  Jacob [name]   DOB:  Sep 11, 1970   MRN:  [id number]   Chief Complaint: Diabetes  HPI Imad presents for 73-month follow-up on chronic conditions including HTN, DM2, metabolic syndrome overall doing well since our last visit when he was prescribed Rybelsus .  He is currently taking Rybelsus  7 mg daily in addition to his metformin 1500 mg daily.  Tolerating both medication well.   He has not been monitoring blood pressure at home, and did not take his medications today stating he was not sure if they would interfere with any lab work I wanted to do.  Overall he states he is doing well.  Recently saw my sports medicine colleague Dr. Rebekah Canada for SI joint injection on 07/07/2023 which has resulted in significant improvement of symptoms and patient is grateful for this.  Weight continues to decline, down 14 pounds over the last 3 months.  He has a new patient appointment with [facility] endocrinology 09/15/2023 for consult on pituitary adenoma which has affected prolactin and testosterone levels but was previously determined not to be a candidate for surgical intervention. It has been about 9 years since last imaging.    Medication list has been reviewed and updated.  Current Meds  Medication Sig   allopurinol (ZYLOPRIM) 100 MG tablet Take 100 mg by mouth at bedtime.    amLODipine (NORVASC) 5 MG tablet Take by mouth.   Ascorbic Acid (VITA-C PO) Take by mouth.   aspirin  EC 81 MG tablet Take 81 mg by mouth daily.   atorvastatin (LIPITOR) 20 MG tablet    FLUoxetine (PROZAC) 20 MG capsule Take 20 mg by mouth at bedtime.    fluticasone (FLONASE) 50 MCG/ACT nasal spray Place into both nostrils daily.   glucose blood test strip Use as instructed   lisinopril (ZESTRIL) 20 MG tablet Take 20 mg by mouth every morning.   metFORMIN (GLUMETZA) 500 MG (MOD) 24 hr tablet Take 1,500 mg at bedtime by mouth.    ONETOUCH DELICA LANCETS FINE MISC    Semaglutide  (RYBELSUS ) 7 MG  TABS Take 1 tablet (7 mg total) by mouth daily.   Turmeric (QC TUMERIC COMPLEX PO) Take by mouth.   [DISCONTINUED] gabapentin  (NEURONTIN ) 300 MG capsule Take 1 capsule (300 mg total) by mouth at bedtime.     Review of Systems  Patient Active Problem List   Diagnosis Date Noted   Sacroiliac joint dysfunction of left side 07/07/2023   Metabolic syndrome 06/05/2023   Class 3 severe obesity with serious comorbidity in adult 06/05/2023   Microalbuminuria due to type 2 diabetes mellitus (HCC) 06/05/2023   Sciatica, left side 06/05/2023   Chronic idiopathic gout involving toe without tophus 08/21/2021   Depression 08/21/2021   Arthritis of facet joint of lumbar spine 10/19/2019   Chondromalacia of left knee 05/29/2016   Complex tear of medial meniscus of left knee as current injury 04/12/2016   Non-pressure chronic ulcer of left ankle, limited to breakdown of skin (HCC) 04/25/2015   Type 2 diabetes mellitus with hyperlipidemia (HCC) 05/07/2013   Hearing loss 05/07/2013   Primary hypertension 02/22/2008   OSA (obstructive sleep apnea) 02/22/2008   Pituitary microadenoma with hyperprolactinemia (HCC) 02/22/2008    No Known Allergies  Immunization History  Administered Date(s) Administered   Influenza Inj Mdck Quad Pf 01/28/2020, 02/19/2021   Influenza, Seasonal, Injecte, Preservative Fre 05/07/2013   Influenza,inj,Quad PF,6+ Mos 04/04/2017, 06/26/2018, 04/15/2019   Influenza-Unspecified 04/23/1989, 02/28/2016, 04/24/2023  PFIZER(Purple Top)SARS-COV-2 Vaccination 05/13/2019, 06/03/2019, 03/24/2020   PNEUMOCOCCAL CONJUGATE-20 02/19/2021   Pneumococcal Polysaccharide-23 06/18/2016   Tdap 08/03/2015, 09/14/2020    Past Surgical History:  Procedure Laterality Date   CHOLECYSTECTOMY     KNEE ARTHROSCOPY Right 08/23/2015   Procedure: ARTHROSCOPY KNEE, partial medial and lateral menisectomy, medial and lateral chondolar microfracture.;  Surgeon: Josephus Nida, MD;  Location: [facility] ORS;   Service: Orthopedics;  Laterality: Right;   KNEE ARTHROSCOPY Left 02/21/2016   Procedure: ARTHROSCOPY KNEE;  Surgeon: Josephus Nida, MD;  Location: [facility] ORS;  Service: Orthopedics;  Laterality: Left;   TONSILLECTOMY     TONSILLECTOMY AND ADENOIDECTOMY     UVULOPALATOPHARYNGOPLASTY     WISDOM TOOTH EXTRACTION      Social History   Tobacco Use   Smoking status: Former    Current packs/day: 0.00    Average packs/day: 1.3 packs/day for 15.0 years (20.0 ttl pk-yrs)    Types: Cigarettes    Start date: 04/30/1983    Quit date: 04/29/1993    Years since quitting: 30.3   Smokeless tobacco: Former    Types: Chew    Quit date: 05/12/2015  Vaping Use   Vaping status: Never Used  Substance Use Topics   Alcohol use: Yes    Comment: rarely   Drug use: No    Family History  Problem Relation Age of Onset   Thyroid disease Mother    Hypertension Mother    Lung cancer Father    Cancer Father    Thyroid disease Sister    Diabetes Maternal Grandmother    Heart disease Paternal Grandmother         09/02/2023    8:18 AM 07/03/2023    8:26 AM 06/05/2023    1:27 PM  GAD 7 : Generalized Anxiety Score  Nervous, Anxious, on Edge 0 0 0  Control/stop worrying 0 0 0  Worry too much - different things 0 0 0  Trouble relaxing 0 0 0  Restless 0 0 0  Easily annoyed or irritable 0 0 0  Afraid - awful might happen  0 0  Total GAD 7 Score  0 0  Anxiety Difficulty Not difficult at all Not difficult at all Not difficult at all       09/02/2023    8:18 AM 07/03/2023    8:26 AM 06/05/2023    1:27 PM  Depression screen PHQ 2/9  Decreased Interest 1 0 0  Down, Depressed, Hopeless 0 0 0  PHQ - 2 Score 1 0 0    BP Readings from Last 3 Encounters:  09/02/23 (!) 144/80  07/07/23 (!) 160/104  07/03/23 (!) 146/90    Wt Readings from Last 3 Encounters:  09/02/23 (!) 323 lb (146.5 kg)  07/07/23 (!) 330 lb (149.7 kg)  07/03/23 (!) 334 lb (151.5 kg)    BP (!) 144/80 (BP Location: Left Arm, Cuff Size:  Large)   Pulse 69   Temp (!) 97.5 F (36.4 C)   Ht 6' (1.829 m)   Wt (!) 323 lb (146.5 kg)   SpO2 98%   BMI 43.81 kg/m   Physical Exam Vitals and nursing note reviewed.  Constitutional:      Appearance: Normal appearance. He is obese.  Cardiovascular:     Rate and Rhythm: Normal rate.  Pulmonary:     Effort: Pulmonary effort is normal.  Abdominal:     General: There is no distension.  Musculoskeletal:        General: Normal range  of motion.  Skin:    General: Skin is warm and dry.  Neurological:     Mental Status: He is alert and oriented to person, place, and time.     Gait: Gait is intact.  Psychiatric:        Mood and Affect: Mood and affect normal.     Recent Labs     Component Value Date/Time   NA 139 06/06/2023 1451   NA 140 04/07/2014 1614   K 4.2 06/06/2023 1451   K 3.8 04/07/2014 1614   CL 101 06/06/2023 1451   CL 108 (H) 04/07/2014 1614   CO2 23 06/06/2023 1451   CO2 22 04/07/2014 1614   GLUCOSE 227 (H) 06/06/2023 1451   GLUCOSE 179 (H) 02/28/2017 1358   GLUCOSE 146 (H) 04/07/2014 1614   BUN 13 06/06/2023 1451   BUN 16 04/07/2014 1614   CREATININE 0.66 (L) 06/06/2023 1451   CREATININE 1.51 (H) 04/07/2014 1614   CALCIUM 8.7 06/06/2023 1451   CALCIUM 8.8 04/07/2014 1614   PROT 6.9 06/06/2023 1451   PROT 8.5 (H) 04/07/2014 1614   ALBUMIN 4.1 06/06/2023 1451   ALBUMIN 3.7 04/07/2014 1614   AST 23 06/06/2023 1451   AST 33 04/07/2014 1614   ALT 32 06/06/2023 1451   ALT 65 (H) 04/07/2014 1614   ALKPHOS 102 06/06/2023 1451   ALKPHOS 118 (H) 04/07/2014 1614   BILITOT 0.6 06/06/2023 1451   BILITOT 0.9 04/07/2014 1614   GFRNONAA >60 02/28/2017 1358   GFRNONAA 54 (L) 04/07/2014 1614   GFRNONAA >60 07/01/2013 1441   GFRAA >60 02/28/2017 1358   GFRAA >60 04/07/2014 1614   GFRAA >60 07/01/2013 1441    Lab Results  Component Value Date   WBC 11.6 (H) 06/06/2023   HGB 13.9 06/06/2023   HCT 40.2 06/06/2023   MCV 89 06/06/2023   PLT 280 06/06/2023    Lab Results  Component Value Date   HGBA1C 7.6 (A) 09/02/2023   Lab Results  Component Value Date   CHOL 129 06/06/2023   HDL 35 (L) 06/06/2023   LDLCALC 57 06/06/2023   TRIG 229 (H) 06/06/2023   CHOLHDL 3.7 06/06/2023   Lab Results  Component Value Date   TSH 1.770 06/06/2023   Lab Results  Component Value Date   HGBA1C 7.6 (A) 09/02/2023   HGBA1C 8.7 (A) 06/05/2023    Assessment and Plan:  Type 2 diabetes mellitus with hyperlipidemia (HCC) Assessment & Plan: A1c 7.6% today, improved from 8.7% previously.  Doing well with the current medication regimen, continue Rybelsus  and metformin.  Will also be referring to dietitian services.  Orders: -     POCT glycosylated hemoglobin (Hb A1C) -     Amb ref to Medical Nutrition Therapy-MNT  Primary hypertension Assessment & Plan: Inadequate control in office today, but he did not take his medications this morning.  Encouraged home BP monitoring at least twice weekly with a log for my review next time, otherwise continue medications as prescribed.  Orders: -     Amb ref to Medical Nutrition Therapy-MNT  Class 3 severe obesity with serious comorbidity and body mass index (BMI) of 40.0 to 44.9 in adult, unspecified obesity type -     Amb ref to Medical Nutrition Therapy-MNT  Long term current use of oral hypoglycemic drug     Return in about 3 months (around 12/03/2023) for OV f/u chronic conditions.    Cody Das, PA-C, DMSc, Nutritionist [facility]  Medicine [facility] [phone]

## 2023-09-02 NOTE — Assessment & Plan Note (Signed)
 Inadequate control in office today, but he did not take his medications this morning.  Encouraged home BP monitoring at least twice weekly with a log for my review next time, otherwise continue medications as prescribed.

## 2023-09-02 NOTE — Assessment & Plan Note (Signed)
 A1c 7.6% today, improved from 8.7% previously.  Doing well with the current medication regimen, continue Rybelsus  and metformin.  Will also be referring to dietitian services.

## 2023-09-15 DIAGNOSIS — D352 Benign neoplasm of pituitary gland: Secondary | ICD-10-CM | POA: Diagnosis not present

## 2023-09-15 DIAGNOSIS — Z1331 Encounter for screening for depression: Secondary | ICD-10-CM | POA: Diagnosis not present

## 2023-09-18 DIAGNOSIS — D352 Benign neoplasm of pituitary gland: Secondary | ICD-10-CM | POA: Diagnosis not present

## 2023-10-03 ENCOUNTER — Other Ambulatory Visit: Payer: Self-pay | Admitting: Physician Assistant

## 2023-10-03 DIAGNOSIS — E1169 Type 2 diabetes mellitus with other specified complication: Secondary | ICD-10-CM

## 2023-10-03 NOTE — Telephone Encounter (Signed)
 Requested medications are due for refill today.  yes  Requested medications are on the active medications list.  yes  Last refill. 06/16/2023 #30 1 rf  Future visit scheduled.   yes  Notes to clinic.  Medication not assigned to a protocol. Please review for refill.    Requested Prescriptions  Pending Prescriptions Disp Refills   RYBELSUS  7 MG TABS [Pharmacy Med Name: RYBELSUS  7 MG TABLET] 30 tablet 1    Sig: TAKE 1 TABLET BY MOUTH EVERY DAY     Off-Protocol Failed - 10/03/2023  2:28 PM      Failed - Medication not assigned to a protocol, review manually.      Passed - Valid encounter within last 12 months    Recent Outpatient Visits           1 month ago Type 2 diabetes mellitus with hyperlipidemia (HCC)   [facility] at [facility], Arleen Lacer, [place]   2 months ago Sacroiliac joint dysfunction of left side    [facility] at [facility], Dessie Flow, [place]   3 months ago Sciatica, left side   [facility] at [facility], [place]leen [place], [place]   4 months ago Type 2 diabetes mellitus with hyperlipidemia [facility])   [facility] at [facility], [place]leen [place], [place]

## 2023-10-13 NOTE — Progress Notes (Signed)
 Diabetes Self-Management Education  Visit Type: First/Initial  Appt. Start Time: 1035 Appt. End Time: 1139  10/20/2023  Mr. Jacob [name], identified by name and date of birth, is a 53 y.o. male with a diagnosis of Diabetes: Type 2.   ASSESSMENT  Patient is here today alone. Patient would like to learn about diabetes management. Patient reports he does the shopping and cooking. Pt reports monitoring blood sugar about once weekly SMBG and no values reported per Pt. Pt reports he has made the following changes including eating less and walking his dog for physical activity. Pt reports he is working full time mostly sitting. Pt reports he eats out 7-8 times weekly. All Pt's questions were answered during this encounter.   History includes:   Past Medical History:  Diagnosis Date   Anxiety    Atypical mole    Depression    Diabetes (HCC)    Gout    Heart murmur    Hyperlipidemia    Hypertension    Obesity    Pituitary adenoma (HCC)    Sleep apnea    uses cpap    Medications include:   Current Outpatient Medications:    allopurinol (ZYLOPRIM) 100 MG tablet, Take 100 mg by mouth at bedtime. , Disp: , Rfl:    amLODipine (NORVASC) 5 MG tablet, Take by mouth., Disp: , Rfl:    aspirin  EC 81 MG tablet, Take 81 mg by mouth daily., Disp: , Rfl:    atorvastatin (LIPITOR) 20 MG tablet, , Disp: , Rfl:    FLUoxetine (PROZAC) 20 MG capsule, Take 20 mg by mouth at bedtime. , Disp: , Rfl:    fluticasone (FLONASE) 50 MCG/ACT nasal spray, Place into both nostrils daily., Disp: , Rfl:    metFORMIN (GLUMETZA) 500 MG (MOD) 24 hr tablet, Take 1,500 mg at bedtime by mouth. , Disp: , Rfl:    Semaglutide  (RYBELSUS ) 7 MG TABS, TAKE 1 TABLET BY MOUTH EVERY DAY, Disp: 90 tablet, Rfl: 1   Ascorbic Acid (VITA-C PO), Take by mouth., Disp: , Rfl:    glucose blood test strip, Use as instructed, Disp: 100 each, Rfl: 5   lisinopril (ZESTRIL) 20 MG tablet, Take 20 mg by mouth every morning., Disp: , Rfl:     ONETOUCH DELICA LANCETS FINE MISC, , Disp: , Rfl:    Turmeric (QC TUMERIC COMPLEX PO), Take by mouth., Disp: , Rfl:    Labs noted:   Lab Results  Component Value Date   HGBA1C 7.6 (A) 09/02/2023   Lab Results  Component Value Date   CHOL 129 06/06/2023   HDL 35 (L) 06/06/2023   LDLCALC 57 06/06/2023   TRIG 229 (H) 06/06/2023   CHOLHDL 3.7 06/06/2023   There were no vitals taken for this visit. There is no height or weight on file to calculate BMI.   Diabetes Self-Management Education - 10/20/23 1048       Visit Information   Visit Type First/Initial      Initial Visit   Diabetes Type Type 2    Date Diagnosed unknonw    Are you currently following a meal plan? No    Are you taking your medications as prescribed? Yes      Health Coping   How would you rate your overall health? Fair      Psychosocial Assessment   Patient Belief/Attitude about Diabetes Motivated to manage diabetes    What is the hardest part about your diabetes right now, causing you the most concern,  or is the most worrisome to you about your diabetes?   Other (comment)   N/a   Self-care barriers None    Self-management support Doctor's office    Other persons present Patient    Patient Concerns Other (comment)    Special Needs None    Preferred Learning Style Hands on    Learning Readiness Change in progress    How often do you need to have someone help you when you read instructions, pamphlets, or other written materials from your doctor or pharmacy? 1 - Never    What is the last grade level you completed in school? 12th      Pre-Education Assessment   Patient understands the diabetes disease and treatment process. Needs Instruction    Patient understands incorporating nutritional management into lifestyle. Needs Instruction    Patient undertands incorporating physical activity into lifestyle. Needs Instruction    Patient understands using medications safely. Needs Instruction    Patient understands  monitoring blood glucose, interpreting and using results Needs Instruction    Patient understands prevention, detection, and treatment of acute complications. Needs Instruction    Patient understands prevention, detection, and treatment of chronic complications. Needs Instruction    Patient understands how to develop strategies to address psychosocial issues. Needs Instruction    Patient understands how to develop strategies to promote health/change behavior. Needs Instruction      Complications   Last HgB A1C per patient/outside source 7.6 %    How often do you check your blood sugar? 0 times/day (not testing)    Have you had a dilated eye exam in the past 12 months? Yes    Have you had a dental exam in the past 12 months? No    Are you checking your feet? Yes    How many days per week are you checking your feet? 7      Dietary Intake   Breakfast [employer] of ensure protein shake or gravy biscuit or ham and egg mcmuffin or    Lunch skips 4-5/d/w or cheeseburger or fried or grilled chicken sandwich, water or diet dr pepper    Snack (afternoon) pretzel    Dinner protein shake or baked chicken, brussel sprouts, corn or baked pork chop, broccoli, asparagus, potato with butter, flavored water    Snack (evening) whole milk    Beverage(s) mio, water, diet dr pepper      Activity / Exercise   Activity / Exercise Type Light (walking / raking leaves)    How many days per week do you exercise? 4    How many minutes per day do you exercise? 45    Total minutes per week of exercise 180      Patient Education   Previous Diabetes Education No    Disease Pathophysiology Definition of diabetes, type 1 and 2, and the diagnosis of diabetes    Healthy Eating Plate Method;Food label reading, portion sizes and measuring food.;Role of diet in the treatment of diabetes and the relationship between the three main macronutrients and blood glucose level;Reviewed blood glucose goals for pre and post meals and how  to evaluate the patients' food intake on their blood glucose level.;Meal timing in regards to the patients' current diabetes medication.    Being Active Role of exercise on diabetes management, blood pressure control and cardiac health.    Medications Reviewed patients medication for diabetes, action, purpose, timing of dose and side effects.    Monitoring Daily foot exams;Identified appropriate SMBG and/or A1C  goals.;Purpose and frequency of SMBG.    Acute complications Taught prevention, symptoms, and  treatment of hypoglycemia - the 15 rule.    Chronic complications Relationship between chronic complications and blood glucose control    Diabetes Stress and Support Role of stress on diabetes    Lifestyle and Health Coping Lifestyle issues that need to be addressed for better diabetes care      Individualized Goals (developed by patient)   Nutrition Follow meal plan discussed    Physical Activity Exercise 3-5 times per week;45 minutes per day;Exercise 5-7 days per week    Medications take my medication as prescribed    Monitoring  Test my blood glucose as discussed    Problem Solving Addressing barriers to behavior change    Reducing Risk do foot checks daily    Health Coping Ask for help with psychological, social, or emotional issues      Post-Education Assessment   Patient understands the diabetes disease and treatment process. Comprehends key points    Patient understands incorporating nutritional management into lifestyle. Comprehends key points    Patient undertands incorporating physical activity into lifestyle. Comprehends key points    Patient understands using medications safely. Comphrehends key points    Patient understands monitoring blood glucose, interpreting and using results Needs Review    Patient understands prevention, detection, and treatment of acute complications. Comprehends key points    Patient understands prevention, detection, and treatment of chronic  complications. Needs Review    Patient understands how to develop strategies to address psychosocial issues. Needs Review    Patient understands how to develop strategies to promote health/change behavior. Needs Review      Outcomes   Expected Outcomes Demonstrated interest in learning. Expect positive outcomes    Future DMSE 3-4 months    Program Status Not Completed          Individualized Plan for Diabetes Self-Management Training:   Learning Objective:  Patient will have a greater understanding of diabetes self-management. Patient education plan is to attend individual and/or group sessions per assessed needs and concerns.   Plan:   Patient Instructions  Avoid skipping meals; consider a balanced snack or meal replacement in replace of a skipped meal.  Continue with physical activity-Great job!   Expected Outcomes:  Demonstrated interest in learning. Expect positive outcomes  Education material provided: ADA - How to Thrive: A Guide for Your Journey with Diabetes, My Plate, Snack sheet, Support group flyer, and Diabetes Resources  If problems or questions, patient to contact team via:  Phone  Future DSME appointment: 3-4 months

## 2023-10-20 ENCOUNTER — Encounter: Payer: Self-pay | Admitting: Dietician

## 2023-10-20 ENCOUNTER — Encounter: Attending: Physician Assistant | Admitting: Dietician

## 2023-10-20 DIAGNOSIS — Z713 Dietary counseling and surveillance: Secondary | ICD-10-CM | POA: Diagnosis not present

## 2023-10-20 DIAGNOSIS — E66813 Obesity, class 3: Secondary | ICD-10-CM | POA: Insufficient documentation

## 2023-10-20 DIAGNOSIS — E1169 Type 2 diabetes mellitus with other specified complication: Secondary | ICD-10-CM | POA: Diagnosis not present

## 2023-10-20 DIAGNOSIS — I10 Essential (primary) hypertension: Secondary | ICD-10-CM | POA: Diagnosis not present

## 2023-10-20 DIAGNOSIS — Z6841 Body Mass Index (BMI) 40.0 and over, adult: Secondary | ICD-10-CM | POA: Diagnosis not present

## 2023-10-20 NOTE — Patient Instructions (Addendum)
 Avoid skipping meals; consider a balanced snack or meal replacement in replace of a skipped meal.  Continue with physical activity-Great job!

## 2023-10-27 NOTE — Progress Notes (Signed)
 [facility] Quality Team Note  Name: LUCHIANO VISCOMI Date of Birth: 1970-07-31 MRN: [id number] Date: 10/27/2023  [facility] Quality Team has reviewed this patient's chart, please see recommendations below:  [facility] Quality Other; (CHART REVIEWED FOR HEMOGLOBIN A1C. ABSTRACTED MOST RECENT VALUE)

## 2023-11-07 ENCOUNTER — Other Ambulatory Visit: Payer: Self-pay | Admitting: Internal Medicine

## 2023-11-07 DIAGNOSIS — D352 Benign neoplasm of pituitary gland: Secondary | ICD-10-CM

## 2023-12-03 ENCOUNTER — Encounter: Payer: Self-pay | Admitting: Physician Assistant

## 2023-12-03 ENCOUNTER — Ambulatory Visit (INDEPENDENT_AMBULATORY_CARE_PROVIDER_SITE_OTHER): Admitting: Physician Assistant

## 2023-12-03 VITALS — BP 154/80 | HR 83 | Temp 98.5°F | Ht 72.0 in | Wt 315.0 lb

## 2023-12-03 DIAGNOSIS — D352 Benign neoplasm of pituitary gland: Secondary | ICD-10-CM | POA: Diagnosis not present

## 2023-12-03 DIAGNOSIS — E1169 Type 2 diabetes mellitus with other specified complication: Secondary | ICD-10-CM | POA: Diagnosis not present

## 2023-12-03 DIAGNOSIS — E66813 Obesity, class 3: Secondary | ICD-10-CM

## 2023-12-03 DIAGNOSIS — Z7984 Long term (current) use of oral hypoglycemic drugs: Secondary | ICD-10-CM | POA: Diagnosis not present

## 2023-12-03 DIAGNOSIS — E785 Hyperlipidemia, unspecified: Secondary | ICD-10-CM | POA: Diagnosis not present

## 2023-12-03 LAB — POCT GLYCOSYLATED HEMOGLOBIN (HGB A1C): Hemoglobin A1C: 5.7 % — AB (ref 4.0–5.6)

## 2023-12-03 MED ORDER — RYBELSUS 7 MG PO TABS: 0.5000 | ORAL_TABLET | Freq: Every day | ORAL | Status: DC

## 2023-12-03 NOTE — Assessment & Plan Note (Signed)
 Notes reviewed, continue specialist follow-up.

## 2023-12-03 NOTE — Assessment & Plan Note (Signed)
 Congratulated on weight loss so far.  Likely to improve metabolic comorbidities.

## 2023-12-03 NOTE — Progress Notes (Signed)
 Date:  12/03/2023   Name:  Jacob [name]   DOB:  Oct 06, 1970   MRN:  [id number]   Chief Complaint: Medical Management of Chronic Issues, Diabetes (Wants to go back to 3 MG for rybelsus  7 MG makes him not want to eat anything ), and Ear Pain (X 1.5 weeks,Left ear, comes and goes, painful to touch, happens in the mornign)  HPI Jacob [name] presents for 76-month follow-up on chronic conditions including HTN, DM2, metabolic syndrome overall doing well since our last visit when he was prescribed Rybelsus .  He is currently taking Rybelsus  7 mg daily in addition to his metformin 1500 mg daily.  Tolerating both medication well. Last visit his A1c had dropped from 8.7% to 7.6%. He completed dietician consult 10/20/23.  He is still not monitoring blood pressure at home, but has a cuff  Weight continues to decline. Down an additionaly 8 lb from our last visit 37m ago and down 22 lb in the last 85m.   Had new patient appointment with [facility] 09/15/2023 for consult on pituitary adenoma which affected prolactin and testosterone  levels but was previously determined not to be a candidate for surgical intervention. It has been about 9 years since last imaging. Updated brain MRI has been ordered by endo but not yet completed, along with a panel of labs showing low T and high prolactin. He will be doing more labs for them 12/25/23 with OV 01/01/24.  He completed trial of clomiphene for the low T, but his wife said it made him angry so he stopped.  Mentions his left ear has been bothering him for the last 10 days or so, since there is some crust in the morning but after he picks it off, it seems fine for the rest of the day.  Would be interested in dose reduction of Rybelsus , feels that his appetite is too suppressed.  Medication list has been reviewed and updated.  Current Meds  Medication Sig   allopurinol (ZYLOPRIM) 100 MG tablet Take 100 mg by mouth at bedtime.    amLODipine (NORVASC) 5 MG tablet  Take by mouth.   Ascorbic Acid (VITA-C PO) Take by mouth.   aspirin  EC 81 MG tablet Take 81 mg by mouth daily.   atorvastatin (LIPITOR) 20 MG tablet    cabergoline (DOSTINEX) 0.5 MG tablet Take by mouth.   FLUoxetine (PROZAC) 20 MG capsule Take 20 mg by mouth at bedtime.    fluticasone (FLONASE) 50 MCG/ACT nasal spray Place into both nostrils daily.   glucose blood test strip Use as instructed   hydrocortisone  2.5 % cream    lisinopril (ZESTRIL) 20 MG tablet Take 20 mg by mouth every morning.   metFORMIN (GLUMETZA) 500 MG (MOD) 24 hr tablet Take 1,500 mg at bedtime by mouth.    ONETOUCH DELICA LANCETS FINE MISC    Turmeric (QC TUMERIC COMPLEX PO) Take by mouth.   [DISCONTINUED] Semaglutide  (RYBELSUS ) 7 MG TABS TAKE 1 TABLET BY MOUTH EVERY DAY     Review of Systems  Patient Active Problem List   Diagnosis Date Noted   Long term current use of oral hypoglycemic drug 12/03/2023   Sacroiliac joint dysfunction of left side 07/07/2023   Metabolic syndrome 06/05/2023   Microalbuminuria due to type 2 diabetes mellitus (HCC) 06/05/2023   Chronic idiopathic gout involving toe without tophus 08/21/2021   Depression 08/21/2021   Sciatica 08/21/2021   Arthritis of facet joint of lumbar spine 10/19/2019   Chondromalacia of left knee  05/29/2016   Low testosterone  in male 05/14/2016   Complex tear of medial meniscus of left knee as current injury 04/12/2016   Internal derangement of knee joint, left 02/06/2016   Class 3 severe obesity with serious comorbidity in adult 08/08/2015   Ulcers of both lower legs, limited to breakdown of skin (HCC) 08/03/2015   Idiopathic gout of left foot 02/23/2014   Plantar fasciitis of left foot 11/05/2013   Type 2 diabetes mellitus with hyperlipidemia (HCC) 05/07/2013   Hearing loss 05/07/2013   Migraine 03/17/2013   HTN (hypertension) 02/22/2008   Sleep apnea 02/22/2008   Pituitary microadenoma (HCC) 02/22/2008    No Known Allergies  Immunization  History  Administered Date(s) Administered   Influenza Inj Mdck Quad Pf 01/28/2020, 02/19/2021   Influenza, Seasonal, Injecte, Preservative Fre 05/07/2013   Influenza,inj,Quad PF,6+ Mos 04/04/2017, 06/26/2018, 04/15/2019   Influenza-Unspecified 04/23/1989, 02/28/2016, 04/24/2023   PFIZER(Purple Top)SARS-COV-2 Vaccination 05/13/2019, 06/03/2019, 03/24/2020   PNEUMOCOCCAL CONJUGATE-20 02/19/2021   Pneumococcal Polysaccharide-23 06/18/2016   Tdap 08/03/2015, 09/14/2020    Past Surgical History:  Procedure Laterality Date   CHOLECYSTECTOMY     KNEE ARTHROSCOPY Right 08/23/2015   Procedure: ARTHROSCOPY KNEE, partial medial and lateral menisectomy, medial and lateral chondolar microfracture.;  Surgeon: Helayne Glenn, MD;  Location: [facility];  Service: Orthopedics;  Laterality: Right;   KNEE ARTHROSCOPY Left 02/21/2016   Procedure: ARTHROSCOPY KNEE;  Surgeon: Helayne Glenn, MD;  Location: [facility];  Service: Orthopedics;  Laterality: Left;   TONSILLECTOMY     TONSILLECTOMY AND ADENOIDECTOMY     UVULOPALATOPHARYNGOPLASTY     WISDOM TOOTH EXTRACTION      Social History   Tobacco Use   Smoking status: Former    Current packs/day: 0.00    Average packs/day: 1.3 packs/day for 15.0 years (20.0 ttl pk-yrs)    Types: Cigarettes    Start date: 04/30/1983    Quit date: 04/29/1993    Years since quitting: 30.6   Smokeless tobacco: Former    Types: Chew    Quit date: 05/12/2015  Vaping Use   Vaping status: Never Used  Substance Use Topics   Alcohol use: Yes    Comment: rarely   Drug use: No    Family History  Problem Relation Age of Onset   Thyroid disease Mother    Hypertension Mother    Lung cancer Father    Cancer Father    Thyroid disease Sister    Diabetes Maternal Grandmother    Heart disease Paternal Grandmother         12/03/2023    1:46 PM 09/02/2023    8:18 AM 07/03/2023    8:26 AM 06/05/2023    1:27 PM  GAD 7 : Generalized Anxiety Score  Nervous, Anxious, on Edge 0 0 0 0   Control/stop worrying 0 0 0 0  Worry too much - different things 0 0 0 0  Trouble relaxing 0 0 0 0  Restless 0 0 0 0  Easily annoyed or irritable 0 0 0 0  Afraid - awful might happen 0  0 0  Total GAD 7 Score 0  0 0  Anxiety Difficulty Not difficult at all Not difficult at all Not difficult at all Not difficult at all       12/03/2023    1:46 PM 10/20/2023   10:46 AM 09/02/2023    8:18 AM  Depression screen PHQ 2/9  Decreased Interest 0 0 1  Down, Depressed, Hopeless 0 0 0  PHQ -  2 Score 0 0 1    BP Readings from Last 3 Encounters:  12/03/23 (!) 154/80  09/02/23 (!) 144/80  07/07/23 (!) 160/104    Wt Readings from Last 3 Encounters:  12/03/23 (!) 315 lb (142.9 kg)  09/02/23 (!) 323 lb (146.5 kg)  07/07/23 (!) 330 lb (149.7 kg)    BP (!) 154/80 (Cuff Size: Large)   Pulse 83   Temp 98.5 F (36.9 C)   Ht 6' (1.829 m)   Wt (!) 315 lb (142.9 kg)   SpO2 97%   BMI 42.72 kg/m   Physical Exam Vitals and nursing note reviewed.  Constitutional:      Appearance: Normal appearance. He is obese.  Neck:     Vascular: No carotid bruit.  Cardiovascular:     Rate and Rhythm: Normal rate and regular rhythm.     Heart sounds: No murmur heard.    No friction rub. No gallop.  Pulmonary:     Effort: Pulmonary effort is normal.     Breath sounds: Normal breath sounds.  Abdominal:     General: There is no distension.  Musculoskeletal:        General: Normal range of motion.  Skin:    General: Skin is warm and dry.  Neurological:     Mental Status: He is alert and oriented to person, place, and time.     Gait: Gait is intact.  Psychiatric:        Mood and Affect: Mood and affect normal.     Recent Labs     Component Value Date/Time   NA 139 06/06/2023 1451   NA 140 04/07/2014 1614   K 4.2 06/06/2023 1451   K 3.8 04/07/2014 1614   CL 101 06/06/2023 1451   CL 108 (H) 04/07/2014 1614   CO2 23 06/06/2023 1451   CO2 22 04/07/2014 1614   GLUCOSE 227 (H) 06/06/2023 1451    GLUCOSE 179 (H) 02/28/2017 1358   GLUCOSE 146 (H) 04/07/2014 1614   BUN 13 06/06/2023 1451   BUN 16 04/07/2014 1614   CREATININE 0.66 (L) 06/06/2023 1451   CREATININE 1.51 (H) 04/07/2014 1614   CALCIUM 8.7 06/06/2023 1451   CALCIUM 8.8 04/07/2014 1614   PROT 6.9 06/06/2023 1451   PROT 8.5 (H) 04/07/2014 1614   ALBUMIN 4.1 06/06/2023 1451   ALBUMIN 3.7 04/07/2014 1614   AST 23 06/06/2023 1451   AST 33 04/07/2014 1614   ALT 32 06/06/2023 1451   ALT 65 (H) 04/07/2014 1614   ALKPHOS 102 06/06/2023 1451   ALKPHOS 118 (H) 04/07/2014 1614   BILITOT 0.6 06/06/2023 1451   BILITOT 0.9 04/07/2014 1614   GFRNONAA >60 02/28/2017 1358   GFRNONAA 54 (L) 04/07/2014 1614   GFRNONAA >60 07/01/2013 1441   GFRAA >60 02/28/2017 1358   GFRAA >60 04/07/2014 1614   GFRAA >60 07/01/2013 1441    Lab Results  Component Value Date   WBC 11.6 (H) 06/06/2023   HGB 13.9 06/06/2023   HCT 40.2 06/06/2023   MCV 89 06/06/2023   PLT 280 06/06/2023   Lab Results  Component Value Date   HGBA1C 5.7 (A) 12/03/2023   HGBA1C 7.6 (A) 09/02/2023   HGBA1C 8.7 (A) 06/05/2023   Lab Results  Component Value Date   CHOL 129 06/06/2023   HDL 35 (L) 06/06/2023   LDLCALC 57 06/06/2023   TRIG 229 (H) 06/06/2023   CHOLHDL 3.7 06/06/2023   Lab Results  Component Value Date  TSH 1.770 06/06/2023      Assessment and Plan:  Type 2 diabetes mellitus with hyperlipidemia (HCC) Assessment & Plan: A1c 5.7% today reflecting excellent glycemic control. Okay to halve the Rybelsus  for daily dose of 3.5 mg.   Orders: -     POCT glycosylated hemoglobin (Hb A1C) -     Rybelsus ; Take 0.5 tablets (3.5 mg total) by mouth daily.  Long term current use of oral hypoglycemic drug  Class 3 severe obesity with serious comorbidity in adult Assessment & Plan: Congratulated on weight loss so far.  Likely to improve metabolic comorbidities.   Pituitary microadenoma [facility]) Assessment & Plan: Notes reviewed, continue  specialist follow-up.      Return in about 3 months (around 03/04/2024) for OV f/u chronic conditions.    Rolan Hoyle, PA-C, DMSc, Nutritionist [facility] Primary Care and Sports Medicine [facility] [phone]

## 2023-12-03 NOTE — Assessment & Plan Note (Signed)
 A1c 5.7% today reflecting excellent glycemic control. Okay to halve the Rybelsus  for daily dose of 3.5 mg.

## 2023-12-25 ENCOUNTER — Other Ambulatory Visit: Payer: Self-pay | Admitting: Physician Assistant

## 2023-12-25 DIAGNOSIS — R7989 Other specified abnormal findings of blood chemistry: Secondary | ICD-10-CM | POA: Diagnosis not present

## 2023-12-25 DIAGNOSIS — D352 Benign neoplasm of pituitary gland: Secondary | ICD-10-CM | POA: Diagnosis not present

## 2023-12-25 DIAGNOSIS — D649 Anemia, unspecified: Secondary | ICD-10-CM

## 2023-12-25 NOTE — Telephone Encounter (Unsigned)
 Copied from CRM [id number]. Topic: Clinical - Medication Refill >> Dec 25, 2023  3:06 PM Carlatta H wrote: Medication:  amLODipine (NORVASC) 5 MG tablet (Expired) atorvastatin  (LIPITOR) 20 MG table FLUoxetine (PROZAC) 20 MG capsule   Has the patient contacted their pharmacy? No (Agent: If no, request that the patient contact the pharmacy for the refill. If patient does not wish to contact the pharmacy document the reason why and proceed with request.) (Agent: If yes, when and what did the pharmacy advise?)  This is the patient's preferred pharmacy:    [facility] [address], [place] - [address] [place] [address] Phone: [phone] Fax: [phone]    Is this the correct pharmacy for this prescription? Yes If no, delete pharmacy and type the correct one.   Has the prescription been filled recently? No  Is the patient out of the medication? Yes  Has the patient been seen for an appointment in the last year OR does the patient have an upcoming appointment? Yes  Can we respond through MyChart? No  Agent: Please be advised that Rx refills may take up to 3 business days. We ask that you follow-up with your pharmacy.

## 2023-12-25 NOTE — Telephone Encounter (Unsigned)
 Copied from CRM [id number]. Topic: Clinical - Medication Refill >> Dec 25, 2023  9:18 AM Charlet HERO wrote: Medication: atorvastatin  (LIPITOR) 20 MG tablet  Has the patient contacted their pharmacy? Yes Sent to wrong   This is the patient's preferred pharmacy:    [facility] [address] [place] - [address] [place] [address] Phone: [phone] Fax: [phone]   Is this the correct pharmacy for this prescription? Yes If no, delete pharmacy and type the correct one.   Has the prescription been filled recently? Yes  Is the patient out of the medication? Yes  Has the patient been seen for an appointment in the last year OR does the patient have an upcoming appointment? Yes  Can we respond through MyChart? No  Agent: Please be advised that Rx refills may take up to 3 business days. We ask that you follow-up with your pharmacy.

## 2023-12-26 MED ORDER — ATORVASTATIN CALCIUM 20 MG PO TABS: 20.0000 mg | ORAL_TABLET | Freq: Every day | ORAL | 1 refills | Status: AC

## 2023-12-26 NOTE — Telephone Encounter (Signed)
 Requested medication (s) are due for refill today: yes  Requested medication (s) are on the active medication list:hx med   Last refill:  08/19/16  Future visit scheduled: no  Notes to clinic:  hx provider   Requested Prescriptions  Pending Prescriptions Disp Refills   atorvastatin  (LIPITOR) 20 MG tablet       Cardiovascular:  Antilipid - Statins Failed - 12/26/2023  2:55 PM      Failed - Lipid Panel in normal range within the last 12 months    Cholesterol, Total  Date Value Ref Range Status  06/06/2023 129 100 - 199 mg/dL Final   LDL Chol Calc (NIH)  Date Value Ref Range Status  06/06/2023 57 0 - 99 mg/dL Final   HDL  Date Value Ref Range Status  06/06/2023 35 (L) >39 mg/dL Final   Triglycerides  Date Value Ref Range Status  06/06/2023 229 (H) 0 - 149 mg/dL Final         Passed - Patient is not pregnant      Passed - Valid encounter within last 12 months    Recent Outpatient Visits           3 weeks ago Type 2 diabetes mellitus with hyperlipidemia (HCC)   [place] [facility] at [facility], Toribio SQUIBB, [place]   3 months ago Type 2 diabetes mellitus with hyperlipidemia [facility])   [place] [facility] at [facility], Toribio SQUIBB, [place]   5 months ago Sacroiliac joint dysfunction of left side   [place] [facility] at [facility], Selinda PARAS, MD   5 months ago Sciatica, left side   [facility] at [facility], Toribio SQUIBB, [place]   6 months ago Type 2 diabetes mellitus with hyperlipidemia [facility])   [facility] & [facility] Medicine at [facility], Toribio SQUIBB, [place]

## 2023-12-26 NOTE — Telephone Encounter (Signed)
 Please review.  KP

## 2024-01-01 DIAGNOSIS — D352 Benign neoplasm of pituitary gland: Secondary | ICD-10-CM | POA: Diagnosis not present

## 2024-01-01 DIAGNOSIS — E221 Hyperprolactinemia: Secondary | ICD-10-CM | POA: Diagnosis not present

## 2024-01-02 ENCOUNTER — Other Ambulatory Visit: Payer: Self-pay | Admitting: Internal Medicine

## 2024-01-02 ENCOUNTER — Encounter: Payer: Self-pay | Admitting: Internal Medicine

## 2024-01-02 ENCOUNTER — Other Ambulatory Visit: Payer: Self-pay

## 2024-01-02 DIAGNOSIS — D352 Benign neoplasm of pituitary gland: Secondary | ICD-10-CM

## 2024-01-02 MED ORDER — AMLODIPINE BESYLATE 5 MG PO TABS: 5.0000 mg | ORAL_TABLET | Freq: Every day | ORAL | 1 refills | Status: DC

## 2024-01-02 MED ORDER — FLUOXETINE HCL 20 MG PO CAPS: 20.0000 mg | ORAL_CAPSULE | Freq: Every day | ORAL | 0 refills | Status: DC

## 2024-01-06 ENCOUNTER — Inpatient Hospital Stay
Admission: RE | Admit: 2024-01-06 | Discharge: 2024-01-06 | Disposition: A | Discharge status: Home / Self Care | Source: Ambulatory Visit | Attending: Internal Medicine | Admitting: Internal Medicine

## 2024-01-06 DIAGNOSIS — D352 Benign neoplasm of pituitary gland: Secondary | ICD-10-CM | POA: Diagnosis not present

## 2024-01-06 MED ORDER — GADOPICLENOL 0.5 MMOL/ML IV SOLN
10.0000 mL | Freq: Once | INTRAVENOUS | Status: AC | PRN
Start: 2024-01-06 — End: 2024-01-06
  Administered 2024-01-06: 10 mL via INTRAVENOUS

## 2024-01-12 ENCOUNTER — Encounter: Payer: Self-pay | Admitting: Family Medicine

## 2024-01-12 ENCOUNTER — Other Ambulatory Visit (INDEPENDENT_AMBULATORY_CARE_PROVIDER_SITE_OTHER): Payer: Self-pay | Admitting: Radiology

## 2024-01-12 ENCOUNTER — Ambulatory Visit (INDEPENDENT_AMBULATORY_CARE_PROVIDER_SITE_OTHER): Admitting: Family Medicine

## 2024-01-12 VITALS — BP 138/70 | HR 70 | Ht 72.0 in | Wt 323.0 lb

## 2024-01-12 DIAGNOSIS — M533 Sacrococcygeal disorders, not elsewhere classified: Secondary | ICD-10-CM

## 2024-01-12 DIAGNOSIS — E113393 Type 2 diabetes mellitus with moderate nonproliferative diabetic retinopathy without macular edema, bilateral: Secondary | ICD-10-CM | POA: Diagnosis not present

## 2024-01-12 MED ORDER — TRIAMCINOLONE ACETONIDE 40 MG/ML IJ SUSP
40.0000 mg | Freq: Once | INTRAMUSCULAR | Status: AC
  Administered 2024-01-12: 40 mg via INTRA_ARTICULAR

## 2024-01-14 NOTE — Patient Instructions (Signed)
 VISIT SUMMARY:  You came in for a follow-up visit regarding your left-sided sacroiliac joint pain, which has been recurring every five months. We discussed your current pain management strategies and planned further treatment to help manage your symptoms.  YOUR PLAN:  LEFT SACROILIAC JOINT PAIN: You have been experiencing chronic pain in your left sacroiliac joint, which typically recurs every five months. Previous injections have provided relief for about five months each time. -We administered a left sacroiliac joint injection using Curve Pro today. -Start sacroiliac joint exercises 1-2 days after the injection. -Perform the exercises twice weekly initially, and reduce the frequency as your symptoms improve. -Contact us  if you feel you need further injections.

## 2024-01-14 NOTE — Progress Notes (Signed)
     Primary Care / Sports Medicine Office Visit  Patient Information:  Patient ID: Jacob [name], male DOB: 1971-03-29 Age: 53 y.o. MRN: [id number]   Jacob [name] is a pleasant 53 y.o. male presenting with the following:  Chief Complaint  Patient presents with   Back Pain    Low back pain flare up x 1 month. He has left leg pain also. He is wanting a cortisone injection. He had one in march and has helped up until this last month. He is having difficulty getting in his car do to the leg pain.     Vitals:   01/12/24 1617  BP: 138/70  Pulse: 70  SpO2: 98%   Vitals:   01/12/24 1617  Weight: (!) 323 lb (146.5 kg)  Height: 6' (1.829 m)   Body mass index is 43.81 kg/m.  No results found.   Discussed the use of AI scribe software for clinical note transcription with the patient, who gave verbal consent to proceed.   Independent interpretation of notes and tests performed by another provider:   None  Procedures performed:   Procedure:  Injection of left SI joint under ultrasound guidance. Ultrasound guidance utilized for medial to lateral approach Samsung HS60 device utilized with permanent recording / reporting. Verbal informed consent obtained and verified. Skin prepped in a sterile fashion. Ethyl chloride for topical local analgesia.  Completed without difficulty and tolerated well. Medication: triamcinolone  acetonide 40 mg/mL suspension for injection 1 mL total and 2 mL lidocaine  1% without epinephrine utilized for needle placement anesthetic Advised to contact for fevers/chills, erythema, induration, drainage, or persistent bleeding.   Pertinent History, Exam, Impression, and Recommendations:   Problem List Items Addressed This Visit     Sacroiliac joint dysfunction of left side - Primary   History of Present Illness Jacob [name] is a 53 year old male who presents for follow-up of left-sided sacroiliac joint pain.  Left sacroiliac joint pain -  Left-sided sacroiliac joint pain present for approximately one month - Pain typically recurs every five months; current episode began after six months, which is an improvement compared to prior episodes - Pain is localized to the left sacroiliac region - Previous sacroiliac joint injections have provided relief for approximately five months per injection - Pain impacts daily activities, requiring adjustments to schedule to attend appointments  Pain management strategies - Performs gluteal muscle sets while driving to alleviate pressure and manage discomfort  Physical Exam PALPATION: No tenderness to palpation on the left side. No bleeding or complications post-injection.  Assessment and Plan Left sacroiliac joint pain Chronic pain recurring every five months. Previous interventions effective for five months. Current treatment includes injection and exercises to extend relief duration. - Administer left sacroiliac joint injection under ultrasound guidance.. - Provide instructions for SI joint exercises to start 1-2 days post-injection. - Advise exercises twice weekly initially, reducing as symptoms improve. - Instruct to contact provider if further injections are needed.      Relevant Orders   US  LIMITED JOINT SPACE STRUCTURES LOW LEFT     Orders & Medications Medications:  Meds ordered this encounter  Medications   triamcinolone  acetonide (KENALOG -40) injection 40 mg   Orders Placed This Encounter  Procedures   US  LIMITED JOINT SPACE STRUCTURES LOW LEFT     No follow-ups on file.     Selinda JINNY Ku, MD, [facility]   Primary Care Sports Medicine Primary Care and Sports Medicine at [facility]

## 2024-01-14 NOTE — Assessment & Plan Note (Signed)
 History of Present Illness Jacob [name] is a 53 year old male who presents for follow-up of left-sided sacroiliac joint pain.  Left sacroiliac joint pain - Left-sided sacroiliac joint pain present for approximately one month - Pain typically recurs every five months; current episode began after six months, which is an improvement compared to prior episodes - Pain is localized to the left sacroiliac region - Previous sacroiliac joint injections have provided relief for approximately five months per injection - Pain impacts daily activities, requiring adjustments to schedule to attend appointments  Pain management strategies - Performs gluteal muscle sets while driving to alleviate pressure and manage discomfort  Physical Exam PALPATION: No tenderness to palpation on the left side. No bleeding or complications post-injection.  Assessment and Plan Left sacroiliac joint pain Chronic pain recurring every five months. Previous interventions effective for five months. Current treatment includes injection and exercises to extend relief duration. - Administer left sacroiliac joint injection under ultrasound guidance.. - Provide instructions for SI joint exercises to start 1-2 days post-injection. - Advise exercises twice weekly initially, reducing as symptoms improve. - Instruct to contact provider if further injections are needed.

## 2024-01-20 ENCOUNTER — Other Ambulatory Visit: Payer: Self-pay | Admitting: Physician Assistant

## 2024-01-20 NOTE — Telephone Encounter (Unsigned)
 Copied from CRM #[id number]. Topic: Clinical - Medication Refill >> Jan 20, 2024 12:47 PM Precious C wrote: Medication:   metFORMIN  (GLUMETZA ) 500 MG (MOD) 24 hr tablet allopurinol  (ZYLOPRIM ) 100 MG tablet lisinopril  (ZESTRIL ) 20 MG tablet fluticasone  (FLONASE ) 50 MCG/ACT nasal spray   Has the patient contacted their pharmacy? Yes, advised to contact PCP (Agent: If no, request that the patient contact the pharmacy for the refill. If patient does not wish to contact the pharmacy document the reason why and proceed with request.) (Agent: If yes, when and what did the pharmacy advise?)  This is the patient's preferred pharmacy:  [facility] [address] [place] - [address] [place] [address] Phone: [phone] Fax: [phone]   Is this the correct pharmacy for this prescription? Yes If no, delete pharmacy and type the correct one.   Has the prescription been filled recently? No  Is the patient out of the medication? Yes  Has the patient been seen for an appointment in the last year OR does the patient have an upcoming appointment? Yes  Can we respond through MyChart? Yes  Agent: Please be advised that Rx refills may take up to 3 business days. We ask that you follow-up with your pharmacy.

## 2024-01-21 MED ORDER — ALLOPURINOL 100 MG PO TABS: 100.0000 mg | ORAL_TABLET | Freq: Every day | ORAL | 1 refills | Status: AC

## 2024-01-21 MED ORDER — METFORMIN HCL ER (MOD) 500 MG PO TB24: 1500.0000 mg | ORAL_TABLET | Freq: Every day | ORAL | 1 refills | Status: DC

## 2024-01-21 MED ORDER — LISINOPRIL 20 MG PO TABS: 20.0000 mg | ORAL_TABLET | Freq: Every morning | ORAL | 1 refills | Status: AC

## 2024-01-21 MED ORDER — FLUTICASONE PROPIONATE 50 MCG/ACT NA SUSP: 2.0000 | Freq: Every day | NASAL | 5 refills | Status: AC

## 2024-01-21 NOTE — Telephone Encounter (Signed)
 Requested medication (s) are due for refill today: yes  Requested medication (s) are on the active medication list: hx meds  Last refill:  allopurinol : 11/01/2014      fluticasone : 01/14/16         metformin : 11/01/2014      lisinopril : 06/05/23  Future visit scheduled: yes  Notes to clinic:  hx provider   Requested Prescriptions  Pending Prescriptions Disp Refills   allopurinol  (ZYLOPRIM ) 100 MG tablet      Sig: Take 1 tablet (100 mg total) by mouth at bedtime.     Endocrinology:  Gout Agents - allopurinol  Failed - 01/21/2024  1:44 PM      Failed - Uric Acid in normal range and within 360 days    Uric Acid  Date Value Ref Range Status  06/06/2023 3.3 (L) 3.8 - 8.4 mg/dL Final    Comment:               Therapeutic target for gout patients: <6.0         Failed - Cr in normal range and within 360 days    Creatinine  Date Value Ref Range Status  04/07/2014 1.51 (H) 0.60 - 1.30 mg/dL Final   Creatinine, Ser  Date Value Ref Range Status  06/06/2023 0.66 (L) 0.76 - 1.27 mg/dL Final         Passed - Valid encounter within last 12 months    Recent Outpatient Visits           1 week ago Sacroiliac joint dysfunction of left side   [place] [facility], Selinda PARAS, MD   1 month ago Type 2 diabetes mellitus with hyperlipidemia [facility])   [place] [facility] at [facility], Toribio SQUIBB, [place]   4 months ago Type 2 diabetes mellitus with hyperlipidemia [facility])   [place] [facility] at [facility], Toribio SQUIBB, [place]   6 months ago Sacroiliac joint dysfunction of left side   [place] [facility] at [facility], Selinda PARAS, MD   6 months ago Sciatica, left side   [facility] at [facility], Toribio SQUIBB, [place]              Passed - CBC within normal limits and completed in the last 12 months    WBC  Date Value  Ref Range Status  06/06/2023 11.6 (H) 3.4 - 10.8 x10E3/uL Final  02/28/2017 10.4 3.8 - 10.6 K/uL Final   RBC  Date Value Ref Range Status  06/06/2023 4.52 4.14 - 5.80 x10E6/uL Final  02/28/2017 4.69 4.40 - 5.90 MIL/uL Final   Hemoglobin  Date Value Ref Range Status  06/06/2023 13.9 13.0 - 17.7 g/dL Final   Hematocrit  Date Value Ref Range Status  06/06/2023 40.2 37.5 - 51.0 % Final   MCHC  Date Value Ref Range Status  06/06/2023 34.6 31.5 - 35.7 g/dL Final  88/97/7981 64.2 32.0 - 36.0 g/dL Final   [facility]  Date Value Ref Range Status  06/06/2023 30.8 26.6 - 33.0 pg Final  02/28/2017 31.1 26.0 - 34.0 pg Final   MCV  Date Value Ref Range Status  06/06/2023 89 79 - 97 fL Final  04/07/2014 92 80 - 100 fL Final   No results found for: PLTCOUNTKUC, LABPLAT, POCPLA RDW  Date Value Ref Range Status  06/06/2023 12.5 11.6 -  15.4 % Final  04/07/2014 13.7 11.5 - 14.5 % Final          fluticasone  (FLONASE ) 50 MCG/ACT nasal spray      Sig: Place into both nostrils daily.     Ear, Nose, and Throat: Nasal Preparations - Corticosteroids Passed - 01/21/2024  1:44 PM      Passed - Valid encounter within last 12 months    Recent Outpatient Visits           1 week ago Sacroiliac joint dysfunction of left side   [place] [facility], Selinda PARAS, MD   1 month ago Type 2 diabetes mellitus with hyperlipidemia [facility])   [place] [facility] at [facility], Toribio SQUIBB, [place]   4 months ago Type 2 diabetes mellitus with hyperlipidemia [facility])   [place] [facility] at [facility], Toribio SQUIBB, [place]   6 months ago Sacroiliac joint dysfunction of left side   [place] [facility] at [facility], Selinda PARAS, MD   6 months ago Sciatica, left side   [facility] at [facility], Toribio SQUIBB, [place]                metFORMIN  (GLUMETZA ) 500 MG (MOD) 24 hr tablet      Sig: Take 3 tablets (1,500 mg total) by mouth at bedtime.     Endocrinology:  Diabetes - Biguanides Failed - 01/21/2024  1:44 PM      Failed - Cr in normal range and within 360 days    Creatinine  Date Value Ref Range Status  04/07/2014 1.51 (H) 0.60 - 1.30 mg/dL Final   Creatinine, Ser  Date Value Ref Range Status  06/06/2023 0.66 (L) 0.76 - 1.27 mg/dL Final         Failed - B12 Level in normal range and within 720 days    Vitamin B-12  Date Value Ref Range Status  08/19/2016 325 180 - 914 pg/mL Final    Comment:    (NOTE) This assay is not validated for testing neonatal or myeloproliferative syndrome specimens for Vitamin B12 levels. Performed at [facility], [address], [place], [place] [address]          Passed - HBA1C is between 0 and 7.9 and within 180 days    Hemoglobin A1C  Date Value Ref Range Status  12/03/2023 5.7 (A) 4.0 - 5.6 % Final         Passed - eGFR in normal range and within 360 days    EGFR (African American)  Date Value Ref Range Status  04/07/2014 >60 >66mL/min Final  07/01/2013 >60  Final   GFR calc Af Amer  Date Value Ref Range Status  02/28/2017 >60 >60 mL/min Final    Comment:    (NOTE) The eGFR has been calculated using the CKD EPI equation. This calculation has not been validated in all clinical situations. eGFR's persistently <60 mL/min signify possible Chronic Kidney Disease.    EGFR (Non-African Amer.)  Date Value Ref Range Status  04/07/2014 54 (L) >70mL/min Final    Comment:    eGFR values <20mL/min/1.73 m2 may be an indication of chronic kidney disease (CKD). Calculated eGFR, using the MRDR Study equation, is useful in  patients with stable renal function. The eGFR calculation will not be reliable in acutely ill patients when  serum creatinine is changing rapidly. It is not useful in patients on dialysis. The eGFR calculation may not be applicable to patients  at the low and high extremes of body sizes, pregnant women, and vegetarians.   07/01/2013 >60  Final    Comment:    eGFR values <21mL/min/1.73 m2 may be an indication of chronic kidney disease (CKD). Calculated eGFR is useful in patients with stable renal function. The eGFR calculation will not be reliable in acutely ill patients when serum creatinine is changing rapidly. It is not useful in  patients on dialysis. The eGFR calculation may not be applicable to patients at the low and high extremes of body sizes, pregnant women, and vegetarians.    GFR calc non Af Amer  Date Value Ref Range Status  02/28/2017 >60 >60 mL/min Final   eGFR  Date Value Ref Range Status  06/06/2023 112 >59 mL/min/1.73 Final         Passed - Valid encounter within last 6 months    Recent Outpatient Visits           1 week ago Sacroiliac joint dysfunction of left side   [place] [facility], Selinda PARAS, MD   1 month ago Type 2 diabetes mellitus with hyperlipidemia [facility])   [place] [facility] at [facility], Toribio SQUIBB, [place]   4 months ago Type 2 diabetes mellitus with hyperlipidemia [facility])   [place] [facility] at [facility], Toribio SQUIBB, [place]   6 months ago Sacroiliac joint dysfunction of left side   [place] [facility] at [facility], Selinda PARAS, MD   6 months ago Sciatica, left side   [facility] at [facility], Toribio SQUIBB, [place]              Passed - CBC within normal limits and completed in the last 12 months    WBC  Date Value Ref Range Status  06/06/2023 11.6 (H) 3.4 - 10.8 x10E3/uL Final  02/28/2017 10.4 3.8 - 10.6 K/uL Final   RBC  Date Value Ref Range Status  06/06/2023 4.52 4.14 - 5.80 x10E6/uL Final  02/28/2017 4.69 4.40 - 5.90 MIL/uL Final   Hemoglobin  Date Value Ref Range Status  06/06/2023  13.9 13.0 - 17.7 g/dL Final   Hematocrit  Date Value Ref Range Status  06/06/2023 40.2 37.5 - 51.0 % Final   MCHC  Date Value Ref Range Status  06/06/2023 34.6 31.5 - 35.7 g/dL Final  88/97/7981 64.2 32.0 - 36.0 g/dL Final   [facility]  Date Value Ref Range Status  06/06/2023 30.8 26.6 - 33.0 pg Final  02/28/2017 31.1 26.0 - 34.0 pg Final   MCV  Date Value Ref Range Status  06/06/2023 89 79 - 97 fL Final  04/07/2014 92 80 - 100 fL Final   No results found for: PLTCOUNTKUC, LABPLAT, POCPLA RDW  Date Value Ref Range Status  06/06/2023 12.5 11.6 - 15.4 % Final  04/07/2014 13.7 11.5 - 14.5 % Final          lisinopril  (ZESTRIL ) 20 MG tablet      Sig: Take 1 tablet (20 mg total) by mouth every morning.     Cardiovascular:  ACE Inhibitors Failed - 01/21/2024  1:44 PM      Failed - Cr in normal range and within 180 days  Creatinine  Date Value Ref Range Status  04/07/2014 1.51 (H) 0.60 - 1.30 mg/dL Final   Creatinine, Ser  Date Value Ref Range Status  06/06/2023 0.66 (L) 0.76 - 1.27 mg/dL Final         Failed - K in normal range and within 180 days    Potassium  Date Value Ref Range Status  06/06/2023 4.2 3.5 - 5.2 mmol/L Final  04/07/2014 3.8 3.5 - 5.1 mmol/L Final         Passed - Patient is not pregnant      Passed - Last BP in normal range    BP Readings from Last 1 Encounters:  01/12/24 138/70         Passed - Valid encounter within last 6 months    Recent Outpatient Visits           1 week ago Sacroiliac joint dysfunction of left side   [place] [facility], Selinda PARAS, MD   1 month ago Type 2 diabetes mellitus with hyperlipidemia [facility])   [place] [facility] at [facility], Toribio SQUIBB, [place]   4 months ago Type 2 diabetes mellitus with hyperlipidemia [facility])    Primary [facility] & Sports Medicine at [facility], Toribio SQUIBB, [place]   6 months ago Sacroiliac joint  dysfunction of left side   [facility] at [facility], Selinda PARAS, MD   6 months ago Sciatica, left side   [facility] at [facility], Toribio SQUIBB, [place]

## 2024-01-21 NOTE — Telephone Encounter (Signed)
 Please review.  KP

## 2024-01-29 DIAGNOSIS — D352 Benign neoplasm of pituitary gland: Secondary | ICD-10-CM | POA: Diagnosis not present

## 2024-03-04 ENCOUNTER — Ambulatory Visit: Admitting: Physician Assistant

## 2024-03-05 ENCOUNTER — Ambulatory Visit (INDEPENDENT_AMBULATORY_CARE_PROVIDER_SITE_OTHER): Admitting: Physician Assistant

## 2024-03-05 ENCOUNTER — Encounter: Payer: Self-pay | Admitting: Physician Assistant

## 2024-03-05 VITALS — BP 160/90 | HR 72 | Temp 97.9°F | Ht 72.0 in | Wt 299.0 lb

## 2024-03-05 DIAGNOSIS — M1A0790 Idiopathic chronic gout, unspecified ankle and foot, without tophus (tophi): Secondary | ICD-10-CM

## 2024-03-05 DIAGNOSIS — E1169 Type 2 diabetes mellitus with other specified complication: Secondary | ICD-10-CM | POA: Diagnosis not present

## 2024-03-05 DIAGNOSIS — I10 Essential (primary) hypertension: Secondary | ICD-10-CM

## 2024-03-05 DIAGNOSIS — E1129 Type 2 diabetes mellitus with other diabetic kidney complication: Secondary | ICD-10-CM | POA: Diagnosis not present

## 2024-03-05 DIAGNOSIS — F330 Major depressive disorder, recurrent, mild: Secondary | ICD-10-CM

## 2024-03-05 DIAGNOSIS — Z7984 Long term (current) use of oral hypoglycemic drugs: Secondary | ICD-10-CM

## 2024-03-05 DIAGNOSIS — Z6841 Body Mass Index (BMI) 40.0 and over, adult: Secondary | ICD-10-CM

## 2024-03-05 DIAGNOSIS — E785 Hyperlipidemia, unspecified: Secondary | ICD-10-CM

## 2024-03-05 DIAGNOSIS — R809 Proteinuria, unspecified: Secondary | ICD-10-CM

## 2024-03-05 DIAGNOSIS — E66813 Obesity, class 3: Secondary | ICD-10-CM

## 2024-03-05 LAB — POCT GLYCOSYLATED HEMOGLOBIN (HGB A1C): Hemoglobin A1C: 5.8 % — AB (ref 4.0–5.6)

## 2024-03-05 MED ORDER — RYBELSUS 7 MG PO TABS: 1.0000 | ORAL_TABLET | Freq: Every day | ORAL | 1 refills | Status: AC

## 2024-03-05 MED ORDER — AMLODIPINE BESYLATE 10 MG PO TABS: 10.0000 mg | ORAL_TABLET | Freq: Every day | ORAL | 1 refills | Status: AC

## 2024-03-05 MED ORDER — METFORMIN HCL ER (MOD) 500 MG PO TB24: 1000.0000 mg | ORAL_TABLET | Freq: Every day | ORAL | Status: AC

## 2024-03-05 MED ORDER — FLUOXETINE HCL 20 MG PO CAPS: 20.0000 mg | ORAL_CAPSULE | Freq: Every day | ORAL | 0 refills | Status: AC

## 2024-03-05 NOTE — Assessment & Plan Note (Signed)
 A1c 5.8% today reflecting excellent glycemic control. Continue Rybelsus  7 mg daily. Reduce metformin  to 1000 mg nightly.   Advised the importance of adequate protein intake on this medication as well as resistance training which will not only improve insulin sensitivity but also build muscle mass and reduce risk of atrophy.  I recommended minimum 20 min sessions 3x/wk. Advised importance of portion control and slower eating on this medication to minimize GI side effects. Avoid high fat foods as these may cause discomfort. Patient aware of potential for weight regain when eventually stopping the medication. For this reason, continued efforts toward improving lifestyle will be particularly important moving forward.

## 2024-03-05 NOTE — Assessment & Plan Note (Signed)
 Inadequate control in office today. Increase amlodipine  to 10 mg daily, watch for peripheral edema.  Encouraged home BP monitoring at least twice weekly with a log for my review next time. Short-interval follow up on this.

## 2024-03-05 NOTE — Assessment & Plan Note (Signed)
 Congratulated on weight loss so far. Continue positive lifestyle changes.  Likely to improve metabolic comorbidities.

## 2024-03-05 NOTE — Assessment & Plan Note (Signed)
Refill fluoxetine.

## 2024-03-05 NOTE — Progress Notes (Signed)
 Date:  03/05/2024   Name:  Jacob [name]   DOB:  January 06, 1971   MRN:  [id number]   Chief Complaint: Medical Management of Chronic Issues (Questions about atorvastatin  due to recall)  HPI  [name] presents for 70-month follow-up on chronic conditions including HTN, DM2, metabolic syndrome overall doing well since our last visit. He is currently taking Rybelsus  7 mg daily in addition to his metformin  1500 mg daily.  Tolerating both medication well. In recent months A1c has dropped from 8.7% -> 7.6% -> 5.7%. He completed dietician consult 10/20/23.  He is still not reliably monitoring blood pressure at home, but has a cuff  Weight continues to decline. Down an additionaly 24 lb from our last visit 19m ago and down 38 lb in the last 36m.    Medication list has been reviewed and updated.  Current Meds  Medication Sig   acetaminophen  (TYLENOL ) 500 MG tablet Take 1,000 mg by mouth.   allopurinol  (ZYLOPRIM ) 100 MG tablet Take 1 tablet (100 mg total) by mouth at bedtime.   aspirin  EC 81 MG tablet Take 81 mg by mouth daily.   atorvastatin  (LIPITOR) 20 MG tablet Take 1 tablet (20 mg total) by mouth daily.   cabergoline (DOSTINEX) 0.5 MG tablet Take by mouth.   CINNAMON PO Take by mouth.   fluticasone  (FLONASE ) 50 MCG/ACT nasal spray Place 2 sprays into both nostrils daily.   glucose blood test strip Use as instructed   hydrocortisone  2.5 % cream    lisinopril  (ZESTRIL ) 20 MG tablet Take 1 tablet (20 mg total) by mouth every morning.   ONETOUCH DELICA LANCETS FINE MISC    Turmeric (QC TUMERIC COMPLEX PO) Take by mouth.   [DISCONTINUED] amLODipine  (NORVASC ) 5 MG tablet Take 1 tablet (5 mg total) by mouth daily.   [DISCONTINUED] FLUoxetine  (PROZAC ) 20 MG capsule Take 1 capsule (20 mg total) by mouth at bedtime.   [DISCONTINUED] metFORMIN  (GLUMETZA ) 500 MG (MOD) 24 hr tablet Take 3 tablets (1,500 mg total) by mouth at bedtime.   [DISCONTINUED] Semaglutide  (RYBELSUS ) 7 MG TABS Take 0.5 tablets (3.5  mg total) by mouth daily.     Review of Systems  Patient Active Problem List   Diagnosis Date Noted   Long term current use of oral hypoglycemic drug 12/03/2023   Sacroiliac joint dysfunction of left side 07/07/2023   Metabolic syndrome 06/05/2023   Microalbuminuria due to type 2 diabetes mellitus (HCC) 06/05/2023   Chronic idiopathic gout involving toe without tophus 08/21/2021   Depression 08/21/2021   Sciatica 08/21/2021   Arthritis of facet joint of lumbar spine 10/19/2019   Chondromalacia of left knee 05/29/2016   Low testosterone  in male 05/14/2016   Complex tear of medial meniscus of left knee as current injury 04/12/2016   Internal derangement of knee joint, left 02/06/2016   Class 3 severe obesity with serious comorbidity in adult [facility]) 08/08/2015   Ulcers of both lower legs, limited to breakdown of skin (HCC) 08/03/2015   Idiopathic gout of left foot 02/23/2014   Plantar fasciitis of left foot 11/05/2013   Type 2 diabetes mellitus with hyperlipidemia (HCC) 05/07/2013   Hearing loss 05/07/2013   Migraine 03/17/2013   HTN (hypertension) 02/22/2008   Sleep apnea 02/22/2008   Pituitary microadenoma (HCC) 02/22/2008    No Known Allergies  Immunization History  Administered Date(s) Administered   Influenza Inj Mdck Quad Pf 01/28/2020, 02/19/2021   Influenza, Seasonal, Injecte, Preservative Fre 05/07/2013   Influenza,inj,Quad PF,6+ Mos 04/04/2017,  06/26/2018, 04/15/2019, 02/04/2024   Influenza-Unspecified 04/23/1989, 02/28/2016, 04/24/2023   Moderna Sars-Covid-2 Vaccination 02/04/2024   PFIZER(Purple Top)SARS-COV-2 Vaccination 05/13/2019, 06/03/2019, 03/24/2020   PNEUMOCOCCAL CONJUGATE-20 02/19/2021   Pneumococcal Polysaccharide-23 06/18/2016   Tdap 08/03/2015, 09/14/2020    Past Surgical History:  Procedure Laterality Date   CHOLECYSTECTOMY     KNEE ARTHROSCOPY Right 08/23/2015   Procedure: ARTHROSCOPY KNEE, partial medial and lateral menisectomy, medial and  lateral chondolar microfracture.;  Surgeon: Helayne Glenn, MD;  Location: [facility] ORS;  Service: Orthopedics;  Laterality: Right;   KNEE ARTHROSCOPY Left 02/21/2016   Procedure: ARTHROSCOPY KNEE;  Surgeon: Helayne Glenn, MD;  Location: [facility] ORS;  Service: Orthopedics;  Laterality: Left;   TONSILLECTOMY     TONSILLECTOMY AND ADENOIDECTOMY     UVULOPALATOPHARYNGOPLASTY     WISDOM TOOTH EXTRACTION      Social History   Tobacco Use   Smoking status: Former    Current packs/day: 0.00    Average packs/day: 1.3 packs/day for 15.0 years (20.0 ttl pk-yrs)    Types: Cigarettes    Start date: 04/30/1983    Quit date: 04/29/1993    Years since quitting: 30.8   Smokeless tobacco: Former    Types: Chew    Quit date: 05/12/2015  Vaping Use   Vaping status: Never Used  Substance Use Topics   Alcohol use: Yes    Comment: rarely   Drug use: No    Family History  Problem Relation Age of Onset   Thyroid disease Mother    Hypertension Mother    Lung cancer Father    Cancer Father    Thyroid disease Sister    Diabetes Maternal Grandmother    Heart disease Paternal Grandmother         03/05/2024    8:45 AM 12/03/2023    1:46 PM 09/02/2023    8:18 AM 07/03/2023    8:26 AM  GAD 7 : Generalized Anxiety Score  Nervous, Anxious, on Edge 0 0 0 0  Control/stop worrying 0 0 0 0  Worry too much - different things 0 0 0 0  Trouble relaxing 0 0 0 0  Restless 0 0 0 0  Easily annoyed or irritable 0 0 0 0  Afraid - awful might happen 0 0  0  Total GAD 7 Score 0 0  0  Anxiety Difficulty Not difficult at all Not difficult at all Not difficult at all Not difficult at all       03/05/2024    8:45 AM 01/12/2024    4:21 PM 12/03/2023    1:46 PM  Depression screen PHQ 2/9  Decreased Interest 0 0 0  Down, Depressed, Hopeless 0 0 0  PHQ - 2 Score 0 0 0    BP Readings from Last 3 Encounters:  03/05/24 (!) 160/90  01/12/24 138/70  12/03/23 (!) 154/80    Wt Readings from Last 3 Encounters:  03/05/24 299  lb (135.6 kg)  01/12/24 (!) 323 lb (146.5 kg)  12/03/23 (!) 315 lb (142.9 kg)    BP (!) 160/90 (Cuff Size: Large)   Pulse 72   Temp 97.9 F (36.6 C)   Ht 6' (1.829 m)   Wt 299 lb (135.6 kg)   SpO2 98%   BMI 40.55 kg/m   Physical Exam Vitals and nursing note reviewed.  Constitutional:      Appearance: Normal appearance. He is obese.  Cardiovascular:     Rate and Rhythm: Normal rate.  Pulmonary:     Effort:  Pulmonary effort is normal.  Abdominal:     General: There is no distension.  Musculoskeletal:        General: Normal range of motion.  Skin:    General: Skin is warm and dry.  Neurological:     Mental Status: He is alert and oriented to person, place, and time.     Gait: Gait is intact.  Psychiatric:        Mood and Affect: Mood and affect normal.    Diabetic Foot Exam - Simple   Simple Foot Form Diabetic Foot exam was performed with the following findings: Yes 03/05/2024  9:00 AM  Visual Inspection No deformities, no ulcerations, no other skin breakdown bilaterally: Yes Sensation Testing Intact to touch and monofilament testing bilaterally: Yes Pulse Check Posterior Tibialis and Dorsalis pulse intact bilaterally: Yes Comments     Recent Labs     Component Value Date/Time   NA 139 06/06/2023 1451   NA 140 04/07/2014 1614   K 4.2 06/06/2023 1451   K 3.8 04/07/2014 1614   CL 101 06/06/2023 1451   CL 108 (H) 04/07/2014 1614   CO2 23 06/06/2023 1451   CO2 22 04/07/2014 1614   GLUCOSE 227 (H) 06/06/2023 1451   GLUCOSE 179 (H) 02/28/2017 1358   GLUCOSE 146 (H) 04/07/2014 1614   BUN 13 06/06/2023 1451   BUN 16 04/07/2014 1614   CREATININE 0.66 (L) 06/06/2023 1451   CREATININE 1.51 (H) 04/07/2014 1614   CALCIUM  8.7 06/06/2023 1451   CALCIUM  8.8 04/07/2014 1614   PROT 6.9 06/06/2023 1451   PROT 8.5 (H) 04/07/2014 1614   ALBUMIN 4.1 06/06/2023 1451   ALBUMIN 3.7 04/07/2014 1614   AST 23 06/06/2023 1451   AST 33 04/07/2014 1614   ALT 32 06/06/2023 1451    ALT 65 (H) 04/07/2014 1614   ALKPHOS 102 06/06/2023 1451   ALKPHOS 118 (H) 04/07/2014 1614   BILITOT 0.6 06/06/2023 1451   BILITOT 0.9 04/07/2014 1614   GFRNONAA >60 02/28/2017 1358   GFRNONAA 54 (L) 04/07/2014 1614   GFRNONAA >60 07/01/2013 1441   GFRAA >60 02/28/2017 1358   GFRAA >60 04/07/2014 1614   GFRAA >60 07/01/2013 1441    Lab Results  Component Value Date   WBC 11.6 (H) 06/06/2023   HGB 13.9 06/06/2023   HCT 40.2 06/06/2023   MCV 89 06/06/2023   PLT 280 06/06/2023   Lab Results  Component Value Date   HGBA1C 5.8 (A) 03/05/2024   HGBA1C 5.7 (A) 12/03/2023   HGBA1C 7.6 (A) 09/02/2023   Lab Results  Component Value Date   CHOL 129 06/06/2023   HDL 35 (L) 06/06/2023   LDLCALC 57 06/06/2023   TRIG 229 (H) 06/06/2023   CHOLHDL 3.7 06/06/2023   Lab Results  Component Value Date   TSH 1.770 06/06/2023      Assessment and Plan:  Type 2 diabetes mellitus with hyperlipidemia (HCC) Assessment & Plan: A1c 5.8% today reflecting excellent glycemic control. Continue Rybelsus  7 mg daily. Reduce metformin  to 1000 mg nightly.   Advised the importance of adequate protein intake on this medication as well as resistance training which will not only improve insulin sensitivity but also build muscle mass and reduce risk of atrophy.  I recommended minimum 20 min sessions 3x/wk. Advised importance of portion control and slower eating on this medication to minimize GI side effects. Avoid high fat foods as these may cause discomfort. Patient aware of potential for weight regain when eventually stopping  the medication. For this reason, continued efforts toward improving lifestyle will be particularly important moving forward.  Orders: -     POCT glycosylated hemoglobin (Hb A1C) -     Rybelsus ; Take 1 tablet (7 mg total) by mouth daily.  Dispense: 90 tablet; Refill: 1 -     metFORMIN  HCl ER (MOD); Take 2 tablets (1,000 mg total) by mouth at bedtime. -     CBC with  Differential/Platelet -     Comprehensive metabolic panel with GFR -     TSH -     Lipid panel -     Uric acid -     Microalbumin / creatinine urine ratio  Microalbuminuria due to type 2 diabetes mellitus (HCC) Assessment & Plan: Repeat micral today  Orders: -     Microalbumin / creatinine urine ratio  Long term current use of oral hypoglycemic drug  Primary hypertension Assessment & Plan: Inadequate control in office today. Increase amlodipine  to 10 mg daily, watch for peripheral edema.  Encouraged home BP monitoring at least twice weekly with a log for my review next time. Short-interval follow up on this.   Orders: -     amLODIPine  Besylate; Take 1 tablet (10 mg total) by mouth daily.  Dispense: 90 tablet; Refill: 1  Class 3 severe obesity with serious comorbidity and body mass index (BMI) of 40.0 to 44.9 in adult, unspecified obesity type [facility]) Assessment & Plan: Congratulated on weight loss so far. Continue positive lifestyle changes.  Likely to improve metabolic comorbidities.  Orders: -     CBC with Differential/Platelet -     Comprehensive metabolic panel with GFR -     TSH -     Lipid panel -     Uric acid  Chronic idiopathic gout involving toe without tophus, unspecified laterality Assessment & Plan: Check uric acid with today's lab work.   Orders: -     Uric acid  Mild episode of recurrent major depressive disorder Assessment & Plan: Refill fluoxetine   Orders: -     FLUoxetine  HCl; Take 1 capsule (20 mg total) by mouth at bedtime.  Dispense: 90 capsule; Refill: 0     Return in about 4 weeks (around 04/02/2024) for OV f/u HTN.    Rolan Hoyle, PA-C, DMSc, Nutritionist [facility] Primary Care and Sports Medicine [facility] [phone]

## 2024-03-05 NOTE — Assessment & Plan Note (Signed)
 Repeat micral today

## 2024-03-05 NOTE — Assessment & Plan Note (Signed)
 Check uric acid with today's lab work.

## 2024-03-06 LAB — COMPREHENSIVE METABOLIC PANEL WITH GFR
ALT: 26 IU/L (ref 0–44)
AST: 15 IU/L (ref 0–40)
Albumin: 4.4 g/dL (ref 3.8–4.9)
Alkaline Phosphatase: 98 IU/L (ref 47–123)
BUN/Creatinine Ratio: 13 (ref 9–20)
BUN: 9 mg/dL (ref 6–24)
Bilirubin Total: 0.9 mg/dL (ref 0.0–1.2)
CO2: 25 mmol/L (ref 20–29)
Calcium: 9.3 mg/dL (ref 8.7–10.2)
Chloride: 104 mmol/L (ref 96–106)
Creatinine, Ser: 0.7 mg/dL — ABNORMAL LOW (ref 0.76–1.27)
Globulin, Total: 3 g/dL (ref 1.5–4.5)
Glucose: 108 mg/dL — ABNORMAL HIGH (ref 70–99)
Potassium: 4.6 mmol/L (ref 3.5–5.2)
Sodium: 143 mmol/L (ref 134–144)
Total Protein: 7.4 g/dL (ref 6.0–8.5)
eGFR: 109 mL/min/1.73 (ref >59)

## 2024-03-06 LAB — LIPID PANEL
Chol/HDL Ratio: 2.8 ratio (ref 0.0–5.0)
Cholesterol, Total: 113 mg/dL (ref 100–199)
HDL: 40 mg/dL (ref >39)
LDL Chol Calc (NIH): 55 mg/dL (ref 0–99)
Triglycerides: 93 mg/dL (ref 0–149)
VLDL Cholesterol Cal: 18 mg/dL (ref 5–40)

## 2024-03-06 LAB — URIC ACID: Uric Acid: 4.1 mg/dL (ref 3.8–8.4)

## 2024-03-06 LAB — CBC WITH DIFFERENTIAL/PLATELET
Basophils Absolute: 0.1 x10E3/uL (ref 0.0–0.2)
Basos: 1 %
EOS (ABSOLUTE): 0.3 x10E3/uL (ref 0.0–0.4)
Eos: 3 %
Hematocrit: 44.6 % (ref 37.5–51.0)
Hemoglobin: 14.8 g/dL (ref 13.0–17.7)
Immature Grans (Abs): 0.1 x10E3/uL (ref 0.0–0.1)
Immature Granulocytes: 1 %
Lymphocytes Absolute: 2.4 x10E3/uL (ref 0.7–3.1)
Lymphs: 23 %
MCH: 29.7 pg (ref 26.6–33.0)
MCHC: 33.2 g/dL (ref 31.5–35.7)
MCV: 89 fL (ref 79–97)
Monocytes Absolute: 0.5 x10E3/uL (ref 0.1–0.9)
Monocytes: 5 %
Neutrophils Absolute: 7.2 x10E3/uL — ABNORMAL HIGH (ref 1.4–7.0)
Neutrophils: 67 %
Platelets: 298 x10E3/uL (ref 150–450)
RBC: 4.99 x10E6/uL (ref 4.14–5.80)
RDW: 13.3 % (ref 11.6–15.4)
WBC: 10.5 x10E3/uL (ref 3.4–10.8)

## 2024-03-06 LAB — TSH: TSH: 1.5 u[IU]/mL (ref 0.450–4.500)

## 2024-03-06 LAB — MICROALBUMIN / CREATININE URINE RATIO
Creatinine, Urine: 114.3 mg/dL
Microalb/Creat Ratio: 56 mg/g{creat} — ABNORMAL HIGH (ref 0–29)
Microalbumin, Urine: 63.8 ug/mL

## 2024-03-09 ENCOUNTER — Ambulatory Visit: Payer: Self-pay | Admitting: Physician Assistant

## 2024-04-02 ENCOUNTER — Ambulatory Visit: Admitting: Physician Assistant

## 2024-04-30 ENCOUNTER — Telehealth: Payer: Self-pay | Admitting: Physician Assistant

## 2024-04-30 ENCOUNTER — Other Ambulatory Visit (HOSPITAL_COMMUNITY): Payer: Self-pay

## 2024-04-30 ENCOUNTER — Encounter: Payer: Self-pay | Admitting: Physician Assistant

## 2024-04-30 ENCOUNTER — Ambulatory Visit: Admitting: Physician Assistant

## 2024-04-30 ENCOUNTER — Telehealth: Payer: Self-pay

## 2024-04-30 VITALS — BP 140/74 | HR 67 | Temp 98.3°F | Ht 72.0 in | Wt 314.0 lb

## 2024-04-30 DIAGNOSIS — Z7984 Long term (current) use of oral hypoglycemic drugs: Secondary | ICD-10-CM

## 2024-04-30 DIAGNOSIS — E1169 Type 2 diabetes mellitus with other specified complication: Secondary | ICD-10-CM | POA: Diagnosis not present

## 2024-04-30 DIAGNOSIS — I10 Essential (primary) hypertension: Secondary | ICD-10-CM | POA: Diagnosis not present

## 2024-04-30 DIAGNOSIS — E785 Hyperlipidemia, unspecified: Secondary | ICD-10-CM

## 2024-04-30 MED ORDER — CHLORTHALIDONE 25 MG PO TABS: 12.5000 mg | ORAL_TABLET | Freq: Every day | ORAL | 1 refills | Status: AC

## 2024-04-30 NOTE — Telephone Encounter (Signed)
 Please complete prior auth for Rybelsus  7 mg. This is a continuation of therapy for DM2. He had a change in insurance (now [place]). Thanks and happy Friday!

## 2024-04-30 NOTE — Telephone Encounter (Signed)
 Please complete PA for Rybelsus . Pt has new insurance.  KP

## 2024-04-30 NOTE — Progress Notes (Signed)
 "   Date:  04/30/2024   Name:  Jacob [name]   DOB:  05/07/1970   MRN:  [id number]   Chief Complaint: Hypertension (High readings on dystolic in the 90s)  HPI  Jacob [name] returns for 2 months f/u mainly for HTN after increasing amlodipine  last visit. While the dose adjustment was well-tolerated, he is still not at goal, reporting home BP about 135-145/85-95.   Weight is up 15 lb in 2 months. Had a 3 weeks break from using Rybelsus  due to excessive cost, insurance changed recently. He still has about 7 tablets of the 7 mg Rybelsus , and restarted yesterday.   Medication list has been reviewed and updated.  Active Medications[1]   Review of Systems  Patient Active Problem List   Diagnosis Date Noted   Long term current use of oral hypoglycemic drug 12/03/2023   Sacroiliac joint dysfunction of left side 07/07/2023   Metabolic syndrome 06/05/2023   Microalbuminuria due to type 2 diabetes mellitus (HCC) 06/05/2023   Chronic idiopathic gout involving toe without tophus 08/21/2021   Depression 08/21/2021   Sciatica 08/21/2021   Arthritis of facet joint of lumbar spine 10/19/2019   Chondromalacia of left knee 05/29/2016   Low testosterone  in male 05/14/2016   Complex tear of medial meniscus of left knee as current injury 04/12/2016   Internal derangement of knee joint, left 02/06/2016   Class 3 severe obesity with serious comorbidity in adult [facility]) 08/08/2015   Ulcers of both lower legs, limited to breakdown of skin (HCC) 08/03/2015   Idiopathic gout of left foot 02/23/2014   Plantar fasciitis of left foot 11/05/2013   Type 2 diabetes mellitus with hyperlipidemia (HCC) 05/07/2013   Hearing loss 05/07/2013   Migraine 03/17/2013   HTN (hypertension) 02/22/2008   Sleep apnea 02/22/2008   Pituitary microadenoma (HCC) 02/22/2008    Allergies[2]  Immunization History  Administered Date(s) Administered   Influenza Inj Mdck Quad Pf 01/28/2020, 02/19/2021   Influenza, Seasonal, Injecte,  Preservative Fre 05/07/2013   Influenza,inj,Quad PF,6+ Mos 04/04/2017, 06/26/2018, 04/15/2019, 02/04/2024   Influenza-Unspecified 04/23/1989, 02/28/2016, 04/24/2023   Moderna Sars-Covid-2 Vaccination 02/04/2024   PFIZER(Purple Top)SARS-COV-2 Vaccination 05/13/2019, 06/03/2019, 03/24/2020   PNEUMOCOCCAL CONJUGATE-20 02/19/2021   Pneumococcal Polysaccharide-23 06/18/2016   Tdap 08/03/2015, 09/14/2020    Past Surgical History:  Procedure Laterality Date   CHOLECYSTECTOMY     KNEE ARTHROSCOPY Right 08/23/2015   Procedure: ARTHROSCOPY KNEE, partial medial and lateral menisectomy, medial and lateral chondolar microfracture.;  Surgeon: Helayne Glenn, MD;  Location: [facility] ORS;  Service: Orthopedics;  Laterality: Right;   KNEE ARTHROSCOPY Left 02/21/2016   Procedure: ARTHROSCOPY KNEE;  Surgeon: Helayne Glenn, MD;  Location: [facility] ORS;  Service: Orthopedics;  Laterality: Left;   TONSILLECTOMY     TONSILLECTOMY AND ADENOIDECTOMY     UVULOPALATOPHARYNGOPLASTY     WISDOM TOOTH EXTRACTION      Social History[3]  Family History  Problem Relation Age of Onset   Thyroid disease Mother    Hypertension Mother    Lung cancer Father    Cancer Father    Thyroid disease Sister    Diabetes Maternal Grandmother    Heart disease Paternal Grandmother         04/30/2024    3:54 PM 03/05/2024    8:45 AM 12/03/2023    1:46 PM 09/02/2023    8:18 AM  GAD 7 : Generalized Anxiety Score  Nervous, Anxious, on Edge 0 0 0 0  Control/stop worrying 0 0 0 0  Worry too  much - different things 0 0 0 0  Trouble relaxing 0 0 0 0  Restless 0 0 0 0  Easily annoyed or irritable 0 0 0 0  Afraid - awful might happen 0 0 0   Total GAD 7 Score 0 0 0   Anxiety Difficulty Not difficult at all Not difficult at all Not difficult at all Not difficult at all       04/30/2024    3:54 PM 03/05/2024    8:45 AM 01/12/2024    4:21 PM  Depression screen PHQ 2/9  Decreased Interest 0 0 0  Down, Depressed, Hopeless 0 0 0  PHQ - 2  Score 0 0 0    BP Readings from Last 3 Encounters:  04/30/24 (!) 140/74  03/05/24 (!) 160/90  01/12/24 138/70    Wt Readings from Last 3 Encounters:  04/30/24 (!) 314 lb (142.4 kg)  03/05/24 299 lb (135.6 kg)  01/12/24 (!) 323 lb (146.5 kg)    BP (!) 140/74 (Cuff Size: Large)   Pulse 67   Temp 98.3 F (36.8 C)   Ht 6' (1.829 m)   Wt (!) 314 lb (142.4 kg)   SpO2 97%   BMI 42.59 kg/m   Physical Exam Vitals and nursing note reviewed.  Constitutional:      Appearance: Normal appearance. He is obese.  Cardiovascular:     Rate and Rhythm: Normal rate and regular rhythm.     Heart sounds: No murmur heard.    No friction rub. No gallop.  Pulmonary:     Effort: Pulmonary effort is normal.     Breath sounds: Normal breath sounds.     Recent Labs     Component Value Date/Time   NA 143 03/05/2024 0925   NA 140 04/07/2014 1614   K 4.6 03/05/2024 0925   K 3.8 04/07/2014 1614   CL 104 03/05/2024 0925   CL 108 (H) 04/07/2014 1614   CO2 25 03/05/2024 0925   CO2 22 04/07/2014 1614   GLUCOSE 108 (H) 03/05/2024 0925   GLUCOSE 179 (H) 02/28/2017 1358   GLUCOSE 146 (H) 04/07/2014 1614   BUN 9 03/05/2024 0925   BUN 16 04/07/2014 1614   CREATININE 0.70 (L) 03/05/2024 0925   CREATININE 1.51 (H) 04/07/2014 1614   CALCIUM  9.3 03/05/2024 0925   CALCIUM  8.8 04/07/2014 1614   PROT 7.4 03/05/2024 0925   PROT 8.5 (H) 04/07/2014 1614   ALBUMIN 4.4 03/05/2024 0925   ALBUMIN 3.7 04/07/2014 1614   AST 15 03/05/2024 0925   AST 33 04/07/2014 1614   ALT 26 03/05/2024 0925   ALT 65 (H) 04/07/2014 1614   ALKPHOS 98 03/05/2024 0925   ALKPHOS 118 (H) 04/07/2014 1614   BILITOT 0.9 03/05/2024 0925   BILITOT 0.9 04/07/2014 1614   GFRNONAA >60 02/28/2017 1358   GFRNONAA 54 (L) 04/07/2014 1614   GFRNONAA >60 07/01/2013 1441   GFRAA >60 02/28/2017 1358   GFRAA >60 04/07/2014 1614   GFRAA >60 07/01/2013 1441    Lab Results  Component Value Date   WBC 10.5 03/05/2024   HGB 14.8  03/05/2024   HCT 44.6 03/05/2024   MCV 89 03/05/2024   PLT 298 03/05/2024   Lab Results  Component Value Date   HGBA1C 5.8 (A) 03/05/2024   HGBA1C 5.7 (A) 12/03/2023   HGBA1C 7.6 (A) 09/02/2023   Lab Results  Component Value Date   CHOL 113 03/05/2024   HDL 40 03/05/2024   LDLCALC 55  03/05/2024   TRIG 93 03/05/2024   CHOLHDL 2.8 03/05/2024   Lab Results  Component Value Date   TSH 1.500 03/05/2024      Assessment and Plan:  Primary hypertension Assessment & Plan: Still inadequately controlled. Adding chlorthalidone  today as below.   Orders: -     Chlorthalidone ; Take 0.5 tablets (12.5 mg total) by mouth daily.  Dispense: 90 tablet; Refill: 1  Type 2 diabetes mellitus with hyperlipidemia [facility]) Assessment & Plan: Patient was given an additional 30 tab sample of the 3 mg - he can take two of these per day. Submitted a new prior auth for new insurance plan.       F/u 1 mo OV HTN, DM2   Rolan Hoyle, PA-C, DMSc, DipACLM, Nutritionist [facility] Primary Care and Sports Medicine [facility] [phone]      [1]  Current Meds  Medication Sig   acetaminophen  (TYLENOL ) 500 MG tablet Take 1,000 mg by mouth.   allopurinol  (ZYLOPRIM ) 100 MG tablet Take 1 tablet (100 mg total) by mouth at bedtime.   amLODipine  (NORVASC ) 10 MG tablet Take 1 tablet (10 mg total) by mouth daily.   aspirin  EC 81 MG tablet Take 81 mg by mouth daily.   atorvastatin  (LIPITOR) 20 MG tablet Take 1 tablet (20 mg total) by mouth daily.   cabergoline (DOSTINEX) 0.5 MG tablet Take by mouth.   chlorthalidone  (HYGROTON ) 25 MG tablet Take 0.5 tablets (12.5 mg total) by mouth daily.   CINNAMON PO Take by mouth.   FLUoxetine  (PROZAC ) 20 MG capsule Take 1 capsule (20 mg total) by mouth at bedtime.   fluticasone  (FLONASE ) 50 MCG/ACT nasal spray Place 2 sprays into both nostrils daily.   glucose blood test strip Use as instructed   hydrocortisone  2.5 % cream     lisinopril  (ZESTRIL ) 20 MG tablet Take 1 tablet (20 mg total) by mouth every morning.   metFORMIN  (GLUMETZA ) 500 MG (MOD) 24 hr tablet Take 2 tablets (1,000 mg total) by mouth at bedtime.   ONETOUCH DELICA LANCETS FINE MISC    RYBELSUS  7 MG TABS Take 1 tablet (7 mg total) by mouth daily.   Turmeric (QC TUMERIC COMPLEX PO) Take by mouth.  [2] No Known Allergies [3]  Social History Tobacco Use   Smoking status: Former    Current packs/day: 0.00    Average packs/day: 1.3 packs/day for 15.0 years (20.0 ttl pk-yrs)    Types: Cigarettes    Start date: 04/30/1983    Quit date: 04/29/1993    Years since quitting: 31.0   Smokeless tobacco: Former    Types: Chew    Quit date: 05/12/2015  Vaping Use   Vaping status: Never Used  Substance Use Topics   Alcohol use: Yes    Comment: rarely   Drug use: No   "

## 2024-05-01 NOTE — Assessment & Plan Note (Signed)
 Patient was given an additional 30 tab sample of the 3 mg - he can take two of these per day. Submitted a new prior auth for new insurance plan.

## 2024-05-01 NOTE — Assessment & Plan Note (Signed)
 Still inadequately controlled. Adding chlorthalidone  today as below.

## 2024-05-03 ENCOUNTER — Other Ambulatory Visit (HOSPITAL_COMMUNITY): Payer: Self-pay

## 2024-05-03 ENCOUNTER — Telehealth: Payer: Self-pay

## 2024-05-03 NOTE — Telephone Encounter (Signed)
 Pharmacy Patient Advocate Encounter   Received notification from Physician's Office that prior authorization for Rybelsus  7MG  tablets is required/requested.   Insurance verification completed.   The patient is insured through [facility] ACA 2017.   Per test claim: PA required; PA started via CoverMyMeds. KEY [id number] . Waiting for clinical questions to populate.

## 2024-05-06 ENCOUNTER — Other Ambulatory Visit (HOSPITAL_COMMUNITY): Payer: Self-pay

## 2024-05-12 NOTE — Telephone Encounter (Signed)
 Checking on a update.  KP

## 2024-05-13 ENCOUNTER — Other Ambulatory Visit (HOSPITAL_COMMUNITY): Payer: Self-pay

## 2024-05-13 NOTE — Telephone Encounter (Signed)
 Noted  KP

## 2024-05-13 NOTE — Telephone Encounter (Signed)
 Received notification from [facility] ACA 2017 that Prior Authorization for Rybelsus  7MG  tablets has been APPROVED from 05/05/2024 to 05/05/2025

## 2024-05-13 NOTE — Telephone Encounter (Signed)
 Pharmacy Patient Advocate Encounter  Received notification from [facility] ACA 2017 that Prior Authorization for Rybelsus  7MG  tablets has been APPROVED from 05/05/2024 to 05/05/2025. Unable to obtain price due to refill too soon rejection, last fill date 05/05/2024 next available fill date 05/29/2024   PA #/Case ID/Reference #: [id number]

## 2024-05-21 ENCOUNTER — Encounter: Payer: Self-pay | Admitting: Family Medicine

## 2024-05-21 ENCOUNTER — Ambulatory Visit: Admitting: Family Medicine

## 2024-05-21 ENCOUNTER — Other Ambulatory Visit (INDEPENDENT_AMBULATORY_CARE_PROVIDER_SITE_OTHER): Admitting: Radiology

## 2024-05-21 VITALS — BP 146/74 | HR 80 | Ht 72.0 in | Wt 318.0 lb

## 2024-05-21 DIAGNOSIS — M533 Sacrococcygeal disorders, not elsewhere classified: Secondary | ICD-10-CM

## 2024-05-21 MED ORDER — DICLOFENAC SODIUM 75 MG PO TBEC: 75.0000 mg | DELAYED_RELEASE_TABLET | Freq: Two times a day (BID) | ORAL | 0 refills | Status: AC | PRN

## 2024-05-21 MED ORDER — METHYLPREDNISOLONE ACETATE 40 MG/ML IJ SUSP
40.0000 mg | Freq: Once | INTRAMUSCULAR | Status: AC
  Administered 2024-05-21: 40 mg via INTRA_ARTICULAR

## 2024-05-21 NOTE — Progress Notes (Signed)
 "    Primary Care / Sports Medicine Office Visit  Patient Information:  Patient ID: Jacob [name], male DOB: 1971/01/22 Age: 54 y.o. MRN: [id number]   Jacob [name] is a pleasant 54 y.o. male presenting with the following:  Chief Complaint  Patient presents with   Back Pain    Low back pain radiating into left leg x 3 weeks over the last week has got worse. Patient having to lift leg to get in and out of car. Patient requesting SI joint injection today.    Vitals:   05/21/24 1402  BP: (!) 146/74  Pulse: 80  SpO2: 97%   Vitals:   05/21/24 1402  Weight: (!) 318 lb (144.2 kg)  Height: 6' (1.829 m)   Body mass index is 43.13 kg/m.  No results found.   Independent interpretation of notes and tests performed by another provider:   None  Procedures performed:   Procedure:  Injection of left SI joint under ultrasound guidance. Ultrasound guidance utilized for in-plane approach from medial to lateral at left sacroiliac joinbt Samsung HS60 device utilized with permanent recording / reporting. Verbal informed consent obtained and verified. Skin prepped in a sterile fashion. Ethyl chloride for topical local analgesia.  Completed without difficulty and tolerated well. Medication: triamcinolone  acetonide 40 mg/mL suspension for injection 1 mL total and 2 mL lidocaine  1% without epinephrine utilized for needle placement anesthetic Advised to contact for fevers/chills, erythema, induration, drainage, or persistent bleeding.   Pertinent History, Exam, Impression, and Recommendations:   Discussed the use of AI scribe software for clinical note transcription with the patient, who gave verbal consent to proceed.  History of Present Illness   Jacob [name] is a 54 year old male with left sacroiliac joint dysfunction who presents with recurrence of left sacroiliac pain.  Left Sacroiliac Pain: - Recurrence of left sacroiliac pain over the past three to four weeks -  Significant pain relief following left sacroiliac joint injection in September 2025, with analgesia lasting approximately three months - Previous injection in March 2025 provided approximately six months of symptom relief before recurrence - Typically tolerates about one month of symptoms before seeking further intervention - No use of oral analgesics or pain medications prescribed by other providers - No other musculoskeletal or systemic symptoms  Gastrointestinal Symptoms: - No reflux or acid-related complaints  Occupational and Environmental Factors: - 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 Responded to corticosteroid injection previously. Short-term NSAID use acceptable with caution due to renal risks, especially with diabetes. - Administered left sacroiliac joint corticosteroid injection in clinic. - Prescribed diclofenac for short-term use; instructed to take with food and use as needed, particularly during increased activity. - Advised use of ice to the affected area as needed. - Reviewed short-term use of diclofenac due to renal risks; discontinue once injection is effective. - Confirmed pharmacy for prescription transmission. - Provided guidance on expected onset of injection effect within 1-2 days. - Advised to contact office if symptoms persist or worsen, follow up as-needed.        Assessment & Plan Sacroiliac joint dysfunction of left side  Orders:   methylPREDNISolone acetate (DEPO-MEDROL) injection 40 mg   US  Spine; Future   diclofenac (VOLTAREN) 75 MG EC tablet;  Take 1 tablet (75 mg total) by mouth 2 (two) times daily as needed.   No follow-ups on file.     Selinda JINNY Ku, MD, [facility]   Primary Care Sports Medicine Primary  Care and Sports Medicine at [facility]   "

## 2024-05-21 NOTE — Assessment & Plan Note (Signed)
" °  Orders:   methylPREDNISolone  acetate (DEPO-MEDROL ) injection 40 mg   US  Spine; Future   diclofenac  (VOLTAREN ) 75 MG EC tablet; Take 1 tablet (75 mg total) by mouth 2 (two) times daily as needed.  "

## 2024-05-21 NOTE — Patient Instructions (Addendum)
" °  VISIT SUMMARY: Today, you were seen for recurrence of left sacroiliac joint pain. We administered a corticosteroid injection and discussed pain management strategies.  YOUR PLAN: LEFT SACROILIAC JOINT DYSFUNCTION: You have chronic left sacroiliac joint dysfunction with recurrent flares. -We administered a corticosteroid injection to your left sacroiliac joint. -You are prescribed diclofenac for short-term use. Take it with food and use it as needed, especially during increased activity. -Use ice on the affected area as needed. -Discontinue diclofenac once the injection is effective to avoid renal risks. -Expect the injection to start working within 1-2 days. -Contact our office if your symptoms persist or worsen. -Follow up as needed.    Contains text generated by Abridge.   "

## 2024-06-07 ENCOUNTER — Ambulatory Visit: Admitting: Physician Assistant

## 2024-06-29 ENCOUNTER — Ambulatory Visit: Admitting: Physician Assistant
